# Patient Record
Sex: Female | Born: 1980 | Race: Black or African American | Hispanic: No | Marital: Single | Smoking: Former smoker
Health system: Southern US, Community
[De-identification: ages and names within clinical notes are randomized; demographics above are authoritative.]

## PROBLEM LIST (undated history)

## (undated) DIAGNOSIS — M329 Systemic lupus erythematosus, unspecified: Secondary | ICD-10-CM

## (undated) DIAGNOSIS — E119 Type 2 diabetes mellitus without complications: Secondary | ICD-10-CM

## (undated) DIAGNOSIS — F329 Major depressive disorder, single episode, unspecified: Secondary | ICD-10-CM

## (undated) DIAGNOSIS — N289 Disorder of kidney and ureter, unspecified: Secondary | ICD-10-CM

## (undated) DIAGNOSIS — E785 Hyperlipidemia, unspecified: Secondary | ICD-10-CM

## (undated) DIAGNOSIS — I10 Essential (primary) hypertension: Secondary | ICD-10-CM

## (undated) DIAGNOSIS — M1990 Unspecified osteoarthritis, unspecified site: Secondary | ICD-10-CM

## (undated) DIAGNOSIS — N83209 Unspecified ovarian cyst, unspecified side: Secondary | ICD-10-CM

## (undated) DIAGNOSIS — F32A Depression, unspecified: Secondary | ICD-10-CM

## (undated) DIAGNOSIS — T7840XA Allergy, unspecified, initial encounter: Secondary | ICD-10-CM

## (undated) DIAGNOSIS — N938 Other specified abnormal uterine and vaginal bleeding: Secondary | ICD-10-CM

## (undated) DIAGNOSIS — N186 End stage renal disease: Secondary | ICD-10-CM

## (undated) DIAGNOSIS — G4730 Sleep apnea, unspecified: Secondary | ICD-10-CM

## (undated) DIAGNOSIS — K760 Fatty (change of) liver, not elsewhere classified: Secondary | ICD-10-CM

## (undated) DIAGNOSIS — D649 Anemia, unspecified: Secondary | ICD-10-CM

## (undated) DIAGNOSIS — J189 Pneumonia, unspecified organism: Secondary | ICD-10-CM

## (undated) DIAGNOSIS — F419 Anxiety disorder, unspecified: Secondary | ICD-10-CM

## (undated) DIAGNOSIS — IMO0002 Reserved for concepts with insufficient information to code with codable children: Secondary | ICD-10-CM

## (undated) DIAGNOSIS — I739 Peripheral vascular disease, unspecified: Secondary | ICD-10-CM

## (undated) DIAGNOSIS — K219 Gastro-esophageal reflux disease without esophagitis: Secondary | ICD-10-CM

## (undated) HISTORY — PX: OTHER SURGICAL HISTORY: SHX169

## (undated) HISTORY — DX: Sleep apnea, unspecified: G47.30

## (undated) HISTORY — DX: Allergy, unspecified, initial encounter: T78.40XA

## (undated) HISTORY — DX: Other specified abnormal uterine and vaginal bleeding: N93.8

## (undated) HISTORY — DX: Unspecified ovarian cyst, unspecified side: N83.209

## (undated) HISTORY — DX: Major depressive disorder, single episode, unspecified: F32.9

## (undated) HISTORY — DX: Type 2 diabetes mellitus without complications: E11.9

## (undated) HISTORY — PX: COLONOSCOPY: SHX174

## (undated) HISTORY — DX: Gastro-esophageal reflux disease without esophagitis: K21.9

## (undated) HISTORY — DX: Anemia, unspecified: D64.9

## (undated) HISTORY — DX: Hyperlipidemia, unspecified: E78.5

## (undated) HISTORY — DX: Anxiety disorder, unspecified: F41.9

## (undated) HISTORY — PX: AV FISTULA PLACEMENT: SHX1204

## (undated) HISTORY — PX: UPPER GI ENDOSCOPY: SHX6162

## (undated) HISTORY — DX: Essential (primary) hypertension: I10

## (undated) HISTORY — DX: Depression, unspecified: F32.A

---

## 2002-04-28 ENCOUNTER — Other Ambulatory Visit: Admission: RE | Admit: 2002-04-28 | Discharge: 2002-04-28 | Payer: Self-pay | Admitting: Family Medicine

## 2005-07-27 DIAGNOSIS — I10 Essential (primary) hypertension: Secondary | ICD-10-CM

## 2005-07-27 HISTORY — DX: Essential (primary) hypertension: I10

## 2005-09-11 ENCOUNTER — Encounter: Admission: RE | Admit: 2005-09-11 | Discharge: 2005-09-11 | Payer: Self-pay | Admitting: Nephrology

## 2008-07-27 HISTORY — PX: TUBAL LIGATION: SHX77

## 2010-08-17 ENCOUNTER — Encounter: Payer: Self-pay | Admitting: Nephrology

## 2012-07-26 ENCOUNTER — Encounter (HOSPITAL_COMMUNITY): Payer: Self-pay | Admitting: *Deleted

## 2012-07-26 ENCOUNTER — Emergency Department (HOSPITAL_COMMUNITY)
Admission: EM | Admit: 2012-07-26 | Discharge: 2012-07-26 | Disposition: A | Discharge status: Home / Self Care | Payer: Medicare Other | Attending: Emergency Medicine | Admitting: Emergency Medicine

## 2012-07-26 DIAGNOSIS — Z79899 Other long term (current) drug therapy: Secondary | ICD-10-CM | POA: Insufficient documentation

## 2012-07-26 DIAGNOSIS — N186 End stage renal disease: Secondary | ICD-10-CM | POA: Insufficient documentation

## 2012-07-26 DIAGNOSIS — N259 Disorder resulting from impaired renal tubular function, unspecified: Secondary | ICD-10-CM | POA: Insufficient documentation

## 2012-07-26 DIAGNOSIS — Z8739 Personal history of other diseases of the musculoskeletal system and connective tissue: Secondary | ICD-10-CM | POA: Insufficient documentation

## 2012-07-26 HISTORY — DX: Disorder of kidney and ureter, unspecified: N28.9

## 2012-07-26 HISTORY — DX: Systemic lupus erythematosus, unspecified: M32.9

## 2012-07-26 HISTORY — DX: Reserved for concepts with insufficient information to code with codable children: IMO0002

## 2012-07-26 LAB — POCT I-STAT, CHEM 8
BUN: 95 mg/dL — ABNORMAL HIGH (ref 6–23)
Chloride: 101 mEq/L (ref 96–112)
Glucose, Bld: 86 mg/dL (ref 70–99)
Hemoglobin: 8.2 g/dL — ABNORMAL LOW (ref 12.0–15.0)
Potassium: 5 mEq/L (ref 3.5–5.1)

## 2012-07-26 NOTE — ED Notes (Signed)
Pt reports needing dialysis, last tx was Friday. Pt recently moved here and has not been set up with pcp. Denies sob. Having n/v/d this am.

## 2012-07-26 NOTE — ED Provider Notes (Signed)
History     CSN: [id number]  Arrival date & time 07/26/12  1221   First MD Initiated Contact with Patient 07/26/12 1300      No chief complaint on file.   (Consider location/radiation/quality/duration/timing/severity/associated sxs/prior treatment) HPI Comments: The patient recently moved to [place] from [place] and starting dialysis was delayed. She last dialyzed Friday, 07/22/13. She denies being short of breath, she denies chest pain, or edema. She is scheduled to started dialysis on Thursday (in 2 days).   The history is provided by the patient.    Past Medical History  Diagnosis Date  . Renal disorder   . Lupus     History reviewed. No pertinent past surgical history.  History reviewed. No pertinent family history.  History  Substance Use Topics  . Smoking status: Not on file  . Smokeless tobacco: Not on file  . Alcohol Use: No    OB History    Grav Para Term Preterm Abortions TAB SAB Ect Mult Living                  Review of Systems  Constitutional: Negative for fever.  Respiratory: Negative for shortness of breath.   Cardiovascular: Negative for chest pain and leg swelling.    Allergies  Benadryl; Vancomycin; and Adhesive  Home Medications   Current Outpatient Rx  Name  Route  Sig  Dispense  Refill  . CINACALCET HCL 60 MG PO TABS   Oral   Take 60 mg by mouth daily with supper.          Marland Kitchen SEVELAMER CARBONATE 800 MG PO TABS   Oral   Take 1,600-3,200 mg by mouth 5 (five) times daily. 2 tablets with snacks and 4 tablets with meals           BP 122/72  Pulse 76  Temp 97.6 F (36.4 C) (Oral)  Resp 14  SpO2 100%  Physical Exam  Constitutional: She is oriented to person, place, and time. She appears well-developed and well-nourished.  HENT:  Head: Normocephalic.  Neck: Normal range of motion. Neck supple.  Cardiovascular: Normal rate and regular rhythm.   Pulmonary/Chest: Effort normal and breath sounds normal.  Abdominal: Soft.  Bowel sounds are normal. There is no tenderness. There is no rebound and no guarding.  Musculoskeletal: Normal range of motion. She exhibits no edema.  Neurological: She is alert and oriented to person, place, and time.  Skin: Skin is warm and dry. No rash noted.  Psychiatric: She has a normal mood and affect.    ED Course  Procedures (including critical care time)  Labs Reviewed  POCT I-STAT, CHEM 8 - Abnormal; Notable for the following:    BUN 95 (*)     Creatinine, Ser 14.50 (*)     Calcium, Ion 1.04 (*)     Hemoglobin 8.2 (*)     HCT 24.0 (*)     All other components within normal limits   No results found.   No diagnosis found.  1. ESRD-HD  MDM  K+ is within a normal range. The patient has no symptoms indicating urgent need for dialysis. She has treatment scheduled and in encouraged to return to [facility] with any red flag symptoms.        Dewaine Oats, PA-C 07/26/12 1405

## 2012-07-26 NOTE — ED Notes (Signed)
NAD noted at time of d/c home 

## 2012-07-26 NOTE — ED Notes (Signed)
Pt is new in town and has not had dialysis since last Friday , pt has no complaints today other than one episode of n/v this morning which has resolved. Pt has no SOB and lings are clear

## 2012-07-27 ENCOUNTER — Encounter (HOSPITAL_COMMUNITY): Payer: Self-pay | Admitting: Nurse Practitioner

## 2012-07-27 ENCOUNTER — Emergency Department (HOSPITAL_COMMUNITY)
Admission: EM | Admit: 2012-07-27 | Discharge: 2012-07-27 | Disposition: A | Discharge status: Home / Self Care | Payer: Medicare Other | Attending: Emergency Medicine | Admitting: Emergency Medicine

## 2012-07-27 ENCOUNTER — Emergency Department (HOSPITAL_COMMUNITY): Payer: Medicare Other

## 2012-07-27 DIAGNOSIS — R109 Unspecified abdominal pain: Secondary | ICD-10-CM | POA: Insufficient documentation

## 2012-07-27 DIAGNOSIS — R197 Diarrhea, unspecified: Secondary | ICD-10-CM | POA: Insufficient documentation

## 2012-07-27 DIAGNOSIS — Z8739 Personal history of other diseases of the musculoskeletal system and connective tissue: Secondary | ICD-10-CM | POA: Insufficient documentation

## 2012-07-27 DIAGNOSIS — R112 Nausea with vomiting, unspecified: Secondary | ICD-10-CM | POA: Insufficient documentation

## 2012-07-27 DIAGNOSIS — N19 Unspecified kidney failure: Secondary | ICD-10-CM

## 2012-07-27 DIAGNOSIS — E119 Type 2 diabetes mellitus without complications: Secondary | ICD-10-CM | POA: Insufficient documentation

## 2012-07-27 DIAGNOSIS — Z79899 Other long term (current) drug therapy: Secondary | ICD-10-CM | POA: Insufficient documentation

## 2012-07-27 DIAGNOSIS — Z87448 Personal history of other diseases of urinary system: Secondary | ICD-10-CM | POA: Insufficient documentation

## 2012-07-27 DIAGNOSIS — E875 Hyperkalemia: Secondary | ICD-10-CM

## 2012-07-27 DIAGNOSIS — R0602 Shortness of breath: Secondary | ICD-10-CM | POA: Insufficient documentation

## 2012-07-27 DIAGNOSIS — I10 Essential (primary) hypertension: Secondary | ICD-10-CM | POA: Insufficient documentation

## 2012-07-27 LAB — BASIC METABOLIC PANEL
GFR calc Af Amer: 2 mL/min — ABNORMAL LOW (ref >90)
GFR calc non Af Amer: 2 mL/min — ABNORMAL LOW (ref >90)

## 2012-07-27 MED ORDER — SODIUM POLYSTYRENE SULFONATE 15 GM/60ML PO SUSP
60.0000 g | Freq: Once | ORAL | Status: DC
  Filled 2012-07-27: qty 240

## 2012-07-27 NOTE — ED Notes (Signed)
Explained several times to pt the importance of taking kayexalate but pt refused.  MD [place] notified.

## 2012-07-27 NOTE — ED Notes (Signed)
Pt reports feeling "fatigued and sluggish" today. States she thinks its because she has not had dialysis since Friday 27th. Reports she feels swollen and sob today also. Pt is able to speak in full unlabored sentences, A&Ox4. Reports she was here yesterday seeking dialysis and they would not complete it here , she just moved here and does not have a new dialysis center yet

## 2012-07-27 NOTE — ED Provider Notes (Signed)
Medical screening examination/treatment/procedure(s) were performed by non-physician practitioner and as supervising physician I was immediately available for consultation/collaboration.  Teressa Lower, MD 07/27/12 [phone]

## 2012-07-27 NOTE — ED Notes (Signed)
Patient transported to X-ray 

## 2012-07-27 NOTE — ED Provider Notes (Signed)
History     CSN: [id number]  Arrival date & time 07/27/12  [id number]   First MD Initiated Contact with Patient 07/27/12 2007      Chief Complaint  Patient presents with  . Fatigue    (Consider location/radiation/quality/duration/timing/severity/associated sxs/prior treatment) HPI Comments: Patient with a history of end-stage renal disease on dialysis presents with fatigue and some mild shortness of breath. She states that she recently moved to the area and has not had dialysis since December 27 which was this past Friday. She is scheduled to have dialysis tomorrow. She feels like she needs dialysis. She feels achy and sore all over she has some crampy abdominal pain. She's had some nausea and vomiting but hasn't had any vomiting since earlier today. She's had some loose stools. She denies any urinary symptoms. She does still produce urine and has been urinating. She denies he fevers or chills. She denies any chest pain other than she had some sharp fleeting episode when she went onto the cold earlier today. She was seen here last night hoping to get her dialysis but was not felt to need emergent dialysis and was discharged to followup tomorrow for dialysis.   Past Medical History  Diagnosis Date  . Renal disorder   . Lupus   . Diabetes mellitus without complication   . Hypertension     Past Surgical History  Procedure Date  . Cesarean section     History reviewed. No pertinent family history.  History  Substance Use Topics  . Smoking status: Not on file  . Smokeless tobacco: Not on file  . Alcohol Use: No    OB History    Grav Para Term Preterm Abortions TAB SAB Ect Mult Living                  Review of Systems  Constitutional: Positive for fatigue. Negative for fever, chills and diaphoresis.  HENT: Negative for congestion, rhinorrhea and sneezing.   Eyes: Negative.   Respiratory: Positive for shortness of breath. Negative for cough and chest tightness.   Cardiovascular:  Negative for chest pain and leg swelling.  Gastrointestinal: Positive for nausea, vomiting, abdominal pain and diarrhea. Negative for blood in stool.  Genitourinary: Negative for frequency, hematuria, flank pain and difficulty urinating.  Musculoskeletal: Negative for back pain and arthralgias.  Skin: Negative for rash.  Neurological: Negative for dizziness, speech difficulty, weakness, numbness and headaches.    Allergies  Benadryl; Vancomycin; and Adhesive  Home Medications   Current Outpatient Rx  Name  Route  Sig  Dispense  Refill  . CINACALCET HCL 60 MG PO TABS   Oral   Take 60 mg by mouth daily with supper.          Marland Kitchen SEVELAMER CARBONATE 800 MG PO TABS   Oral   Take 1,600-3,200 mg by mouth 5 (five) times daily. 2 tablets with snacks and 4 tablets with meals           BP 139/73  Pulse 77  Temp 98.2 F (36.8 C) (Oral)  Resp 22  SpO2 100%  Physical Exam  Constitutional: She is oriented to person, place, and time. She appears well-developed and well-nourished.       Patient is well-appearing sitting up in the bed with no acute distress  HENT:  Head: Normocephalic and atraumatic.  Eyes: Pupils are equal, round, and reactive to light.  Neck: Normal range of motion. Neck supple.  Cardiovascular: Normal rate, regular rhythm and normal heart sounds.  Pulmonary/Chest: Effort normal and breath sounds normal. No respiratory distress. She has no wheezes. She has no rales. She exhibits no tenderness.  Abdominal: Soft. Bowel sounds are normal. There is tenderness (Mild tenderness in the left midabdomen). There is no rebound and no guarding.  Musculoskeletal: Normal range of motion. She exhibits no edema.  Lymphadenopathy:    She has no cervical adenopathy.  Neurological: She is alert and oriented to person, place, and time.  Skin: Skin is warm and dry. No rash noted.  Psychiatric: She has a normal mood and affect.    ED Course  Procedures (including critical care  time)  Results for orders placed during the hospital encounter of [id number]  BASIC METABOLIC PANEL      Component Value Range   Sodium 138  135 - 145 mEq/L   Potassium 6.1 (*) 3.5 - 5.1 mEq/L   Chloride 99  96 - 112 mEq/L   CO2 24  19 - 32 mEq/L   Glucose, Bld 106 (*) 70 - 99 mg/dL   BUN 89 (*) 6 - 23 mg/dL   Creatinine, Ser 19.53 (*) 0.50 - 1.10 mg/dL   Calcium 8.9  8.4 - 10.5 mg/dL   GFR calc non Af Amer 2 (*) >90 mL/min   GFR calc Af Amer 2 (*) >90 mL/min   Dg Chest 2 View  07/27/2012  *RADIOLOGY REPORT*  Clinical Data: Short of breath, mid upper abdominal pain  CHEST - 2 VIEW  Comparison: None.  Findings: Metallic stents project over the left brachiocephalic, subclavian and axillary veins.  Mild cardiomegaly and pulmonary venous congestion.  There is central airway thickening and peribronchial cuffing without evidence of focal airspace consolidation.  No overt edema.  No pneumothorax or pleural effusion.  No acute osseous abnormality.  IMPRESSION:  1.  Mild cardiomegaly and pulmonary vascular congestion 2.  Central airway thickening and peribronchial cuffing as can be seen in both acute and chronic bronchitis as well as in inflammatory conditions such as asthma 3.  Metallic stents in the left brachiocephalic, subclavian and axillary veins   Original Report Authenticated By: Jacqulynn Cadet, M.D.      Date: 07/27/2012  Rate: 77  Rhythm: normal sinus rhythm  QRS Axis: normal  Intervals: normal  ST/T Wave abnormalities: normal  Conduction Disutrbances:none  Narrative Interpretation:   Old EKG Reviewed: none available     1. Renal failure   2. Hyperkalemia       MDM  Patient with end-stage renal disease his missed dialysis. She feels like she's feeling worse because she hasn't had dialysis. Her potassium is increased since her last visit yesterday. I discussed the findings with the nephrologist on call who feels that the patient can still go home and have dialysis tomorrow. He  is advised to give her 30 g of Kayexalate tonight and then have her take 30 g in the morning. Advised her to make sure that she keeps her appointment tomorrow and to return here if her symptoms worsen.        Malvin Johns, MD 07/27/12 2232

## 2012-10-12 ENCOUNTER — Other Ambulatory Visit (HOSPITAL_COMMUNITY): Payer: Self-pay | Admitting: Nephrology

## 2012-10-12 DIAGNOSIS — N186 End stage renal disease: Secondary | ICD-10-CM

## 2012-10-20 ENCOUNTER — Other Ambulatory Visit (HOSPITAL_COMMUNITY): Payer: Self-pay | Admitting: Nephrology

## 2012-10-20 ENCOUNTER — Ambulatory Visit (HOSPITAL_COMMUNITY)
Admission: RE | Admit: 2012-10-20 | Discharge: 2012-10-20 | Disposition: A | Discharge status: Home / Self Care | Payer: Medicare Other | Source: Ambulatory Visit | Attending: Nephrology | Admitting: Nephrology

## 2012-10-20 ENCOUNTER — Ambulatory Visit (HOSPITAL_COMMUNITY): Admission: RE | Admit: 2012-10-20 | Payer: Medicare Other | Source: Ambulatory Visit

## 2012-10-20 DIAGNOSIS — E119 Type 2 diabetes mellitus without complications: Secondary | ICD-10-CM | POA: Insufficient documentation

## 2012-10-20 DIAGNOSIS — Z881 Allergy status to other antibiotic agents status: Secondary | ICD-10-CM | POA: Insufficient documentation

## 2012-10-20 DIAGNOSIS — T82898A Other specified complication of vascular prosthetic devices, implants and grafts, initial encounter: Secondary | ICD-10-CM | POA: Insufficient documentation

## 2012-10-20 DIAGNOSIS — I120 Hypertensive chronic kidney disease with stage 5 chronic kidney disease or end stage renal disease: Secondary | ICD-10-CM | POA: Insufficient documentation

## 2012-10-20 DIAGNOSIS — Y832 Surgical operation with anastomosis, bypass or graft as the cause of abnormal reaction of the patient, or of later complication, without mention of misadventure at the time of the procedure: Secondary | ICD-10-CM | POA: Insufficient documentation

## 2012-10-20 DIAGNOSIS — N186 End stage renal disease: Secondary | ICD-10-CM

## 2012-10-20 DIAGNOSIS — M329 Systemic lupus erythematosus, unspecified: Secondary | ICD-10-CM | POA: Insufficient documentation

## 2012-10-20 DIAGNOSIS — Z9109 Other allergy status, other than to drugs and biological substances: Secondary | ICD-10-CM | POA: Insufficient documentation

## 2012-10-20 DIAGNOSIS — Z888 Allergy status to other drugs, medicaments and biological substances status: Secondary | ICD-10-CM | POA: Insufficient documentation

## 2012-10-20 MED ORDER — IOHEXOL 300 MG/ML  SOLN
100.0000 mL | Freq: Once | INTRAMUSCULAR | Status: AC | PRN
  Administered 2012-10-20: 1 mL via INTRAVENOUS

## 2012-10-20 NOTE — H&P (Signed)
Cindy Luna is an 32 y.o. female.   Chief Complaint: Left hand swelling HPI: ESRD with left upper extremity fistula for hemodialysis.  Multiple endovascular interventions in [place], [place].  Left upper extremity fistulogram demonstrated multiple stents in left central venous system.  There is at least one focal area of intra-stent stenosis.  Past Medical History  Diagnosis Date  . Renal disorder   . Lupus   . Diabetes mellitus without complication   . Hypertension     Past Surgical History  Procedure Laterality Date  . Cesarean section      No family history on file. Social History:  reports that she does not drink alcohol or use illicit drugs. Her tobacco history is not on file.  Allergies:  Allergies  Allergen Reactions  . Benadryl (Diphenhydramine Hcl) Hives  . Vancomycin Swelling  . Adhesive (Tape) Rash     Review of Systems  Constitutional: Negative.   Respiratory: Negative.   Cardiovascular: Negative.     Last menstrual period 10/20/2012. Physical Exam  Cardiovascular: Normal rate, regular rhythm and normal heart sounds.   Respiratory: Effort normal and breath sounds normal.     Assessment/Plan ESRD with left arm swelling. 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  Fistulogram demonstrates at least one area of intra-stent stenosis.  Discussed balloon angioplasty and stent placement with patient.  Patient is not a candidate for sedation because she does not have a ride home.  Informed consent obtained and plan for intervention without sedation.    Cindy Luna RYAN 10/20/2012, 10:53 AM

## 2012-10-20 NOTE — Procedures (Signed)
Fistulogram demonstrated intra-stent stenosis in the left central veins.  Balloon angioplasty was performed with 7 mm, 9 mm and 10 mm balloons.  Improved flow through stents after angioplasty.  No immediate complication.

## 2014-11-14 ENCOUNTER — Ambulatory Visit
Admit: 2014-11-14 | Disposition: A | Discharge status: Home / Self Care | Payer: Self-pay | Attending: Vascular Surgery | Admitting: Vascular Surgery

## 2014-11-19 DIAGNOSIS — M329 Systemic lupus erythematosus, unspecified: Secondary | ICD-10-CM | POA: Insufficient documentation

## 2014-11-19 DIAGNOSIS — N186 End stage renal disease: Secondary | ICD-10-CM | POA: Insufficient documentation

## 2014-11-19 DIAGNOSIS — IMO0002 Reserved for concepts with insufficient information to code with codable children: Secondary | ICD-10-CM | POA: Insufficient documentation

## 2014-11-19 DIAGNOSIS — Z992 Dependence on renal dialysis: Secondary | ICD-10-CM | POA: Insufficient documentation

## 2014-11-25 NOTE — Op Note (Signed)
PATIENT NAME:  Cindy Luna, BANDO MR#:  [id number] DATE OF BIRTH:  [birth year]  DATE OF PROCEDURE:  11/14/2014  PREOPERATIVE DIAGNOSES:  1. Complication of dialysis access with tender aneurysmal deterioration of the fistula.  2. Superior vena cava syndrome.  3. Complication of vascular device with fracture and occlusion of central venous stent.  4. End-stage renal disease.  5. Morbid obesity.   POSTOPERATIVE DIAGNOSES:   1. Complication of dialysis access with tender aneurysmal deterioration of the fistula.  2. Superior vena cava syndrome.  3. Complication of vascular device with fracture and occlusion of central venous stent.  4. End-stage renal disease.  5. Morbid obesity.   PROCEDURES PERFORMED:  1. Contrast injection of the left brachiocephalic fistula with central venography.  2. Introduction catheter into right jugular vein, first order venous catheter placement.   SURGEON:  Katha Cabal, MD.     SEDATION:  Versed plus fentanyl.   CONTRAST USED:  Isovue 25 mL.   FLUOROSCOPY TIME:  7.3 minutes.   INDICATIONS:  [name] is a 34 year old woman who presented to the office for evaluation of her fistula which has become increasingly painful.  It has substantial aneurysmal deterioration. I have recommended angiography for evaluation and possible intervention.  Risks and benefits are reviewed.  All questions answered.  The patient agrees to proceed.   DESCRIPTION OF PROCEDURE:  The patient is taken to the special procedure suite, placed in the supine position, and her left arm is extended palm upward.  The left arm is prepped and draped in sterile fashion.  Prior to the procedure, she has been given 10 mg of Versed p.o.  They were unable to obtain an IV.   Lidocaine 1% is infiltrated into the skin overlying the fistula and access to the fistula is obtained with a microneedle, microwire followed by microsheath, J-wire followed by a 6-French sheath. Subsequently 1 mg of Versed plus 50  mcg of fentanyl were administered.  This yielded wonderful sedation.   Hand injection of contrast is then used to demonstrate the fistula itself as well as central veins; however, the central venous anatomy is not well documented given the large aneurysmal areas and the dilution of the contrast.  Therefore, a Glidewire and a KMP catheter are negotiated first to the stent at the confluence of the cephalic and subclavian vein and then to the innominate stent and through the innominate stent into the SVC.  Serial imaging is performed with hand injection at all these levels.     Magnified imaging of the stents is also done.  The innominate stent is noted to be subtotally occluded but also to have a rather extensive fracture with significant deformity of the stent itself.   Having documented the left-sided central venous anatomy as well as the anatomy of the fistula,  concern regarding preserving future dialysis access is raised and therefore using the Kumpe catheter and an Advantage wire, the wire is negotiated up into the jugular to the level of the angle of the jaw, and subsequently a straight Glidecatheter is tracked over this wire to this level.  With the tip of the catheter at the level of the angle of the jaw on the right side, hand injection of contrast is used to demonstrate the jugular system as well as the right innominate system.  Once these have been imaged, the catheter is removed.  Pursestring suture is placed around the sheath. Sheath is removed.  There are no immediate complications.   INTERPRETATION:  The  fistula is noted to have profoundly large aneurysms measuring 4-6 mm in diameter; however, they are patent.  There are no strictures or stenoses noted.  There is extensive collateral formation up at the base of the neck.  There is a previously placed stent at the confluence which demonstrates a greater than 90% narrowing.  A short segment more proximally of the subclavian is noted to be patent.   Then there are 2 stents which extend into the proximal subclavian and the innominate.  The stent protrudes into the superior vena cava slightly as well. On closer examination, there is a subtotal occlusion of the stent and there is fracture of the stent with gross deformity.   The jugular vein on the right side is imaged beginning at the level of the angle of the jaw and is noted to be approximately 8-10 mm in diameter and widely patent, filling a patent right innominate.  There is ample room within the superior vena cava to negotiate around the protruding left innominate stent.  Superior vena cava is otherwise widely patent, filling the atrium in the usual fashion.   SUMMARY:   1. Profound central venous stenosis with fracture and gross deformity of the existing stents.  2. Patency of the right jugular and innominate vein.  3. Patency of the left arm brachiocephalic fistula with very large aneurysmal deterioration.  4. There are multiple possibilities for reconstruction and/or dialysis access.  She certainly is able to have a HeRO graft on the right via the jugular approach.  The right subclavian appeared to be occluded.  Alternatively, she could have a HeRO graft through the existing stents married to her fistula in a hybrid-type situation.  This would provide excellent flow and decompress her arm from the venous hypertension.  The areas of concern could be re-stented but given the fracture and deformity, I do not think this is a viable long-term solution.  She will follow up with me in the office.    ____________________________ Katha Cabal, MD ggs:kc D: 11/14/2014 14:15:16 ET T: 11/14/2014 17:20:52 ET JOB#: [id number]  cc: Katha Cabal, MD, <Dictator> Katha Cabal MD ELECTRONICALLY SIGNED 11/20/2014 12:48

## 2014-12-10 DIAGNOSIS — G4733 Obstructive sleep apnea (adult) (pediatric): Secondary | ICD-10-CM | POA: Insufficient documentation

## 2015-02-01 ENCOUNTER — Encounter (HOSPITAL_COMMUNITY): Payer: Self-pay

## 2015-02-01 ENCOUNTER — Other Ambulatory Visit (HOSPITAL_COMMUNITY)
Admission: RE | Admit: 2015-02-01 | Discharge: 2015-02-01 | Disposition: A | Discharge status: Home / Self Care | Payer: Medicare Other | Source: Ambulatory Visit | Attending: Family Medicine | Admitting: Family Medicine

## 2015-02-01 ENCOUNTER — Emergency Department (INDEPENDENT_AMBULATORY_CARE_PROVIDER_SITE_OTHER)
Admission: EM | Admit: 2015-02-01 | Discharge: 2015-02-01 | Disposition: A | Discharge status: Home / Self Care | Payer: Medicare Other | Source: Home / Self Care

## 2015-02-01 DIAGNOSIS — N760 Acute vaginitis: Secondary | ICD-10-CM | POA: Insufficient documentation

## 2015-02-01 DIAGNOSIS — Z113 Encounter for screening for infections with a predominantly sexual mode of transmission: Secondary | ICD-10-CM | POA: Diagnosis present

## 2015-02-01 DIAGNOSIS — B9689 Other specified bacterial agents as the cause of diseases classified elsewhere: Secondary | ICD-10-CM

## 2015-02-01 DIAGNOSIS — A499 Bacterial infection, unspecified: Secondary | ICD-10-CM

## 2015-02-01 HISTORY — DX: End stage renal disease: N18.6

## 2015-02-01 LAB — POCT URINALYSIS DIP (DEVICE)
Bilirubin Urine: NEGATIVE
Glucose, UA: NEGATIVE mg/dL
Ketones, ur: NEGATIVE mg/dL
Nitrite: NEGATIVE
Protein, ur: >=300 mg/dL — AB
Specific Gravity, Urine: 1.02 (ref 1.005–1.030)
Urobilinogen, UA: 0.2 mg/dL (ref 0.0–1.0)
pH: 8.5 — ABNORMAL HIGH (ref 5.0–8.0)

## 2015-02-01 LAB — URINALYSIS, ROUTINE W REFLEX MICROSCOPIC
Bilirubin Urine: NEGATIVE
Glucose, UA: NEGATIVE mg/dL
Hgb urine dipstick: NEGATIVE
Ketones, ur: NEGATIVE mg/dL
Nitrite: NEGATIVE
Protein, ur: 100 mg/dL — AB
Specific Gravity, Urine: 1.01 (ref 1.005–1.030)
Urobilinogen, UA: 0.2 mg/dL (ref 0.0–1.0)
pH: 8.5 — ABNORMAL HIGH (ref 5.0–8.0)

## 2015-02-01 LAB — POCT PREGNANCY, URINE: Preg Test, Ur: NEGATIVE

## 2015-02-01 LAB — URINE MICROSCOPIC-ADD ON

## 2015-02-01 MED ORDER — METRONIDAZOLE 500 MG PO TABS: 500.0000 mg | ORAL_TABLET | Freq: Two times a day (BID) | ORAL | Status: DC

## 2015-02-01 NOTE — ED Provider Notes (Addendum)
CSN: [id number]     Arrival date & time 02/01/15  1500 History   First MD Initiated Contact with Patient 02/01/15 1536     Chief Complaint  Patient presents with  . SEXUALLY TRANSMITTED DISEASE   (Consider location/radiation/quality/duration/timing/severity/associated sxs/prior Treatment) Patient is a 34 y.o. female presenting with vaginal discharge. The history is provided by the patient. No language interpreter was used.  Vaginal Discharge Quality:  Malodorous Onset quality:  Gradual Duration:  3 days Timing:  Constant Progression:  Worsening Chronicity:  New Context: after intercourse   Relieved by:  Nothing Worsened by:  Nothing tried Ineffective treatments:  None tried Associated symptoms: no abdominal pain, no dyspareunia, no dysuria, no fever, no genital lesions, no nausea, no rash, no urinary frequency, no urinary hesitancy, no urinary incontinence and no vaginal itching   Risk factors: new sexual partner, STI exposure and unprotected sex   Risk factors: no PID    Patient has h/o lupus Past Medical History  Diagnosis Date  . End stage renal disease   . Lupus    History reviewed. No pertinent past surgical history. History reviewed. No pertinent family history. History  Substance Use Topics  . Smoking status: Never Smoker   . Smokeless tobacco: Not on file  . Alcohol Use: Not on file   OB History    No data available     Review of Systems  Constitutional: Negative.  Negative for fever.  HENT: Negative.   Eyes: Negative.   Respiratory: Negative.   Cardiovascular: Negative.   Gastrointestinal: Negative.  Negative for nausea and abdominal pain.  Endocrine: Negative.   Genitourinary: Positive for vaginal discharge. Negative for bladder incontinence, dysuria, hesitancy and dyspareunia.  Musculoskeletal: Positive for myalgias and arthralgias.  Skin: Negative for color change.  Neurological: Negative.   Hematological: Negative.     Allergies  Review of  patient's allergies indicates not on file.  Home Medications   Prior to Admission medications   Medication Sig Start Date End Date Taking? Authorizing Provider  antiseptic oral rinse (BIOTENE) LIQD 15 mLs by Mouth Rinse route as needed for dry mouth.   Yes Historical Provider, MD  cetirizine (ZYRTEC) 10 MG tablet Take 10 mg by mouth daily.   Yes Historical Provider, MD  cinacalcet (SENSIPAR) 60 MG tablet Take 60 mg by mouth daily.   Yes Historical Provider, MD  sevelamer carbonate (RENVELA) 800 MG tablet Take 800 mg by mouth 3 (three) times daily with meals.   Yes Historical Provider, MD  metroNIDAZOLE (FLAGYL) 500 MG tablet Take 1 tablet (500 mg total) by mouth 2 (two) times daily with a meal. DO NOT CONSUME ALCOHOL WHILE TAKING THIS MEDICATION. 02/01/15   Robyn Haber, MD   BP 162/94 mmHg  Pulse 108  Temp(Src) 98.8 F (37.1 C) (Oral)  Resp 18  SpO2 95%  LMP 12/31/2014 (Exact Date) Physical Exam  Constitutional: She appears well-developed and well-nourished.  HENT:  Head: Normocephalic and atraumatic.  Right Ear: External ear normal.  Left Ear: External ear normal.  Mouth/Throat: Oropharynx is clear and moist.  Eyes: Conjunctivae and EOM are normal. Pupils are equal, round, and reactive to light.  Neck: Normal range of motion. Neck supple.  Cardiovascular: Normal rate.   Pulmonary/Chest: Effort normal.  Abdominal: Soft.  Genitourinary: Vagina normal and uterus normal. No vaginal discharge found.  Morbidly obese woman, pelvic done with difficulty.  No cervical motion tenderness, no pelvic tenderness or masses  Nursing note and vitals reviewed.   ED Course  Procedures (including critical care time) Labs Review Labs Reviewed  POCT URINALYSIS DIP (DEVICE) - Abnormal; Notable for the following:    Hgb urine dipstick TRACE (*)    pH 8.5 (*)    Protein, ur >=300 (*)    Leukocytes, UA SMALL (*)    All other components within normal limits  WET PREP, GENITAL  URINE CULTURE   URINALYSIS, ROUTINE W REFLEX MICROSCOPIC (NOT AT [facility])  HIV ANTIBODY (ROUTINE TESTING)  RPR  POCT PREGNANCY, URINE  GC/CHLAMYDIA PROBE AMP ([place]) NOT AT [facility]   Neg pregnancy test Imaging Review No results found.   MDM  Patient's symptoms and findings are most consistent with BV.  We'll run a urine culture and do other STD testing, but for now, Flagyl is only treatment.  Robyn Haber, MD    Robyn Haber, MD 02/01/15 [id number]  Robyn Haber, MD 02/02/15 1800

## 2015-02-01 NOTE — Discharge Instructions (Signed)
Your exam is consistent with bacterial vaginitis, a common mild infection.  No sign of STD.  We will notify you if any STD tests are positive in the next few days Bacterial Vaginosis Bacterial vaginosis is a vaginal infection that occurs when the normal balance of bacteria in the vagina is disrupted. It results from an overgrowth of certain bacteria. This is the most common vaginal infection in women of childbearing age. Treatment is important to prevent complications, especially in pregnant women, as it can cause a premature delivery. CAUSES  Bacterial vaginosis is caused by an increase in harmful bacteria that are normally present in smaller amounts in the vagina. Several different kinds of bacteria can cause bacterial vaginosis. However, the reason that the condition develops is not fully understood. RISK FACTORS Certain activities or behaviors can put you at an increased risk of developing bacterial vaginosis, including:  Having a new sex partner or multiple sex partners.  Douching.  Using an intrauterine device (IUD) for contraception. Women do not get bacterial vaginosis from toilet seats, bedding, swimming pools, or contact with objects around them. SIGNS AND SYMPTOMS  Some women with bacterial vaginosis have no signs or symptoms. Common symptoms include:  Grey vaginal discharge.  A fishlike odor with discharge, especially after sexual intercourse.  Itching or burning of the vagina and vulva.  Burning or pain with urination. DIAGNOSIS  Your health care provider will take a medical history and examine the vagina for signs of bacterial vaginosis. A sample of vaginal fluid may be taken. Your health care provider will look at this sample under a microscope to check for bacteria and abnormal cells. A vaginal pH test may also be done.  TREATMENT  Bacterial vaginosis may be treated with antibiotic medicines. These may be given in the form of a pill or a vaginal cream. A second round of  antibiotics may be prescribed if the condition comes back after treatment.  HOME CARE INSTRUCTIONS   Only take over-the-counter or prescription medicines as directed by your health care provider.  If antibiotic medicine was prescribed, take it as directed. Make sure you finish it even if you start to feel better.  Do not have sex until treatment is completed.  Tell all sexual partners that you have a vaginal infection. They should see their health care provider and be treated if they have problems, such as a mild rash or itching.  Practice safe sex by using condoms and only having one sex partner. SEEK MEDICAL CARE IF:   Your symptoms are not improving after 3 days of treatment.  You have increased discharge or pain.  You have a fever. MAKE SURE YOU:   Understand these instructions.  Will watch your condition.  Will get help right away if you are not doing well or get worse. FOR MORE INFORMATION  Centers for Disease Control and Prevention, Division of STD Prevention: Appraiser[employer]raud[employer] American Sexual Health Association (ASHA): www.ashastd.org  Document Released: 07/13/2005 Document Revised: 05/03/2013 Document Reviewed: 02/22/2013 [facility] Patient Information 2015 [place], [place]. This information is not intended to replace advice given to you by your health care provider. Make sure you discuss any questions you have with your health care provider.

## 2015-02-01 NOTE — ED Notes (Signed)
Concern for STD. C/o odor. Menses late

## 2015-02-02 LAB — HIV ANTIBODY (ROUTINE TESTING W REFLEX): HIV Screen 4th Generation wRfx: NONREACTIVE

## 2015-02-02 LAB — RPR: RPR Ser Ql: NONREACTIVE

## 2015-02-03 LAB — URINE CULTURE: Culture: >=100000

## 2015-02-04 LAB — CERVICOVAGINAL ANCILLARY ONLY
Chlamydia: NEGATIVE
Neisseria Gonorrhea: NEGATIVE

## 2015-02-04 NOTE — ED Notes (Signed)
Final report of tests are negative. Called and left message for patinet

## 2015-02-05 LAB — CERVICOVAGINAL ANCILLARY ONLY: Wet Prep (BD Affirm): POSITIVE — AB

## 2015-03-29 ENCOUNTER — Encounter (HOSPITAL_COMMUNITY): Payer: Self-pay | Admitting: Nurse Practitioner

## 2015-04-30 ENCOUNTER — Encounter: Payer: Self-pay | Admitting: Family Medicine

## 2015-04-30 ENCOUNTER — Ambulatory Visit (INDEPENDENT_AMBULATORY_CARE_PROVIDER_SITE_OTHER): Payer: Medicare Other | Admitting: Family Medicine

## 2015-04-30 VITALS — BP 160/91 | HR 91 | Temp 98.1°F | Ht 69.5 in | Wt 323.2 lb

## 2015-04-30 DIAGNOSIS — M329 Systemic lupus erythematosus, unspecified: Secondary | ICD-10-CM | POA: Diagnosis not present

## 2015-04-30 DIAGNOSIS — IMO0002 Reserved for concepts with insufficient information to code with codable children: Secondary | ICD-10-CM

## 2015-04-30 DIAGNOSIS — N186 End stage renal disease: Secondary | ICD-10-CM | POA: Diagnosis present

## 2015-04-30 DIAGNOSIS — Z992 Dependence on renal dialysis: Secondary | ICD-10-CM

## 2015-04-30 NOTE — Assessment & Plan Note (Signed)
Is followed with Dr. 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

## 2015-04-30 NOTE — Progress Notes (Signed)
   Subjective:    Cindy Luna - 34 y.o. female MRN [id number]  Date of birth: Apr 17, 1981  HPI  Cindy Luna is here for est care.  Hx with lupus 2007. Bx of kidney in 2007.  ESRD one dialysis [facility].  Last pap was 2012  No hx of abnormal pap.  Reports getting the Tdap within the last 10 years  Denies flu vaccine today.  Prior pcp in [place] Maintenance:  Health Maintenance Due  Topic Date Due  . Samul Dada  06/29/2001  . PAP SMEAR  06/30/2003  . INFLUENZA VACCINE  02/25/2015    -  reports that she has never smoked. She does not have any smokeless tobacco history on file. - Review of Systems: Per HPI. - Past Medical History: Patient Active Problem List   Diagnosis Date Noted  . ESRD on dialysis ([place]) 04/30/2015  . Lupus ([place]) 04/30/2015   - Medications: reviewed and updated Current Outpatient Prescriptions  Medication Sig Dispense Refill  . antiseptic oral rinse (BIOTENE) LIQD 15 mLs by Mouth Rinse route as needed for dry mouth.    . cetirizine (ZYRTEC) 10 MG tablet Take 10 mg by mouth daily.    . cinacalcet (SENSIPAR) 60 MG tablet Take 60 mg by mouth daily with supper.     . sevelamer (RENVELA) 800 MG tablet Take 1,600-3,200 mg by mouth 5 (five) times daily. 2 tablets with snacks and 4 tablets with meals     No current facility-administered medications for this visit.     Review of Systems See HPI     Objective:   Physical Exam BP 160/91 mmHg  Pulse 91  Temp(Src) 98.1 F (36.7 C) (Oral)  Ht 5' 9.5" (1.765 m)  Wt 323 lb 3.2 oz (146.603 kg)  BMI 47.06 kg/m2  LMP 03/25/2015 Gen: NAD, alert, cooperative with exam, obese HEENT: NCAT,  clear conjunctiva, large neck  CV: tachycardic, regular rhythm, good S1/S2, no murmur,  Resp: CTABL, no wheezes, non-labored Abd: SNTND, BS present, no guarding or organomegaly Skin: grafts on left and right arm, thrill palpated in both,  Neuro: no gross deficits.  Psych: alert and  oriented     Assessment & Plan:   ESRD on dialysis [facility]) Is followed with Dr. 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  - refer to rheumatology

## 2015-04-30 NOTE — Patient Instructions (Signed)
Thank you for coming in,   Please schedule a pap smear in the future.  Please let us know if you decide to get the flu vaccine.   Please bring all of your medications with you to each visit.   Sign up for My Chart to have easy access to your labs results, and communication with your Primary care physician   Please feel free to call with any questions or concerns at any time, at [phone]. --Dr. Raeford Razor

## 2015-04-30 NOTE — Assessment & Plan Note (Signed)
Diagnosed in 2007  Reason for her ESRD with bx  Previously followed by rheumatology but not for some time.  - refer to rheumatology

## 2015-05-09 ENCOUNTER — Encounter: Payer: Self-pay | Admitting: Family Medicine

## 2015-05-09 ENCOUNTER — Other Ambulatory Visit (HOSPITAL_COMMUNITY)
Admission: RE | Admit: 2015-05-09 | Discharge: 2015-05-09 | Disposition: A | Discharge status: Home / Self Care | Payer: Medicare Other | Source: Ambulatory Visit | Attending: Family Medicine | Admitting: Family Medicine

## 2015-05-09 ENCOUNTER — Ambulatory Visit (INDEPENDENT_AMBULATORY_CARE_PROVIDER_SITE_OTHER): Payer: Medicare Other | Admitting: Family Medicine

## 2015-05-09 VITALS — BP 191/80 | HR 85 | Temp 97.9°F | Ht 69.5 in | Wt 316.0 lb

## 2015-05-09 DIAGNOSIS — Z992 Dependence on renal dialysis: Secondary | ICD-10-CM

## 2015-05-09 DIAGNOSIS — M329 Systemic lupus erythematosus, unspecified: Secondary | ICD-10-CM

## 2015-05-09 DIAGNOSIS — N186 End stage renal disease: Secondary | ICD-10-CM

## 2015-05-09 DIAGNOSIS — R011 Cardiac murmur, unspecified: Secondary | ICD-10-CM | POA: Diagnosis not present

## 2015-05-09 DIAGNOSIS — Z1151 Encounter for screening for human papillomavirus (HPV): Secondary | ICD-10-CM | POA: Insufficient documentation

## 2015-05-09 DIAGNOSIS — R21 Rash and other nonspecific skin eruption: Secondary | ICD-10-CM | POA: Diagnosis present

## 2015-05-09 DIAGNOSIS — Z01411 Encounter for gynecological examination (general) (routine) with abnormal findings: Secondary | ICD-10-CM | POA: Insufficient documentation

## 2015-05-09 DIAGNOSIS — N898 Other specified noninflammatory disorders of vagina: Secondary | ICD-10-CM | POA: Diagnosis not present

## 2015-05-09 DIAGNOSIS — A5901 Trichomonal vulvovaginitis: Secondary | ICD-10-CM | POA: Diagnosis not present

## 2015-05-09 DIAGNOSIS — Z124 Encounter for screening for malignant neoplasm of cervix: Secondary | ICD-10-CM

## 2015-05-09 DIAGNOSIS — Z113 Encounter for screening for infections with a predominantly sexual mode of transmission: Secondary | ICD-10-CM | POA: Diagnosis present

## 2015-05-09 LAB — POCT WET PREP (WET MOUNT): CLUE CELLS WET PREP WHIFF POC: POSITIVE

## 2015-05-09 MED ORDER — TRIAMCINOLONE ACETONIDE 0.1 % EX CREA: 1.0000 "application " | TOPICAL_CREAM | Freq: Two times a day (BID) | CUTANEOUS | Status: DC

## 2015-05-09 MED ORDER — METRONIDAZOLE 250 MG PO TABS: 250.0000 mg | ORAL_TABLET | Freq: Three times a day (TID) | ORAL | Status: DC

## 2015-05-09 NOTE — Assessment & Plan Note (Signed)
GC and chlamydia taken.  Recent neg RPR and HIV  Flagyl.  Partners need treatment.

## 2015-05-09 NOTE — Progress Notes (Signed)
Subjective:     Patient ID: Cindy Luna, female   DOB: [birth year], 34 y.o.   MRN: [id number]  HPI PMHx significant for Lupus and ESRD  NECK RASH: Patient states she has experienced a two week history of intermittently itchy rash on her neck. It became raised about a week ago. She denies rash anywhere else on her body. She denies fevers, chills, night sweats, N/V/C/D, black tarry stools, headaches, visual disturbance, or abdominal pain. She has had some chronic joint pain which is stable. 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 She is going to see a rheumatologist for her lupus next week.   VAGINAL DISCHARGE: Patient has been experiencing vaginal discharge for about a week. Her periods have been regular, she denies irritation, burning with urination, or any other vaginal symptoms. She has two sexual partners and does not use condoms.   Review of Systems  Pertinent ROS as per HPI     Objective:   Physical Exam  GEN: NAD, Well appearing HEENT: Sclerae white, PERRL, oral exam normal, no thyromegaly or lymphadenopathy palpated. Significant chronic swelling of the neck with stretch marks. CV: RRR, grade 3/4 cres/decres systolic murmur with minor diastolic component best heard at the RUSB. No rubs or gallops. Fistula looks goo with good flow. No cyanosis, clubbing or edema.  PULM: CTAB, NWOB, no w/c/r GI: s/nt/nd GU: No external lesions noted on vulva, white discharge seen at vaginal entrance. Cervix slightly erythematous with whitish - greenish discharge noted in the vaginal vault. Ovaries difficult to palpate due to body habitus, uterus normal, cervix non-tender to palpation. Pap smear performed with good visualization of the cervix.  NEURO: AOx3, good sensation, ROM, and strength in her extremities SKIN: Neck: raised, dark red, maculopapular rash with slight scale  located circumferentially around patient's neck. Significant stretch marks noted on extremities and neck.   Wet prep: Pos whiff test and clue cells, positive trichomonas  Fungal scraping of rash: Negative     Assessment and Plan:    ECZEMATOUS RASH: Rash is difficult to classify, but given negative KOH fungal scraping it is unlikely to be fungal.  - Start trial of triamcinolone - Return in 2 weeks to check for improvement   BACTERIAL VAGINOSIS AND TRICHOMONAS: Positive wet prep fro bv and trich.  - Flagyl for patient - advised patient to get partners treated and to use condoms  - G/C chlamydia sent  HEALTH MAINTENANCE:  -BP high in office but is being managed by PCP.  -Patient denies Tdap and flu shot -Pap smear performed.

## 2015-05-09 NOTE — Assessment & Plan Note (Signed)
No arthralgias or other evidence for flair.  Rash does not look like discoid lupus.

## 2015-05-09 NOTE — Assessment & Plan Note (Signed)
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

## 2015-05-09 NOTE — Assessment & Plan Note (Signed)
KOH neg.  Will treat as bad eczema.  May have photosensitivity componant. Rx with topical steroids.  If still present in 2 weeks, I would biopsy.

## 2015-05-09 NOTE — Progress Notes (Signed)
   Subjective:    Patient ID: Cindy Luna, female    DOB: 09-22-1980, 34 y.o.   MRN: [id number]  HPI I was personally present and/or repeated all elements of the H&PE as performed by [facility].   Oh my, this is a complicated work in.  Because I had time and the problems were pressing, we addressed them. Issues: 1. Rash for several weeks, worsening.  Pruritic.  Localized around neck.  Hx of lupus 2. Vag discharge.  States that she is at risk for STD.  Hx of bacterial vaginosis.  No abd pain or urinary symptoms. 3. ESRD on dialysis.  Pseudohypertension. Managed by renal.  They do no think that ankle BP readings are correct. 4. Recent stent where? Left subclavion from previous catheter fistula?  I am a little unclear.    Review of Systems     Objective:   Physical ExamNeck: dry scale rash with raised areas and some hypopigmentation. Lungs clear Cardiac - both systolic and diastolic murmur Abd benign Pelvic vag discharge.  Pap taken.  Cervix visually normal No bimanual tenderness        Assessment & Plan:

## 2015-05-09 NOTE — Assessment & Plan Note (Signed)
I am concerned about diastolic murmur.  Will get echo.  May be subclavian stenosis

## 2015-05-09 NOTE — Patient Instructions (Addendum)
Thank you for coming in today!  You have bacterial vaginosis and trichomoniasis. We are treating you with flagyl.  GET YOUR PARTNERS TREATED FOR TRICHOMONIASIS. THEY WILL NEED TO SEE DOCTORS ON THEIR OWN TO GET FLAGYL.  For your rash, we are treating you with a steroid cream. Come back in 2 weeks so we can check for improvement or to see if we need to do any other studies. Think about getting your flu shot and Tetanus shot at your next visit.  They will call you to set up the echocardiogram of your heart because of the murmur we heard.   Thank you again.   Trichomoniasis Trichomoniasis is an infection caused by an organism called Trichomonas. The infection can affect both women and men. In women, the outer female genitalia and the vagina are affected. In men, the penis is mainly affected, but the prostate and other reproductive organs can also be involved. Trichomoniasis is a sexually transmitted infection (STI) and is most often passed to another person through sexual contact.  RISK FACTORS  Having unprotected sexual intercourse.  Having sexual intercourse with an infected partner. SIGNS AND SYMPTOMS  Symptoms of trichomoniasis in women include:  Abnormal gray-green frothy vaginal discharge.  Itching and irritation of the vagina.  Itching and irritation of the area outside the vagina. Symptoms of trichomoniasis in men include:   Penile discharge with or without pain.  Pain during urination. This results from inflammation of the urethra. DIAGNOSIS  Trichomoniasis may be found during a Pap test or physical exam. Your health care provider may use one of the following methods to help diagnose this infection:  Testing the pH of the vagina with a test tape.  Using a vaginal swab test that checks for the Trichomonas organism. A test is available that provides results within a few minutes.  Examining a urine sample.  Testing vaginal secretions. Your health care provider may test you  for other STIs, including HIV. TREATMENT   You may be given medicine to fight the infection. Women should inform their health care provider if they could be or are pregnant. Some medicines used to treat the infection should not be taken during pregnancy.  Your health care provider may recommend over-the-counter medicines or creams to decrease itching or irritation.  Your sexual partner will need to be treated if infected.  Your health care provider may test you for infection again 3 months after treatment. HOME CARE INSTRUCTIONS   Take medicines only as directed by your health care provider.  Take over-the-counter medicine for itching or irritation as directed by your health care provider.  Do not have sexual intercourse while you have the infection.  Women should not douche or wear tampons while they have the infection.  Discuss your infection with your partner. Your partner may have gotten the infection from you, or you may have gotten it from your partner.  Have your sex partner get examined and treated if necessary.  Practice safe, informed, and protected sex.  See your health care provider for other STI testing. SEEK MEDICAL CARE IF:   You still have symptoms after you finish your medicine.  You develop abdominal pain.  You have pain when you urinate.  You have bleeding after sexual intercourse.  You develop a rash.  Your medicine makes you sick or makes you throw up (vomit). MAKE SURE YOU:  Understand these instructions.  Will watch your condition.  Will get help right away if you are not doing well or  get worse.   This information is not intended to replace advice given to you by your health care provider. Make sure you discuss any questions you have with your health care provider.   Document Released: 01/06/2001 Document Revised: 08/03/2014 Document Reviewed: 04/24/2013 Elsevier Interactive Patient Education [employer].

## 2015-05-10 ENCOUNTER — Telehealth: Payer: Self-pay

## 2015-05-10 LAB — CERVICOVAGINAL ANCILLARY ONLY
Chlamydia: NEGATIVE
NEISSERIA GONORRHEA: NEGATIVE

## 2015-05-10 NOTE — Telephone Encounter (Signed)
Called and spoke to pt. Gave her the neg result of the GC/Chlamydia test. Ottis Stain, CMA

## 2015-05-10 NOTE — Telephone Encounter (Signed)
-----   Message from Kinnie Feil, MD sent at 05/10/2015  4:07 PM EDT ----- Please inform patient her GC/Chlamydia test done by Dr > Hensel was negative.

## 2015-05-13 LAB — CYTOLOGY - PAP

## 2015-05-21 ENCOUNTER — Ambulatory Visit (HOSPITAL_COMMUNITY): Payer: Medicare Other | Attending: Family Medicine

## 2015-05-21 ENCOUNTER — Other Ambulatory Visit: Payer: Self-pay

## 2015-05-21 DIAGNOSIS — E669 Obesity, unspecified: Secondary | ICD-10-CM | POA: Diagnosis not present

## 2015-05-21 DIAGNOSIS — Z6841 Body Mass Index (BMI) 40.0 and over, adult: Secondary | ICD-10-CM | POA: Insufficient documentation

## 2015-05-21 DIAGNOSIS — R011 Cardiac murmur, unspecified: Secondary | ICD-10-CM | POA: Diagnosis present

## 2015-05-21 DIAGNOSIS — I340 Nonrheumatic mitral (valve) insufficiency: Secondary | ICD-10-CM | POA: Diagnosis not present

## 2015-05-24 ENCOUNTER — Ambulatory Visit (INDEPENDENT_AMBULATORY_CARE_PROVIDER_SITE_OTHER): Payer: Medicare Other | Admitting: Family Medicine

## 2015-05-24 VITALS — BP 138/86 | HR 105 | Temp 98.2°F | Ht 70.0 in | Wt 316.3 lb

## 2015-05-24 DIAGNOSIS — R21 Rash and other nonspecific skin eruption: Secondary | ICD-10-CM | POA: Diagnosis not present

## 2015-05-24 DIAGNOSIS — N898 Other specified noninflammatory disorders of vagina: Secondary | ICD-10-CM | POA: Diagnosis present

## 2015-05-24 LAB — POCT WET PREP (WET MOUNT): CLUE CELLS WET PREP WHIFF POC: NEGATIVE

## 2015-05-24 MED ORDER — FLUCONAZOLE 150 MG PO TABS: 150.0000 mg | ORAL_TABLET | Freq: Once | ORAL | Status: DC

## 2015-05-24 NOTE — Patient Instructions (Signed)
Thank you for coming to see me today. It was a pleasure. Today we talked about:   Discharge: I will treat you for a presumed yeast infection. Please take Fluconazole 150mg  once  Rash: I will refer you to the dermatologist per the rheumatologist request.  If you have any questions or concerns, please do not hesitate to call the office at [phone].  Sincerely,  Cordelia Poche, MD

## 2015-05-24 NOTE — Progress Notes (Addendum)
    Subjective   Cindy Luna is a 34 y.o. female that presents for a same day visit  1. Rash on neck: This was present at a recent visit. Patient states the Triamcinolone initially improved the rash but then the rash became "raw." She describes the rash as burning especially when garments touch her neck. No new detergents. She has been moisturizing with Eucerin, Shea butter and Vaseline. Patient would like referral to dermatology.  2. Vaginal discharge: patient was recently treated for BV and trichomonas. She states she has finished the antibiotic. She has not had sexual intercourse since last visit. No irritation.  ROS Per HPI  Social History  Substance Use Topics  . Smoking status: Never Smoker   . Smokeless tobacco: Not on file  . Alcohol Use: No    Allergies  Allergen Reactions  . Benadryl [Diphenhydramine Hcl] Hives  . Doxycycline Hyclate   . Vancomycin Swelling  . Vancomycin   . Adhesive [Tape] Rash    Objective   BP 138/86 mmHg  Pulse 105  Temp(Src) 98.2 F (36.8 C) (Oral)  Ht 5\' 10"  (1.778 m)  Wt 316 lb 4.8 oz (143.473 kg)  BMI 45.38 kg/m2  SpO2 97%  LMP 05/01/2015  General: Well appearing, no distress Genitourinary: Labia appear normal. Vagina with thick milky discharge. Cervix appears normal. Could not assess odor. Wet prep obtained. Skin: Macular rash along neck in a semicircular fashion around neck. Rash is non-tender and non-erythematous.  Assessment and Plan   Meds ordered this encounter  Medications  . fluconazole (DIFLUCAN) 150 MG tablet    Sig: Take 1 tablet (150 mg total) by mouth once.    Dispense:  1 tablet    Refill:  0   Rash and nonspecific skin eruption Rash persistent with treatment. Will refer to dermatology for biopsy.    Candida vaginalis: wet prep negative but patient's symptoms consistent, especially with recent antibiotic use. Symptoms are bothersome enough for patient that she would like to be treated  Fluconazole 150mg   x1

## 2015-05-26 NOTE — Assessment & Plan Note (Signed)
Rash persistent with treatment. Will refer to dermatology for biopsy.

## 2015-06-24 ENCOUNTER — Telehealth: Payer: Self-pay | Admitting: Family Medicine

## 2015-06-24 DIAGNOSIS — R21 Rash and other nonspecific skin eruption: Secondary | ICD-10-CM

## 2015-06-24 NOTE — Telephone Encounter (Signed)
Pt called because she needs a referral to [place]. She said that she called them and they will take her insurance but the do want a referral from [place]. JW

## 2015-11-12 ENCOUNTER — Ambulatory Visit (INDEPENDENT_AMBULATORY_CARE_PROVIDER_SITE_OTHER): Payer: Medicare Other | Admitting: Family Medicine

## 2015-11-12 ENCOUNTER — Encounter: Payer: Self-pay | Admitting: Family Medicine

## 2015-11-12 VITALS — BP 130/68 | HR 97 | Temp 98.4°F

## 2015-11-12 DIAGNOSIS — L03115 Cellulitis of right lower limb: Secondary | ICD-10-CM | POA: Diagnosis present

## 2015-11-12 MED ORDER — CLINDAMYCIN HCL 300 MG PO CAPS: 300.0000 mg | ORAL_CAPSULE | Freq: Three times a day (TID) | ORAL | Status: DC

## 2015-11-12 NOTE — Patient Instructions (Signed)
Thank you for coming in,   Please stop taking the Keflex.  I am starting you on clindamycin   Please have a doctor look at it tomorrow at the dialysis unit.   Please go to the hospital if there is worsening of the redness.   Please bring all of your medications with you to each visit.   Sign up for My Chart to have easy access to your labs results, and communication with your Primary care physician   Please feel free to call with any questions or concerns at any time, at [phone]. --Dr. Raeford Razor

## 2015-11-12 NOTE — Progress Notes (Signed)
   Subjective:    Cindy Luna - 35 y.o. female MRN [id number]  Date of birth: 1980/10/02  CC cellulitis   HPI  Cindy Luna is here for Cellulitis.  Symptoms started on Sunday  She was at a family event and went to a hospital in [place].  She was diagnosed with cellulitis and started on keflex.  It is staying the same since being on the antibiotic  Was evaluated with venous duplex and negative for clot  Denies any fevers  Pain is worse with standing on her right foot and having to walk with crutches.  Pain is sheering in nature   She goes for HD on M,W, and Friday  There was no doctor at the session yesterday.   PMH: ESRD on HD, Lupus    Health Maintenance:  Health Maintenance Due  Topic Date Due  . TETANUS/TDAP  06/29/2001    Review of Systems See HPI     Objective:   Physical Exam BP 130/68 mmHg  Pulse 97  Temp(Src) 98.4 F (36.9 C) (Oral)  Wt   SpO2 98% Gen: NAD, alert, cooperative with exam,  Skin: warmth, erythema and swelling of her right foot. Significant tenderness to palpation starts in her right foot and extends proximally to her mid tibia.   Neuro: no gross deficits.     Assessment & Plan:   Cellulitis Her right foot is swollen so want to keep in mind abscess formation  - change keflex to clindamycin since she has an allergy to doxcycline  - advised she have a doctor look at it on Wednesday during her dialysis session  - if there is no improvement then advised to follow up on Friday. May need imaging to rule out deeper infection or admission for IV antibiotics.  - discussed with Dr. Andria Frames.

## 2015-11-13 DIAGNOSIS — L0390 Cellulitis, unspecified: Secondary | ICD-10-CM | POA: Insufficient documentation

## 2015-11-13 NOTE — Assessment & Plan Note (Signed)
Her right foot is swollen so want to keep in mind abscess formation  - change keflex to clindamycin since she has an allergy to doxcycline  - advised she have a doctor look at it on Wednesday during her dialysis session  - if there is no improvement then advised to follow up on Friday. May need imaging to rule out deeper infection or admission for IV antibiotics.  - discussed with Dr. Andria Frames.

## 2015-11-15 ENCOUNTER — Ambulatory Visit (INDEPENDENT_AMBULATORY_CARE_PROVIDER_SITE_OTHER): Payer: Medicare Other | Admitting: Family Medicine

## 2015-11-15 ENCOUNTER — Telehealth: Payer: Self-pay

## 2015-11-15 VITALS — BP 206/106 | HR 95 | Temp 98.5°F

## 2015-11-15 DIAGNOSIS — L03115 Cellulitis of right lower limb: Secondary | ICD-10-CM

## 2015-11-15 MED ORDER — CLINDAMYCIN HCL 300 MG PO CAPS: 300.0000 mg | ORAL_CAPSULE | Freq: Three times a day (TID) | ORAL | Status: DC

## 2015-11-15 MED ORDER — HYDROCODONE-ACETAMINOPHEN 5-325 MG PO TABS: 1.0000 | ORAL_TABLET | Freq: Four times a day (QID) | ORAL | Status: DC | PRN

## 2015-11-15 NOTE — Telephone Encounter (Signed)
Pt came and picked up the Rx and AVS. Ottis Stain, [place]

## 2015-11-15 NOTE — Progress Notes (Signed)
Subjective: Cindy Luna is a 35 y.o. 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   This was originally diagnosed in a [place] hospital on 4/16 where venous duplex ruled out DVT. No wound or bite has been noted as cause. She was seen by PCP on 4/18 where insufficient improvement in symptoms on keflex necessitated switch to clindamycin. She has been taking this without N/V/D and receiving ancef (starting 4/19) and fortaz (starting today/4/21) at HD. The redness has improved proximally but not resolved, still involving the right foot and ankle with swelling and severe constant pain. Pain is worse with walking, which is better with crutches. Nothing taken for the pain. Further denies fevers, chills, feeling ill, inability to move ankle or toes.   - ROS: As above - Non-smoker - Allergic to doxycycline, azithromycin, and red man syndrome with vancomycin. No analgesic allergies.   Objective: BP 206/106 mmHg  Pulse 95  Temp(Src) 98.5 F (36.9 C) (Oral)  Wt   LMP 10/25/2015 Gen: Well-appearing obese, pleasant 35 y.o. female in no distress CV: Regular rate, ?venous hum poorly localized, but no murmur; radial, DP and PT pulses 2+ bilaterally; no JVD, cap refill < 2 sec. RLE: Tender edema and swelling involving the dorsum of right foot extending over ankle circumferentially and approximately 10cm proximally with indistinct margins. No palpable fluctuance. DP and PT pulses palpable. cap refill < 2 sec. Able to flex/extend ankle and all toes against resistance.   Assessment/Plan: [name] is a 35 y.o. female here for cellulitis.   Cellulitis No systemic S/Sxs of illness strongly suggestive against osteo or septic ankle. No signs of abscess.  - Continue clindamycin and IV abx (ancef and fortaz) with HD for at least 7 more days. - Follow up within 1 week. - Elevate extremity  - Will provide hydrocodone short course for pain.  -  Urged to present to ED for imaging and blood work if condition worsens.

## 2015-11-15 NOTE — Telephone Encounter (Signed)
LVM for pt to call the office. She left her Rx and AVS. Please inform pt they will be up front for pick up. Ottis Stain, CMA

## 2015-11-15 NOTE — Assessment & Plan Note (Signed)
No systemic S/Sxs of illness strongly suggestive against osteo or septic ankle. No signs of abscess.  - Continue clindamycin and IV abx (ancef and fortaz) with HD for at least 7 more days. - Follow up within 1 week. - Elevate extremity  - Will provide hydrocodone short course for pain.  - Urged to present to ED for imaging and blood work if condition worsens.

## 2015-11-15 NOTE — Patient Instructions (Addendum)
Continue clindamycin for at least 7 more days, or until you follow up next week.  Take hydrocodone only as needed for pain Keep the leg elevated If the redness gets worse, or you develop fever, chills, or feel ill please present to the ED for evluation as this may indicate an infection of the bone or joint.

## 2015-11-27 ENCOUNTER — Ambulatory Visit (INDEPENDENT_AMBULATORY_CARE_PROVIDER_SITE_OTHER): Payer: Medicare Other | Admitting: Family Medicine

## 2015-11-27 ENCOUNTER — Encounter: Payer: Self-pay | Admitting: Family Medicine

## 2015-11-27 VITALS — BP 163/103 | HR 110 | Temp 98.7°F | Ht 70.0 in | Wt 313.2 lb

## 2015-11-27 DIAGNOSIS — N644 Mastodynia: Secondary | ICD-10-CM | POA: Diagnosis present

## 2015-11-27 NOTE — Patient Instructions (Signed)
Ordered an ultrasound to further evaluate your breast pain.  Please let me know if you have not had this scheduled in the next week.  - You can take Tylenol 1000 mg every 8 hours as needed for pain - For pain above and beyond that you can take your Norco one pill every 6 hours as needed  - Call or return to clinic sooner if you develop any fevers, redness or warmth of the breast, or new or concerning symptoms

## 2015-11-27 NOTE — Assessment & Plan Note (Signed)
Pain in right lower breast 1 day without palpable mass.  Obesity and pendulous breasts.  - Ordered ultrasound of right breast to assess for cystic lesions or masses - Recommended Tylenol 1000 mg 3 times a day when necessary - Check TSH, CBC - PCP in one week if pain unresolved

## 2015-11-27 NOTE — Progress Notes (Signed)
  Patient name: Cindy Luna MRN [id number]  Date of birth: Feb 09, 1981  CC & HPI:  Cindy Luna is a 35 y.o. female presenting today for breast pain.  She reports onset of pain in her right breast this morning.  She denies any masses, nipple discharge, fever, unexpected weight loss.  She is concerned because she just completed a course of clindamycin for cellulitis in her leg.  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  Reports family history of breast cancer in maternal aunt, unsure about or menopausal status.  Reports started her period 3 days ago, and current since having menses.   Smoking History Noted  Objective Findings:  Vitals: BP 163/103 mmHg  Pulse 110  Temp(Src) 98.7 F (37.1 C) (Oral)  Ht 5\' 10"  (1.778 m)  Wt 313 lb 3.2 oz (142.067 kg)  BMI 44.94 kg/m2  SpO2 98%  LMP 11/24/2015  Gen: NAD: morbidly obese HEENT: Thyroid nonpalpable Right Breast: No erythema or warmth; diffuse tenderness and lower outer and inner quadrant with generalized swelling and right outer quadrant without palpable nodule or fluctuant mass; no nipple discharge; no axillary or cervical adenopathy CV: RRR w/o m/r/g, pulses +2 b/l Resp: CTAB w/ normal respiratory effort  Assessment & Plan:   Pain of right breast Pain in right lower breast 1 day without palpable mass.  Obesity and pendulous breasts.  - Ordered ultrasound of right breast to assess for cystic lesions or masses - Recommended Tylenol 1000 mg 3 times a day when necessary - Check TSH, CBC - PCP in one week if pain unresolved

## 2015-12-02 ENCOUNTER — Telehealth: Payer: Self-pay | Admitting: *Deleted

## 2015-12-02 ENCOUNTER — Other Ambulatory Visit: Payer: Self-pay

## 2015-12-02 NOTE — Telephone Encounter (Signed)
Pt was seen in the office a couple weeks ago for cellulitis and the spot now has a circle of dead skin. Wants to know what she should do. Please advise. Deseree Kennon Holter, CMA

## 2015-12-04 ENCOUNTER — Telehealth: Payer: Self-pay | Admitting: Family Medicine

## 2015-12-04 NOTE — Telephone Encounter (Signed)
Need order faxed to [facility] on [facility] @ [phone] regarding patient getting lab for A!C at the center.

## 2015-12-05 NOTE — Telephone Encounter (Signed)
Covering inbox for Dr Raeford Razor.  Last seen by PCP Dr Raeford Razor 11/12/15 for Right lower extremity cellulitis, and then seen in follow-up by Dr Bonner Puna on 11/15/15 for same problem. She was advised to follow-up within 1 week, but did not. She has since been seen in early 11/2015 for different problem.  I am not sure how to advise this patient regarding her complaint of dead skin.  Could you please call patient back to clarify that she does not have any persistent redness, swelling, pain, or drainage at site of cellulitis? If she does, she should follow-up for re-evaluation as advised. Otherwise if just area of dead skin that is dried and flaking off, this can be normal healing process if she had swelling before in that area.  If other questions regarding cellulitis, could you please route message to Dr Bonner Puna, last provider to see patient?  Nobie Putnam, [place], PGY-3

## 2015-12-06 LAB — BASIC METABOLIC PANEL
BUN: 13 mg/dL (ref 4–21)
Creatinine: 11 mg/dL — AB (ref 0.5–1.1)
Glucose: 97 mg/dL
POTASSIUM: 4.2 mmol/L (ref 3.4–5.3)
Sodium: 141 mmol/L (ref 137–147)

## 2015-12-06 LAB — CBC AND DIFFERENTIAL
HCT: 31 % — AB (ref 36–46)
HEMOGLOBIN: 9.8 g/dL — AB (ref 12.0–16.0)
PLATELETS: 203 10*3/uL (ref 150–399)
WBC: 5.7 10*3/mL

## 2015-12-06 LAB — HEMOGLOBIN A1C: Hemoglobin A1C: 5.1

## 2015-12-06 LAB — TSH: TSH: 2.57 u[IU]/mL (ref 0.41–5.90)

## 2015-12-09 ENCOUNTER — Ambulatory Visit
Admission: RE | Admit: 2015-12-09 | Discharge: 2015-12-09 | Disposition: A | Discharge status: Home / Self Care | Payer: Medicare Other | Source: Ambulatory Visit | Attending: Family Medicine | Admitting: Family Medicine

## 2015-12-09 DIAGNOSIS — N644 Mastodynia: Secondary | ICD-10-CM

## 2015-12-09 NOTE — Telephone Encounter (Signed)
Contacted pt and she stated that she had let her nephrologist look at the spot and no longer needed the below information. Katharina Caper, Ednah Hammock D, [place]

## 2015-12-11 ENCOUNTER — Encounter: Payer: Self-pay | Admitting: Family Medicine

## 2016-05-08 ENCOUNTER — Encounter: Payer: Self-pay | Admitting: Gastroenterology

## 2016-05-08 ENCOUNTER — Ambulatory Visit (INDEPENDENT_AMBULATORY_CARE_PROVIDER_SITE_OTHER): Payer: Medicare Other | Admitting: Gastroenterology

## 2016-05-08 VITALS — HR 96 | Ht 68.0 in | Wt 313.1 lb

## 2016-05-08 DIAGNOSIS — Z992 Dependence on renal dialysis: Secondary | ICD-10-CM

## 2016-05-08 DIAGNOSIS — K649 Unspecified hemorrhoids: Secondary | ICD-10-CM

## 2016-05-08 DIAGNOSIS — K625 Hemorrhage of anus and rectum: Secondary | ICD-10-CM

## 2016-05-08 DIAGNOSIS — N186 End stage renal disease: Secondary | ICD-10-CM

## 2016-05-08 NOTE — Patient Instructions (Signed)
Hemorrhoids Hemorrhoids are swollen veins around the rectum or anus. There are two types of hemorrhoids:   Internal hemorrhoids. These occur in the veins just inside the rectum. They may poke through to the outside and become irritated and painful.  External hemorrhoids. These occur in the veins outside the anus and can be felt as a painful swelling or hard lump near the anus. CAUSES  Pregnancy.   Obesity.   Constipation or diarrhea.   Straining to have a bowel movement.   Sitting for long periods on the toilet.  Heavy lifting or other activity that caused you to strain.  Anal intercourse. SYMPTOMS   Pain.   Anal itching or irritation.   Rectal bleeding.   Fecal leakage.   Anal swelling.   One or more lumps around the anus.  DIAGNOSIS  Your caregiver may be able to diagnose hemorrhoids by visual examination. Other examinations or tests that may be performed include:   Examination of the rectal area with a gloved hand (digital rectal exam).   Examination of anal canal using a small tube (scope).   A blood test if you have lost a significant amount of blood.  A test to look inside the colon (sigmoidoscopy or colonoscopy). TREATMENT Most hemorrhoids can be treated at home. However, if symptoms do not seem to be getting better or if you have a lot of rectal bleeding, your caregiver may perform a procedure to help make the hemorrhoids get smaller or remove them completely. Possible treatments include:   Placing a rubber band at the base of the hemorrhoid to cut off the circulation (rubber band ligation).   Injecting a chemical to shrink the hemorrhoid (sclerotherapy).   Using a tool to burn the hemorrhoid (infrared light therapy).   Surgically removing the hemorrhoid (hemorrhoidectomy).   Stapling the hemorrhoid to block blood flow to the tissue (hemorrhoid stapling).  HOME CARE INSTRUCTIONS   Eat foods with fiber, such as whole grains, beans,  nuts, fruits, and vegetables. Ask your doctor about taking products with added fiber in them (fibersupplements).  Increase fluid intake. Drink enough water and fluids to keep your urine clear or pale yellow.   Exercise regularly.   Go to the bathroom when you have the urge to have a bowel movement. Do not wait.   Avoid straining to have bowel movements.   Keep the anal area dry and clean. Use wet toilet paper or moist towelettes after a bowel movement.   Medicated creams and suppositories may be used or applied as directed.   Only take over-the-counter or prescription medicines as directed by your caregiver.   Take warm sitz baths for 15-20 minutes, 3-4 times a day to ease pain and discomfort.   Place ice packs on the hemorrhoids if they are tender and swollen. Using ice packs between sitz baths may be helpful.   Put ice in a plastic bag.   Place a towel between your skin and the bag.   Leave the ice on for 15-20 minutes, 3-4 times a day.   Do not use a donut-shaped pillow or sit on the toilet for long periods. This increases blood pooling and pain.  SEEK MEDICAL CARE IF:  You have increasing pain and swelling that is not controlled by treatment or medicine.  You have uncontrolled bleeding.  You have difficulty or you are unable to have a bowel movement.  You have pain or inflammation outside the area of the hemorrhoids. MAKE SURE YOU:  Understand these instructions.    Will watch your condition.  Will get help right away if you are not doing well or get worse.   This information is not intended to replace advice given to you by your health care provider. Make sure you discuss any questions you have with your health care provider.   Document Released: 07/10/2000 Document Revised: 06/29/2012 Document Reviewed: 05/17/2012 Elsevier Interactive Patient Education [employer].  If you are age 17 or older, your body mass index should be between 23-30. Your  Body mass index is 47.61 kg/m. If this is out of the aforementioned range listed, please consider follow up with your Primary Care Provider.  If you are age 56 or younger, your body mass index should be between 19-25. Your Body mass index is 47.61 kg/m. If this is out of the aformentioned range listed, please consider follow up with your Primary Care Provider.   Thank you for choosing [facility]  Dr Wilfrid Lund III

## 2016-05-08 NOTE — Progress Notes (Signed)
[facility] Consult Note:  History: Cindy Luna 05/08/2016  Referring physician: Carlyle Dolly, MD  Reason for consult/chief complaint: Rectal Bleeding (BRB on the toilet paper x 2-3 days) and Diarrhea (intermittent)   Subjective  HPI:  This is a 35 year old woman referred by nephrology for recent painless anal/rectal bleeding. She really describes blood just on the paper but not frank blood in the toilet bowl for about 2 or 3 days sometime last week. Her stools were a little loose at that time as well but have now normalized. She denies abdominal pain, nausea, vomiting, early satiety, dysphagia or weight loss. She believes she had an upper endoscopy and colonoscopy in [place], perhaps in 2009, but cannot recall why it was done.  ROS:  Review of Systems  Constitutional: Negative for appetite change and unexpected weight change.  HENT: Negative for mouth sores and voice change.   Eyes: Negative for pain and redness.  Respiratory: Negative for cough and shortness of breath.   Cardiovascular: Negative for chest pain and palpitations.  Genitourinary: Negative for dysuria and hematuria.  Musculoskeletal: Negative for arthralgias and myalgias.  Skin: Negative for pallor and rash.  Neurological: Negative for weakness and headaches.  Hematological: Negative for adenopathy.     Past Medical History: Past Medical History:  Diagnosis Date  . Anemia   . Depression   . DM (diabetes mellitus) ([place])   . End stage renal disease ([place])   . HLD (hyperlipidemia)   . HTN (hypertension)   . Lupus   . Renal disorder   . Sleep apnea      Past Surgical History: Past Surgical History:  Procedure Laterality Date  . AV FISTULA PLACEMENT  2016 right, 2009 left   left and right are both working   . CESAREAN SECTION    . OTHER SURGICAL HISTORY Left    AV fistula stents, arm  . TUBAL LIGATION  2010     Family History: Family History  Problem Relation Age of Onset   . Hypertension Mother   . Diabetes Mother   . Thyroid disease Sister   . Colon cancer Maternal Grandmother   . Prostate cancer Maternal Grandfather   . Breast cancer Maternal Aunt   . Diabetes Other     both sides of the fam  . Hypertension Other     both sides of the fam    Social History: Social History   Social History  . Marital status: Single    Spouse name: N/A  . Number of children: 1  . Years of education: N/A   Social History Main Topics  . Smoking status: Never Smoker  . Smokeless tobacco: Never Used  . Alcohol use No  . Drug use: No  . Sexual activity: Yes    Birth control/ protection: None   Other Topics Concern  . None   Social History Narrative   ** Merged History Encounter **       Level of education: college    Employment: unemployed    Transportation: car    Exercise: no   Housing situation: apt   Relationships (safe): yes   Contact for message (voicemail): [phone]       Allergies: Allergies  Allergen Reactions  . Azithromycin Hives  . Benadryl [Diphenhydramine Hcl] Hives  . Doxycycline Hyclate   . Vancomycin Swelling  . Adhesive [Tape] Rash    Outpatient Meds: Current Outpatient Prescriptions  Medication Sig Dispense Refill  . cetirizine (ZYRTEC) 10 MG tablet Take 10  mg by mouth daily.    . cetirizine (ZYRTEC) 10 MG tablet Take 10 mg by mouth daily.    . cinacalcet (SENSIPAR) 60 MG tablet Take 60 mg by mouth daily with supper.     . pantoprazole (PROTONIX) 40 MG tablet Take 40 mg by mouth daily.  6  . sertraline (ZOLOFT) 50 MG tablet Take 50 mg by mouth at bedtime.  10  . sevelamer (RENVELA) 800 MG tablet Take 800 mg by mouth as directed. 3 tablets with snacks and 5 tablets with meals     No current facility-administered medications for this visit.       ___________________________________________________________________ Objective   Exam:  Pulse 96   Ht 5\' 8"  (1.727 m) Comment: height measured without shoes  Wt (!)  313 lb 2 oz (142 kg)   LMP 02/06/2016   BMI 47.61 kg/m  Exam chaperoned by our MA Toni  General: this is a(n) Morbidly obese young woman   Eyes: sclera anicteric, no redness  ENT: oral mucosa moist without lesions, no cervical or supraclavicular lymphadenopathy, good dentition  CV: RRR without murmur, S1/S2, no JVD, no peripheral edema  Resp: clear to auscultation bilaterally, normal RR and effort noted  GI: soft, no tenderness, with active bowel sounds. No guarding or palpable organomegaly noted.  Skin; warm and dry, no rash or jaundice noted  Neuro: awake, alert and oriented x 3. Normal gross motor function and fluent speech Rectal: Normal sphincter tone, no fissure, nontender, no palpable internal lesions. Anoscopy reveals 3 columns of internal hemorrhoids.  Assessment: Encounter Diagnoses  Name Primary?  . Rectal bleed Yes  . Bleeding hemorrhoids   . ESRD on dialysis [facility])     Minor hemorrhoidal bleeding, now resolved.  Plan:  As needed use of Preparation H suppository Follow-up as needed. If bleeding should become more frequent, colonoscopy would be indicated to rule out other sources before considering hemorrhoidal banding.  Thank you for the courtesy of this consult.  Please call me with any questions or concerns.  Nelida Meuse III  CC: Carlyle Dolly, MD

## 2016-06-24 ENCOUNTER — Encounter: Payer: Self-pay | Admitting: Obstetrics and Gynecology

## 2016-06-24 ENCOUNTER — Ambulatory Visit (INDEPENDENT_AMBULATORY_CARE_PROVIDER_SITE_OTHER): Payer: Medicare Other | Admitting: Obstetrics and Gynecology

## 2016-06-24 DIAGNOSIS — N938 Other specified abnormal uterine and vaginal bleeding: Secondary | ICD-10-CM | POA: Diagnosis not present

## 2016-06-24 HISTORY — DX: Other specified abnormal uterine and vaginal bleeding: N93.8

## 2016-06-24 NOTE — Progress Notes (Addendum)
Patient is in the office for irregular menstrual cycles. Pt states that some months it barely comes and now it is more frequent and heavy.   History: Ms Voight is a 35 yo G1P0101 who presents for eval of heavy irregular cycles. Pt reports having a BTL and Novasure back in 2010. She had not cycles until December 2013. Cycles returned then and were for the most part monthly and scant until the last few months. Over the last few months her cycles have become more freq, (2-3 times a month)  Heavier (chnages pads 3-4 times a day) and more crampy.  She has mutiple chronic medical problems related to her Lupus. These include Dm, HTN and ESRD (diaylsis M/W/F), morbid obesity and OSA.   Last pap was 1 yr ago and was normal  OB C section at 32 weeks  PE AF VSS  Obese female in NAD Lungs clear Heart RRR Abd soft + BS obese GU nl EGBUS uterus small no adnexal masses limited by pt habitus  A/P DUB  Reviewed with pt. Treatment options are limited due to chronic medical problems and s/p Novasure. Will check GYN U/S. F/U after U/S to discuss results and treatment

## 2016-07-07 ENCOUNTER — Other Ambulatory Visit: Payer: Self-pay

## 2016-07-28 ENCOUNTER — Ambulatory Visit: Payer: Self-pay | Admitting: Obstetrics and Gynecology

## 2016-08-11 ENCOUNTER — Other Ambulatory Visit: Payer: Self-pay

## 2016-08-18 ENCOUNTER — Ambulatory Visit: Payer: Self-pay | Admitting: Obstetrics and Gynecology

## 2016-08-25 ENCOUNTER — Ambulatory Visit (HOSPITAL_COMMUNITY): Payer: Medicare Other

## 2016-08-25 ENCOUNTER — Other Ambulatory Visit: Payer: Self-pay

## 2016-08-27 ENCOUNTER — Ambulatory Visit (HOSPITAL_COMMUNITY)
Admission: RE | Admit: 2016-08-27 | Discharge: 2016-08-27 | Disposition: A | Discharge status: Home / Self Care | Payer: Medicare Other | Source: Ambulatory Visit | Attending: Obstetrics and Gynecology | Admitting: Obstetrics and Gynecology

## 2016-08-27 DIAGNOSIS — N926 Irregular menstruation, unspecified: Secondary | ICD-10-CM | POA: Diagnosis present

## 2016-08-27 DIAGNOSIS — N938 Other specified abnormal uterine and vaginal bleeding: Secondary | ICD-10-CM

## 2016-08-28 ENCOUNTER — Other Ambulatory Visit: Payer: Self-pay

## 2016-08-28 DIAGNOSIS — N926 Irregular menstruation, unspecified: Secondary | ICD-10-CM | POA: Insufficient documentation

## 2016-09-01 ENCOUNTER — Ambulatory Visit (INDEPENDENT_AMBULATORY_CARE_PROVIDER_SITE_OTHER): Payer: Medicare Other | Admitting: Obstetrics and Gynecology

## 2016-09-01 ENCOUNTER — Encounter: Payer: Self-pay | Admitting: Obstetrics and Gynecology

## 2016-09-01 VITALS — BP 168/91 | HR 84 | Wt 316.3 lb

## 2016-09-01 DIAGNOSIS — N938 Other specified abnormal uterine and vaginal bleeding: Secondary | ICD-10-CM

## 2016-09-01 DIAGNOSIS — N83209 Unspecified ovarian cyst, unspecified side: Secondary | ICD-10-CM | POA: Insufficient documentation

## 2016-09-01 DIAGNOSIS — N83202 Unspecified ovarian cyst, left side: Secondary | ICD-10-CM | POA: Diagnosis not present

## 2016-09-01 HISTORY — DX: Unspecified ovarian cyst, unspecified side: N83.209

## 2016-09-01 NOTE — Patient Instructions (Signed)
Ovarian Cyst  An ovarian cyst is a fluid-filled sac that forms on an ovary. The ovaries are small organs that produce eggs in women. Various types of cysts can form on the ovaries. Some may cause symptoms and require treatment. Most ovarian cysts go away on their own, are not cancerous (are benign), and do not cause problems. Common types of ovarian cysts include:  Functional (follicle) cysts.  Occur during the menstrual cycle, and usually go away with the next menstrual cycle if you do not get pregnant.  Usually cause no symptoms.  Endometriomas.  Are cysts that form from the tissue that lines the uterus (endometrium).  Are sometimes called "chocolate cysts" because they become filled with blood that turns brown.  Can cause pain in the lower abdomen during intercourse and during your period.  Cystadenoma cysts.  Develop from cells on the outside surface of the ovary.  Can get very large and cause lower abdomen pain and pain with intercourse.  Can cause severe pain if they twist or break open (rupture).  Dermoid cysts.  Are sometimes found in both ovaries.  May contain different kinds of body tissue, such as skin, teeth, hair, or cartilage.  Usually do not cause symptoms unless they get very big.  Theca lutein cysts.  Occur when too much of a certain hormone (human chorionic gonadotropin) is produced and overstimulates the ovaries to produce an egg.  Are most common after having procedures used to assist with the conception of a baby (in vitro fertilization). What are the causes? Ovarian cysts may be caused by:  Ovarian hyperstimulation syndrome. This is a condition that can develop from taking fertility medicines. It causes multiple large ovarian cysts to form.  Polycystic ovarian syndrome (PCOS). This is a common hormonal disorder that can cause ovarian cysts, as well as problems with your period or fertility. What increases the risk? The following factors may make you  more likely to develop ovarian cysts:  Being overweight or obese.  Taking fertility medicines.  Taking certain forms of hormonal birth control.  Smoking. What are the signs or symptoms? Many ovarian cysts do not cause symptoms. If symptoms are present, they may include:  Pelvic pain or pressure.  Pain in the lower abdomen.  Pain during sex.  Abdominal swelling.  Abnormal menstrual periods.  Increasing pain with menstrual periods. How is this diagnosed? These cysts are commonly found during a routine pelvic exam. You may have tests to find out more about the cyst, such as:  Ultrasound.  X-ray of the pelvis.  CT scan.  MRI.  Blood tests. How is this treated? Many ovarian cysts go away on their own without treatment. Your health care provider may want to check your cyst regularly for 2-3 months to see if it changes. If you are in menopause, it is especially important to have your cyst monitored closely because menopausal women have a higher rate of ovarian cancer. When treatment is needed, it may include:  Medicines to help relieve pain.  A procedure to drain the cyst (aspiration).  Surgery to remove the whole cyst.  Hormone treatment or birth control pills. These methods are sometimes used to help dissolve a cyst. Follow these instructions at home:  Take over-the-counter and prescription medicines only as told by your health care provider.  Do not drive or use heavy machinery while taking prescription pain medicine.  Get regular pelvic exams and Pap tests as often as told by your health care provider.  Return to your   normal activities as told by your health care provider. Ask your health care provider what activities are safe for you.  Do not use any products that contain nicotine or tobacco, such as cigarettes and e-cigarettes. If you need help quitting, ask your health care provider.  Keep all follow-up visits as told by your health care provider. This is  important. Contact a health care provider if:  Your periods are late, irregular, or painful, or they stop.  You have pelvic pain that does not go away.  You have pressure on your bladder or trouble emptying your bladder completely.  You have pain during sex.  You have any of the following in your abdomen:  A feeling of fullness.  Pressure.  Discomfort.  Pain that does not go away.  Swelling.  You feel generally ill.  You become constipated.  You lose your appetite.  You develop severe acne.  You start to have more body hair and facial hair.  You are gaining weight or losing weight without changing your exercise and eating habits.  You think you may be pregnant. Get help right away if:  You have abdominal pain that is severe or gets worse.  You cannot eat or drink without vomiting.  You suddenly develop a fever.  Your menstrual period is much heavier than usual. This information is not intended to replace advice given to you by your health care provider. Make sure you discuss any questions you have with your health care provider. Document Released: 07/13/2005 Document Revised: 01/31/2016 Document Reviewed: 12/15/2015 Elsevier Interactive Patient Education  2017 Elsevier Inc.  

## 2016-09-01 NOTE — Progress Notes (Signed)
Pt here for follow of GYN U/S.  U/S obtained for DUB U/S revealed no uterine fibroids, fluid in endometrial cavity, left ovary cyst Findings discussed with pt. This months cycle not as painful.  A/P  DUB              Left ovarian cyst             ESRD  Tx options reviewed with pt. Pt desires to follow Sx for now. Will repeat U/S to follow up ovarian cyst.

## 2016-09-01 NOTE — Progress Notes (Signed)
Patient is in the office for u/s follow up.

## 2016-09-01 NOTE — Addendum Note (Signed)
Addended by: Tristan Schroeder D on: 09/01/2016 11:21 AM   Modules accepted: Orders

## 2016-10-13 ENCOUNTER — Ambulatory Visit (HOSPITAL_COMMUNITY)
Admission: RE | Admit: 2016-10-13 | Discharge: 2016-10-13 | Disposition: A | Discharge status: Home / Self Care | Payer: Medicare Other | Source: Ambulatory Visit | Attending: Obstetrics and Gynecology | Admitting: Obstetrics and Gynecology

## 2016-10-13 DIAGNOSIS — N83202 Unspecified ovarian cyst, left side: Secondary | ICD-10-CM | POA: Diagnosis present

## 2017-08-09 ENCOUNTER — Ambulatory Visit (INDEPENDENT_AMBULATORY_CARE_PROVIDER_SITE_OTHER): Payer: Medicare Other | Admitting: Student

## 2017-08-09 ENCOUNTER — Encounter: Payer: Self-pay | Admitting: Student

## 2017-08-09 VITALS — BP 157/60 | HR 96 | Temp 98.3°F | Wt 321.0 lb

## 2017-08-09 DIAGNOSIS — R030 Elevated blood-pressure reading, without diagnosis of hypertension: Secondary | ICD-10-CM | POA: Diagnosis not present

## 2017-08-09 DIAGNOSIS — H6592 Unspecified nonsuppurative otitis media, left ear: Secondary | ICD-10-CM | POA: Diagnosis not present

## 2017-08-09 DIAGNOSIS — J029 Acute pharyngitis, unspecified: Secondary | ICD-10-CM

## 2017-08-09 MED ORDER — AMOXICILLIN-POT CLAVULANATE 875-125 MG PO TABS: ORAL_TABLET | ORAL | 0 refills | Status: DC

## 2017-08-09 NOTE — Progress Notes (Signed)
Subjective:    Cindy Luna is a 37 y.o. old female here SDA for sore throat and ear pain  HPI Sore throat: this has been going for two days. Denies runny nose, congestion or fever. She reports cough with yellowish green phlegm. No hemoptysis. Denies shortness of breath or chest pain. Denies n/v/d.   Left ear pain: for two days. Pain is constant. Pain her throat and ear with swallowing. Denies drainage or hearing issue. Uses q-tips and ear phones/plugs.   Two little cousins sick. One with emesis and the other with ear infection.  Had similar symptoms about three weeks ago. She was treated with antibiotics for a week. Symptoms resolved with that.   PMH/Problem List: has ESRD on dialysis [facility]); Lupus; Rash and nonspecific skin eruption; Murmur, heart; Pain of right breast; DUB (dysfunctional uterine bleeding); Irregular menses; and Ovarian cyst on their problem list.   has a past medical history of Anemia, Depression, DM (diabetes mellitus) ([place]), End stage renal disease ([place]), HLD (hyperlipidemia), HTN (hypertension), Lupus, Renal disorder, and Sleep apnea.  FH:  Family History  Problem Relation Age of Onset  . Hypertension Mother   . Diabetes Mother   . Thyroid disease Sister   . Colon cancer Maternal Grandmother   . Prostate cancer Maternal Grandfather   . Breast cancer Maternal Aunt   . Diabetes Other        both sides of the fam  . Hypertension Other        both sides of the fam    SH Social History   Tobacco Use  . Smoking status: Never Smoker  . Smokeless tobacco: Never Used  Substance Use Topics  . Alcohol use: No  . Drug use: No    Review of Systems Review of systems negative except for pertinent positives and negatives in history of present illness above.     Objective:     Vitals:   08/09/17 1422  BP: (!) 157/60  Pulse: 96  Temp: 98.3 F (36.8 C)  SpO2: 99%  Weight: (!) 321 lb (145.6 kg)   Body mass index is 48.81 kg/m.  Physical Exam  GEN: appears  well, no apparent distress. Head: normocephalic and atraumatic  Eyes: conjunctiva without injection, sclera anicteric Ears: No preauricular skin lesion or swelling, no discharge, no tenderness with pressure on tragus or gentle tugging on pinna, ear canal with normal landmarks, some erythema of left TM compared to right, no notable fluid effusion, hears finger rubs bilaterally.  Oropharynx: mmm without tonsillar erythema or exudation, uvula midline.  Mild trismus.  Full range of motion in her neck.  HEM: negative for cervical or periauricular lymphadenopathies but some tenderness and swelling of left neck.  CVS: RRR, nl s1 & s2, no murmurs, no edema RESP: no IWOB, good air movement bilaterally, CTAB GI: BS present & normal, soft, NTND SKIN: no apparent skin lesion NEURO: alert and oiented appropriately, no gross deficits  PSYCH: euthymic mood with congruent affect    Assessment and Plan:  1. Sore throat: Not consistent with strep pharyngitis.  One (left neck tenderness) out of 5 on [employer]'s Criteria. However, patient with left neck tenderness and erythema of left TM.  She also have mild trismus but full range of motion in her neck and midline uvula which makes peritonsillar abscess or retropharyngeal abscess unlikely.  However, this doesn't rule our early infectious process/cellulitis.  So we will treat her with Augmentin daily for 10 days.  She is ESRD patient on hemodialysis.  2. Left non-suppurative otitis media: Mild erythema of left TM on exam.  Augmentin as below.  Recommended against Q-tip use and ear buds.  - amoxicillin-clavulanate (AUGMENTIN) 875-125 MG tablet; Take 1 tablet every 24 hours.  May take additional 1 tablet after each dialysis.  Dispense: 15 tablet; Refill: 0  3.  Elevated blood pressure: She is ESRD patient.  Followed by nephrology. Return if symptoms worsen or fail to improve.  Mercy Riding, MD 08/09/17 Pager: [phone]

## 2017-08-09 NOTE — Patient Instructions (Addendum)
It was great seeing you today! We have addressed the following issues today  Sore throat/ear pain: I am concerned that you could have some bacterial infection.  I sent a prescription for Augmentin to your pharmacy.  Please go to emergency department or seek immediate care if you have worsening pain or other symptoms concerning to you.  I strongly recommend against using Q-tips or ear buds.  If we did any lab work today, and the results require attention, either me or my nurse will get in touch with you. If everything is normal, you will get a letter in mail and a message via . If you don't hear from [place] in two weeks, please give [place] a call. Otherwise, we look forward to seeing you again at your next visit. If you have any questions or concerns before then, please call the clinic at [phone].  Please bring all your medications to every doctors visit  Sign up for My Chart to have easy access to your labs results, and communication with your Primary care physician.    Please check-out at the front desk before leaving the clinic.    Take Care,   Dr. Cyndia Skeeters

## 2017-09-29 ENCOUNTER — Ambulatory Visit (INDEPENDENT_AMBULATORY_CARE_PROVIDER_SITE_OTHER): Payer: Medicare Other | Admitting: Family Medicine

## 2017-09-29 ENCOUNTER — Encounter: Payer: Self-pay | Admitting: Family Medicine

## 2017-09-29 ENCOUNTER — Other Ambulatory Visit: Payer: Self-pay

## 2017-09-29 VITALS — BP 142/80 | HR 97 | Temp 98.6°F | Ht 68.0 in | Wt 317.6 lb

## 2017-09-29 DIAGNOSIS — R591 Generalized enlarged lymph nodes: Secondary | ICD-10-CM | POA: Diagnosis present

## 2017-09-29 NOTE — Progress Notes (Signed)
Subjective:    Patient ID: Cindy Luna , female   DOB: 1980-08-04 , 37 y.o..   MRN: [id number]  HPI  VANICE RAPPA is a 37 year old female with past medical history of ESRD on dialysis and lupus here for  Chief Complaint  Patient presents with  . Check Lymph Nodes    1.  Enlarged lymph nodes: started feeling bumps in her neck in December when she was sick, the bumps subsided but then returned in January when she had an ear infection, then subsided. Then the bumps returned in February and have not gone away. The bumps on her neck are not painful. She feels like they are around the same size since they started. Nothing like this has happened before. Her nephrologist felt like she eneded some imaging of her lymph nodes. Night sweats over the last month.  Denies any thyroid abnormalities.  Notes that her neck is usually large.  Review of Systems: Per HPI.   Past Medical History: Patient Active Problem List   Diagnosis Date Noted  . Lymphadenopathy of head and neck 09/29/2017  . Ovarian cyst 09/01/2016  . Irregular menses 08/28/2016  . DUB (dysfunctional uterine bleeding) 06/24/2016  . Pain of right breast 11/27/2015  . Rash and nonspecific skin eruption 05/09/2015  . Murmur, heart 05/09/2015  . ESRD on dialysis ([place]) 04/30/2015  . Lupus 04/30/2015    Medications: reviewed and updated Current Outpatient Medications  Medication Sig Dispense Refill  . cetirizine (ZYRTEC) 10 MG tablet Take 10 mg by mouth daily.    . cinacalcet (SENSIPAR) 60 MG tablet Take 60 mg by mouth daily with supper.     . pantoprazole (PROTONIX) 40 MG tablet Take 40 mg by mouth daily.  6  . sertraline (ZOLOFT) 50 MG tablet Take 25 mg by mouth at bedtime.   10  . sevelamer (RENVELA) 800 MG tablet Take 800 mg by mouth as directed. 3 tablets with snacks and 5 tablets with meals     No current facility-administered medications for this visit.     Social Hx:  reports that  has never smoked. she has never  used smokeless tobacco.   Objective:   BP (!) 142/80   Pulse 97   Temp 98.6 F (37 C) (Oral)   Ht 5\' 8"  (1.727 m)   Wt (!) 317 lb 9.6 oz (144.1 kg)   SpO2 94%   BMI 48.29 kg/m  Physical Exam  Gen: NAD, alert, cooperative with exam, well-appearing HEENT: NCAT, PERRL, clear conjunctiva, oropharynx clear, large neck girth, no lymphadenopathy appreciated Cardiac: Regular rate and rhythm palpable thrill fistula, in left arm Respiratory: Clear to auscultation bilaterally, no wheezes, non-labored breathing Psych: good insight, normal mood and affect  Assessment & Plan:  Lymphadenopathy of head and neck Patient endorsing lymphadenopathy was also found by her nephrologist.  Patient's neck girth is very large and it is very difficult to tell if she has any lymphadenopathy but none was felt on exam today.  Dr. Erin Hearing assess patient's neck as well and he cannot appreciate any lymphadenopathy either.  Additionally difficult to appreciate any thyromegaly.  Is afebrile and overall well-appearing.  Does endorse night sweats over the last months. -Obtain rapid neck and thyroid ultrasound to assess for lymphadenopathy and thyromegaly -Chest x-ray to assess for mediastinal lymphadenopathy -Patient is having CBC drawn next week at her nephrologist, discussed to please share these results with [place] when she obtains these -We will follow-up after imaging  Orders Placed This  Encounter  Procedures  . US Soft Tissue Head/Neck    Standing Status:   Future    Standing Expiration Date:   11/30/2018    Order Specific Question:   Reason for Exam (SYMPTOM  OR DIAGNOSIS REQUIRED)    Answer:   rule out lymphadenopathy and thyromegaly    Order Specific Question:   Preferred imaging location?    Answer:   [facility] [address] [address]  . DG Chest 2 View    Standing Status:   Future    Standing Expiration Date:   11/30/2018    Order Specific Question:   Reason for Exam (SYMPTOM  OR DIAGNOSIS REQUIRED)    Answer:   rule out  mediastinal lymphadenopathy    Order Specific Question:   Is patient pregnant?    Answer:   No    Order Specific Question:   Preferred imaging location?    Answer:   [facility]    Order Specific Question:   Radiology Contrast Protocol - do NOT remove file path    Answer:   [email or web address]    Smitty Cords, MD [place], PGY-3

## 2017-09-29 NOTE — Patient Instructions (Addendum)
Thank you for coming in today, it was so nice to see you! Today we talked about:    Enlarged lymph nodes: we have ordered a thyroid and neck ultrasound.   We have also ordered a chest XRAY   It is ok to wait until you get your labs with your nephrologist. If you can, please ask them to fax these to [place]  If we ordered any tests today, you will be notified via telephone of any abnormalities. If everything is normal you will get a letter in the mail.   If you have any questions or concerns, please do not hesitate to call the office at [phone]. You can also message me directly via MyChart.   Sincerely,  Smitty Cords, MD [place]

## 2017-09-29 NOTE — Assessment & Plan Note (Addendum)
Patient endorsing lymphadenopathy was also found by her nephrologist.  Patient's neck girth is very large and it is very difficult to tell if she has any lymphadenopathy but none was felt on exam today.  Dr. Erin Hearing assess patient's neck as well and he cannot appreciate any lymphadenopathy either.  Additionally difficult to appreciate any thyromegaly.  Is afebrile and overall well-appearing.  Does endorse night sweats over the last months. -Obtain rapid neck and thyroid ultrasound to assess for lymphadenopathy and thyromegaly -Chest x-ray to assess for mediastinal lymphadenopathy -Patient is having CBC drawn next week at her nephrologist, discussed to please share these results with [place] when she obtains these -We will follow-up after imaging

## 2017-10-01 ENCOUNTER — Ambulatory Visit (HOSPITAL_COMMUNITY)
Admission: RE | Admit: 2017-10-01 | Discharge: 2017-10-01 | Disposition: A | Discharge status: Home / Self Care | Payer: Medicare Other | Source: Ambulatory Visit | Attending: Family Medicine | Admitting: Family Medicine

## 2017-10-01 DIAGNOSIS — R591 Generalized enlarged lymph nodes: Secondary | ICD-10-CM | POA: Diagnosis present

## 2017-10-04 ENCOUNTER — Telehealth: Payer: Self-pay | Admitting: Family Medicine

## 2017-10-04 NOTE — Telephone Encounter (Signed)
Called patient to discuss normal CXR and thyroid.neck U/S. She was appreciative of the call.   Smitty Cords, MD [place], PGY-3

## 2017-11-05 ENCOUNTER — Other Ambulatory Visit: Payer: Self-pay | Admitting: Nephrology

## 2017-11-05 DIAGNOSIS — R220 Localized swelling, mass and lump, head: Secondary | ICD-10-CM

## 2017-11-18 ENCOUNTER — Ambulatory Visit
Admission: RE | Admit: 2017-11-18 | Discharge: 2017-11-18 | Disposition: A | Discharge status: Home / Self Care | Payer: Medicare Other | Source: Ambulatory Visit | Attending: Nephrology | Admitting: Nephrology

## 2017-11-18 DIAGNOSIS — R220 Localized swelling, mass and lump, head: Secondary | ICD-10-CM

## 2017-11-18 MED ORDER — IOPAMIDOL (ISOVUE-300) INJECTION 61%
75.0000 mL | Freq: Once | INTRAVENOUS | Status: AC | PRN
  Administered 2017-11-18: 75 mL via INTRAVENOUS

## 2018-04-18 ENCOUNTER — Other Ambulatory Visit: Payer: Self-pay

## 2018-04-18 ENCOUNTER — Other Ambulatory Visit (HOSPITAL_COMMUNITY)
Admission: RE | Admit: 2018-04-18 | Discharge: 2018-04-18 | Disposition: A | Discharge status: Home / Self Care | Payer: Medicare Other | Source: Ambulatory Visit | Attending: Family Medicine | Admitting: Family Medicine

## 2018-04-18 ENCOUNTER — Encounter: Payer: Self-pay | Admitting: Family Medicine

## 2018-04-18 ENCOUNTER — Ambulatory Visit (INDEPENDENT_AMBULATORY_CARE_PROVIDER_SITE_OTHER): Payer: Medicare Other | Admitting: Family Medicine

## 2018-04-18 VITALS — HR 102 | Temp 98.5°F | Ht 68.0 in | Wt 318.0 lb

## 2018-04-18 DIAGNOSIS — Z0000 Encounter for general adult medical examination without abnormal findings: Secondary | ICD-10-CM | POA: Diagnosis not present

## 2018-04-18 DIAGNOSIS — R739 Hyperglycemia, unspecified: Secondary | ICD-10-CM | POA: Diagnosis not present

## 2018-04-18 DIAGNOSIS — Z202 Contact with and (suspected) exposure to infections with a predominantly sexual mode of transmission: Secondary | ICD-10-CM | POA: Diagnosis not present

## 2018-04-18 DIAGNOSIS — Z124 Encounter for screening for malignant neoplasm of cervix: Secondary | ICD-10-CM | POA: Diagnosis not present

## 2018-04-18 DIAGNOSIS — Z6841 Body Mass Index (BMI) 40.0 and over, adult: Secondary | ICD-10-CM

## 2018-04-18 DIAGNOSIS — F17200 Nicotine dependence, unspecified, uncomplicated: Secondary | ICD-10-CM

## 2018-04-18 DIAGNOSIS — Z114 Encounter for screening for human immunodeficiency virus [HIV]: Secondary | ICD-10-CM

## 2018-04-18 LAB — POCT GLYCOSYLATED HEMOGLOBIN (HGB A1C): Hemoglobin A1C: 5.3 % (ref 4.0–5.6)

## 2018-04-18 NOTE — Patient Instructions (Signed)
It was good to see you today!    try and quit smoking!   We are checking some labs today. If results require attention, either myself or my nurse will get in touch with you. If everything is normal, you will get a letter in the mail or a message in My Chart. Please give [place] a call if you do not hear from [place] after 2 weeks.   Please bring all of your medications with you to each visit.   Sign up for My Chart to have easy access to your labs results, and communication with your primary care physician.  Feel free to call with any questions or concerns at any time, at [phone].   Take care,  Dr. Bufford Lope, [place]

## 2018-04-18 NOTE — Progress Notes (Signed)
    Subjective:  Cindy Luna is a 37 y.o. female who presents to the [facility] today for STD testing and lab work  HPI:   She states that although she gets regular blood work at her dialysis center that she has not had her lipid panel checked in some time.  She would also like STD screening along with her blood work today.  She is sexually active without using any birth control.  She does not have any STD symptoms including vaginal discharge, dysuria, rashes.  Tobacco use She started smoking Black and mild cigars 1 daily over the last 1 year.  She uses this to cope with her stress.  She is not currently interested in quitting or exploring other methods of stress coping at this time.  She says that her biggest source of stress is her financial situation.    ROS: Per HPI, otherwise all systems reviewed and negative   Social Hx: She reports that she has been smoking cigars. She started smoking about 20 months ago. She has never used smokeless tobacco. She reports that she does not drink alcohol or use drugs.   Objective:  Physical Exam: Pulse (!) 102   Temp 98.5 F (36.9 C) (Oral)   Ht 5\' 8"  (1.727 m)   Wt (!) 318 lb (144.2 kg)   LMP 03/27/2018   SpO2 97%   BMI 48.35 kg/m   Gen: NAD, resting comfortably CV: RRR with no murmurs appreciated Pulm: NWOB, CTAB with no crackles, wheezes, or rhonchi GI: Normal bowel sounds present. Soft, Nontender, Nondistended. Pelvic exam: normal external genitalia, vulva, vagina, cervix Skin: warm, dry Neuro: grossly normal, moves all extremities Psych: Normal affect and thought content   Assessment/Plan:  Class 3 severe obesity with serious comorbidity and body mass index (BMI) of 45.0 to 49.9 in adult [facility]) Patient counseled on healthy lifestyle today.  Check lipid panel and A1c.  Tobacco use disorder Counseled on smoking cessation.  Patient plans to cut down but is not yet ready to quit.  She does not believe that exploring other methods of  dealing with stress would be helpful for her at this time.  Potential exposure to STD Patient is asymptomatic but requested STD testing today.  HIV and RPR drawn today.  Will obtain GC, chlamydia, check off of Pap smear  Healthcare maintenance Pap smear collected today   Bufford Lope, DO PGY-3, [facility] 04/18/2018 4:03 PM

## 2018-04-18 NOTE — Assessment & Plan Note (Signed)
Counseled on smoking cessation.  Patient plans to cut down but is not yet ready to quit.  She does not believe that exploring other methods of dealing with stress would be helpful for her at this time.

## 2018-04-18 NOTE — Assessment & Plan Note (Signed)
Pap smear collected today 

## 2018-04-18 NOTE — Assessment & Plan Note (Signed)
Patient is asymptomatic but requested STD testing today.  HIV and RPR drawn today.  Will obtain GC, chlamydia, check off of Pap smear

## 2018-04-18 NOTE — Assessment & Plan Note (Signed)
Patient counseled on healthy lifestyle today.  Check lipid panel and A1c.

## 2018-04-19 LAB — LIPID PANEL
CHOL/HDL RATIO: 4.4 ratio (ref 0.0–4.4)
Cholesterol, Total: 166 mg/dL (ref 100–199)
HDL: 38 mg/dL — ABNORMAL LOW (ref >39)
LDL CALC: 61 mg/dL (ref 0–99)
Triglycerides: 334 mg/dL — ABNORMAL HIGH (ref 0–149)
VLDL CHOLESTEROL CAL: 67 mg/dL — AB (ref 5–40)

## 2018-04-19 LAB — HIV ANTIBODY (ROUTINE TESTING W REFLEX): HIV SCREEN 4TH GENERATION: NONREACTIVE

## 2018-04-19 LAB — RPR: RPR: NONREACTIVE

## 2018-04-20 LAB — CYTOLOGY - PAP
ADEQUACY: ABSENT
CHLAMYDIA, DNA PROBE: NEGATIVE
Diagnosis: NEGATIVE
HPV: NOT DETECTED
NEISSERIA GONORRHEA: NEGATIVE
TRICH (WINDOWPATH): NEGATIVE

## 2018-04-21 ENCOUNTER — Encounter: Payer: Self-pay | Admitting: Family Medicine

## 2018-04-22 ENCOUNTER — Emergency Department (HOSPITAL_COMMUNITY)
Admission: EM | Admit: 2018-04-22 | Discharge: 2018-04-23 | Disposition: A | Discharge status: Home / Self Care | Payer: Medicare Other | Attending: Emergency Medicine | Admitting: Emergency Medicine

## 2018-04-22 ENCOUNTER — Other Ambulatory Visit: Payer: Self-pay

## 2018-04-22 ENCOUNTER — Encounter (HOSPITAL_COMMUNITY): Payer: Self-pay | Admitting: *Deleted

## 2018-04-22 DIAGNOSIS — R1030 Lower abdominal pain, unspecified: Secondary | ICD-10-CM | POA: Insufficient documentation

## 2018-04-22 DIAGNOSIS — Z79899 Other long term (current) drug therapy: Secondary | ICD-10-CM | POA: Insufficient documentation

## 2018-04-22 DIAGNOSIS — Z992 Dependence on renal dialysis: Secondary | ICD-10-CM | POA: Insufficient documentation

## 2018-04-22 DIAGNOSIS — E119 Type 2 diabetes mellitus without complications: Secondary | ICD-10-CM | POA: Insufficient documentation

## 2018-04-22 DIAGNOSIS — N186 End stage renal disease: Secondary | ICD-10-CM | POA: Insufficient documentation

## 2018-04-22 DIAGNOSIS — I120 Hypertensive chronic kidney disease with stage 5 chronic kidney disease or end stage renal disease: Secondary | ICD-10-CM | POA: Diagnosis not present

## 2018-04-22 DIAGNOSIS — E785 Hyperlipidemia, unspecified: Secondary | ICD-10-CM | POA: Insufficient documentation

## 2018-04-22 DIAGNOSIS — R109 Unspecified abdominal pain: Secondary | ICD-10-CM

## 2018-04-22 DIAGNOSIS — F17290 Nicotine dependence, other tobacco product, uncomplicated: Secondary | ICD-10-CM | POA: Diagnosis not present

## 2018-04-22 LAB — I-STAT BETA HCG BLOOD, ED (MC, WL, AP ONLY)

## 2018-04-22 LAB — CBC
HCT: 36.4 % (ref 36.0–46.0)
Hemoglobin: 11.8 g/dL — ABNORMAL LOW (ref 12.0–15.0)
MCH: 30.9 pg (ref 26.0–34.0)
MCHC: 32.4 g/dL (ref 30.0–36.0)
MCV: 95.3 fL (ref 78.0–100.0)
Platelets: 263 10*3/uL (ref 150–400)
RBC: 3.82 MIL/uL — ABNORMAL LOW (ref 3.87–5.11)
RDW: 14 % (ref 11.5–15.5)
WBC: 8.9 10*3/uL (ref 4.0–10.5)

## 2018-04-22 NOTE — ED Triage Notes (Signed)
The pt is c/o lower abd pain for 2 days with n and v  lmp now  The pt is a dialysis pt dialyzed today  Rt arm fistula

## 2018-04-23 DIAGNOSIS — R1030 Lower abdominal pain, unspecified: Secondary | ICD-10-CM | POA: Diagnosis not present

## 2018-04-23 LAB — COMPREHENSIVE METABOLIC PANEL
ALK PHOS: 70 U/L (ref 38–126)
ALT: 9 U/L (ref 0–44)
AST: 8 U/L — AB (ref 15–41)
Albumin: 3.6 g/dL (ref 3.5–5.0)
Anion gap: 14 (ref 5–15)
BUN: 16 mg/dL (ref 6–20)
CALCIUM: 9.6 mg/dL (ref 8.9–10.3)
CHLORIDE: 92 mmol/L — AB (ref 98–111)
CO2: 29 mmol/L (ref 22–32)
CREATININE: 6.89 mg/dL — AB (ref 0.44–1.00)
GFR calc Af Amer: 8 mL/min — ABNORMAL LOW (ref >60)
GFR, EST NON AFRICAN AMERICAN: 7 mL/min — AB (ref >60)
Glucose, Bld: 103 mg/dL — ABNORMAL HIGH (ref 70–99)
Potassium: 3.5 mmol/L (ref 3.5–5.1)
SODIUM: 135 mmol/L (ref 135–145)
Total Bilirubin: 0.5 mg/dL (ref 0.3–1.2)
Total Protein: 7.8 g/dL (ref 6.5–8.1)

## 2018-04-23 LAB — WET PREP, GENITAL
Sperm: NONE SEEN
TRICH WET PREP: NONE SEEN
Yeast Wet Prep HPF POC: NONE SEEN

## 2018-04-23 LAB — LIPASE, BLOOD: Lipase: 43 U/L (ref 11–51)

## 2018-04-23 MED ORDER — MORPHINE SULFATE (PF) 4 MG/ML IV SOLN
4.0000 mg | Freq: Once | INTRAVENOUS | Status: AC
  Administered 2018-04-23: 4 mg via INTRAMUSCULAR
  Filled 2018-04-23: qty 1

## 2018-04-23 MED ORDER — ONDANSETRON 4 MG PO TBDP
4.0000 mg | ORAL_TABLET | Freq: Once | ORAL | Status: AC
  Administered 2018-04-23: 4 mg via ORAL
  Filled 2018-04-23: qty 1

## 2018-04-23 NOTE — ED Notes (Signed)
Pt c/o nausea, dry heaving noted  

## 2018-04-23 NOTE — ED Provider Notes (Addendum)
[facility] EMERGENCY DEPARTMENT Provider Note   CSN: [id number] Arrival date & time: 04/22/18  2131     History   Chief Complaint Chief Complaint  Patient presents with  . Abdominal Pain    HPI Cindy Luna is a 37 y.o. female with past medical history of ESRD on dialysis Monday Wednesday Friday, lupus, diabetes, presenting to the ED with complaint of gradually worsening bilateral lower abdominal pain that began yesterday.  Pain is worse on the right side and feels like a bad menstrual cramp.  Pain is worse when laying on her back and radiates to her low back.  She has been treating symptoms with Midol and Tylenol without relief.  Reports associated nausea, vomiting, and diarrhea.  She states her vagina is throbbing.  No fevers, constipation, vaginal discharge, fever, or other complaints.  She went to dialysis today and completed treatment.  Reports history of ruptured ovarian cyst and states this feels very similar. Reports she just had STD testing done on Monday which was negative.  The history is provided by the patient.    Past Medical History:  Diagnosis Date  . Anemia   . Depression   . DM (diabetes mellitus) ([place])   . DUB (dysfunctional uterine bleeding) 06/24/2016  . End stage renal disease ([place])   . HLD (hyperlipidemia)   . HTN (hypertension)   . Lupus ([place])   . Ovarian cyst 09/01/2016  . Renal disorder   . Sleep apnea     Patient Active Problem List   Diagnosis Date Noted  . Class 3 severe obesity with serious comorbidity and body mass index (BMI) of 45.0 to 49.9 in adult ([place]) 04/18/2018  . Potential exposure to STD 04/18/2018  . Healthcare maintenance 04/18/2018  . Tobacco use disorder 04/18/2018  . Lymphadenopathy of head and neck 09/29/2017  . Irregular menses 08/28/2016  . OSA (obstructive sleep apnea) 12/10/2014  . ESRD (end stage renal disease) ([place]) 11/19/2014  . Lupus ([place]) 11/19/2014    Past Surgical History:  Procedure  Laterality Date  . AV FISTULA PLACEMENT  2016 right, 2009 left   left and right are both working   . CESAREAN SECTION    . OTHER SURGICAL HISTORY Left    AV fistula stents, arm  . TUBAL LIGATION  2010     OB History    Gravida  0   Para  0   Term  0   Preterm  0   AB  0   Living        SAB  0   TAB  0   Ectopic  0   Multiple      Live Births               Home Medications    Prior to Admission medications   Medication Sig Start Date End Date Taking? Authorizing Provider  cetirizine (ZYRTEC) 10 MG tablet Take 10 mg by mouth daily.    [provider]  cinacalcet (SENSIPAR) 60 MG tablet Take 60 mg by mouth daily with supper.     [provider]  multivitamin (RENA-VIT) TABS tablet Take 1 tablet by mouth daily. 02/21/18   [provider]  pantoprazole (PROTONIX) 40 MG tablet Take 40 mg by mouth daily. 03/22/16   [provider]  sevelamer (RENVELA) 800 MG tablet Take 800 mg by mouth as directed. 3 tablets with snacks and 5 tablets with meals    [provider]  Family History Family History  Problem Relation Age of Onset  . Hypertension Mother   . Diabetes Mother   . Autism Daughter   . Thyroid disease Sister   . Colon cancer Maternal Grandmother   . Prostate cancer Maternal Grandfather   . Breast cancer Maternal Aunt   . Diabetes Other        both sides of the fam  . Hypertension Other        both sides of the fam    Social History Social History   Tobacco Use  . Smoking status: Current Every Day Smoker    Types: Cigars    Start date: 2018  . Smokeless tobacco: Never Used  . Tobacco comment: 1 black and mild cigar every day   Substance Use Topics  . Alcohol use: No  . Drug use: No     Allergies   Azithromycin; Benadryl [diphenhydramine hcl]; Doxycycline hyclate; Vancomycin; and Adhesive [tape]   Review of Systems Review of Systems  Constitutional: Negative for fever.  Gastrointestinal:  Positive for abdominal pain, diarrhea, nausea and vomiting. Negative for constipation.  Genitourinary: Positive for vaginal pain. Negative for dysuria, frequency and vaginal discharge.  All other systems reviewed and are negative.    Physical Exam Updated Vital Signs BP (!) 155/89   Pulse 76   Temp 97.9 F (36.6 C) (Oral)   Resp 20   Ht 5\' 8"  (1.727 m)   Wt (!) 144.2 kg   LMP 03/27/2018   SpO2 98%   BMI 48.35 kg/m   Physical Exam  Constitutional: She appears well-developed and well-nourished. She does not appear ill. No distress.  Morbidly obese.  Not distressed.  HENT:  Head: Normocephalic and atraumatic.  Eyes: Conjunctivae are normal.  Cardiovascular: Normal rate, regular rhythm and intact distal pulses.  Pulmonary/Chest: Effort normal and breath sounds normal.  Abdominal: Soft. Bowel sounds are normal. She exhibits no distension and no mass. There is tenderness in the right lower quadrant, epigastric area and left upper quadrant. There is no rebound and no guarding.  Genitourinary: Vagina normal. Cervix exhibits no motion tenderness. Right adnexum displays no tenderness. Left adnexum displays no tenderness.  Genitourinary Comments: Exam performed with female nurse tech chaperone present.  Scant white discharge  Neurological: She is alert.  Skin: Skin is warm.  Psychiatric: She has a normal mood and affect. Her behavior is normal.  Nursing note and vitals reviewed.    ED Treatments / Results  Labs (all labs ordered are listed, but only abnormal results are displayed) Labs Reviewed  WET PREP, GENITAL - Abnormal; Notable for the following components:      Result Value   Clue Cells Wet Prep HPF POC PRESENT (*)    WBC, Wet Prep HPF POC FEW (*)    All other components within normal limits  COMPREHENSIVE METABOLIC PANEL - Abnormal; Notable for the following components:   Chloride 92 (*)    Glucose, Bld 103 (*)    Creatinine, Ser 6.89 (*)    AST 8 (*)    GFR calc non  Af Amer 7 (*)    GFR calc Af Amer 8 (*)    All other components within normal limits  CBC - Abnormal; Notable for the following components:   RBC 3.82 (*)    Hemoglobin 11.8 (*)    All other components within normal limits  LIPASE, BLOOD  URINALYSIS, ROUTINE W REFLEX MICROSCOPIC  I-STAT BETA HCG BLOOD, ED ([facility], [facility], [facility] ONLY)  EKG None  Radiology No results found.  Procedures Procedures (including critical care time)  Medications Ordered in ED Medications  ondansetron (ZOFRAN-ODT) disintegrating tablet 4 mg (4 mg Oral Given 04/23/18 0352)  morphine 4 MG/ML injection 4 mg (4 mg Intramuscular Given 04/23/18 0353)     Initial Impression / Assessment and Plan / ED Course  I have reviewed the triage vital signs and the nursing notes.  Pertinent labs & imaging results that were available during my care of the patient were reviewed by me and considered in my medical decision making (see chart for details).    Pt presenting with gradually worsening b/l lower abd pain since yesterday. Assoc n/v. States pain feels similar to hx ruptured ovarian cysts, though worse. On exam, abd is TTP RLQ and LUQ, pelvic exam without CMT or adnexal tenderness.  No leukocytosis.  Pain and nausea  treated with improvement though abdomen still tender. CT abd pending when pt eloped. I was unable to discuss risks of leaving with patient prior to her leaving this ED. Differential dx includes acute appendicitis, ruptured ovarian cyst, SBO, colitis.    Final Clinical Impressions(s) / ED Diagnoses   Final diagnoses:  None    ED Discharge Orders    None       Robinson, [place] N, PA-C 04/23/18 0628    Robinson, [place] N, PA-C 04/23/18 5053    Randal Buba, [place], MD 04/23/18 [phone]

## 2018-04-23 NOTE — ED Notes (Addendum)
Pt came to tech first and asked to let someone know that she was leaving. Pt walked out the door.

## 2018-04-23 NOTE — ED Notes (Signed)
  Patient came out of her room and said she was going home.  Patient started walking to the exit and could not be convinced to stay.  MD notified.

## 2018-04-23 NOTE — ED Notes (Signed)
Pt and family seen walking down hall towards the waiting room, steady gait, NAD. Primary RN and MD notified

## 2018-04-29 ENCOUNTER — Ambulatory Visit (INDEPENDENT_AMBULATORY_CARE_PROVIDER_SITE_OTHER)
Admission: EM | Admit: 2018-04-29 | Discharge: 2018-04-29 | Disposition: A | Discharge status: Home / Self Care | Payer: Medicare Other | Source: Home / Self Care | Attending: Family Medicine | Admitting: Family Medicine

## 2018-04-29 ENCOUNTER — Encounter (HOSPITAL_COMMUNITY): Payer: Self-pay | Admitting: Emergency Medicine

## 2018-04-29 ENCOUNTER — Emergency Department (HOSPITAL_COMMUNITY)
Admission: EM | Admit: 2018-04-29 | Discharge: 2018-04-29 | Disposition: A | Discharge status: Home / Self Care | Payer: Medicare Other | Attending: Emergency Medicine | Admitting: Emergency Medicine

## 2018-04-29 ENCOUNTER — Emergency Department (HOSPITAL_COMMUNITY): Payer: Medicare Other

## 2018-04-29 ENCOUNTER — Other Ambulatory Visit: Payer: Self-pay

## 2018-04-29 DIAGNOSIS — M3210 Systemic lupus erythematosus, organ or system involvement unspecified: Secondary | ICD-10-CM | POA: Insufficient documentation

## 2018-04-29 DIAGNOSIS — E1122 Type 2 diabetes mellitus with diabetic chronic kidney disease: Secondary | ICD-10-CM | POA: Insufficient documentation

## 2018-04-29 DIAGNOSIS — K5900 Constipation, unspecified: Secondary | ICD-10-CM | POA: Diagnosis not present

## 2018-04-29 DIAGNOSIS — R1030 Lower abdominal pain, unspecified: Secondary | ICD-10-CM | POA: Diagnosis not present

## 2018-04-29 DIAGNOSIS — I120 Hypertensive chronic kidney disease with stage 5 chronic kidney disease or end stage renal disease: Secondary | ICD-10-CM | POA: Insufficient documentation

## 2018-04-29 DIAGNOSIS — F17290 Nicotine dependence, other tobacco product, uncomplicated: Secondary | ICD-10-CM | POA: Insufficient documentation

## 2018-04-29 DIAGNOSIS — N186 End stage renal disease: Secondary | ICD-10-CM | POA: Diagnosis not present

## 2018-04-29 DIAGNOSIS — R1031 Right lower quadrant pain: Secondary | ICD-10-CM | POA: Diagnosis not present

## 2018-04-29 LAB — COMPREHENSIVE METABOLIC PANEL
ALK PHOS: 65 U/L (ref 38–126)
ALT: 9 U/L (ref 0–44)
AST: 10 U/L — ABNORMAL LOW (ref 15–41)
Albumin: 4 g/dL (ref 3.5–5.0)
Anion gap: 15 (ref 5–15)
BUN: 14 mg/dL (ref 6–20)
CALCIUM: 9.3 mg/dL (ref 8.9–10.3)
CO2: 23 mmol/L (ref 22–32)
Chloride: 98 mmol/L (ref 98–111)
Creatinine, Ser: 6.18 mg/dL — ABNORMAL HIGH (ref 0.44–1.00)
GFR, EST AFRICAN AMERICAN: 9 mL/min — AB (ref >60)
GFR, EST NON AFRICAN AMERICAN: 8 mL/min — AB (ref >60)
Glucose, Bld: 110 mg/dL — ABNORMAL HIGH (ref 70–99)
Potassium: 3.4 mmol/L — ABNORMAL LOW (ref 3.5–5.1)
SODIUM: 136 mmol/L (ref 135–145)
Total Bilirubin: 0.5 mg/dL (ref 0.3–1.2)
Total Protein: 9.1 g/dL — ABNORMAL HIGH (ref 6.5–8.1)

## 2018-04-29 LAB — CBC
HCT: 36.4 % (ref 36.0–46.0)
Hemoglobin: 12.3 g/dL (ref 12.0–15.0)
MCH: 31.3 pg (ref 26.0–34.0)
MCHC: 33.8 g/dL (ref 30.0–36.0)
MCV: 92.6 fL (ref 78.0–100.0)
PLATELETS: 273 10*3/uL (ref 150–400)
RBC: 3.93 MIL/uL (ref 3.87–5.11)
RDW: 14.4 % (ref 11.5–15.5)
WBC: 10.4 10*3/uL (ref 4.0–10.5)

## 2018-04-29 LAB — LIPASE, BLOOD: Lipase: 48 U/L (ref 11–51)

## 2018-04-29 LAB — I-STAT BETA HCG BLOOD, ED (MC, WL, AP ONLY)

## 2018-04-29 MED ORDER — POLYETHYLENE GLYCOL 3350 17 G PO PACK: 17.0000 g | PACK | Freq: Every day | ORAL | 0 refills | Status: DC

## 2018-04-29 MED ORDER — OXYCODONE HCL 5 MG PO TABS
10.0000 mg | ORAL_TABLET | Freq: Once | ORAL | Status: AC
  Administered 2018-04-29: 10 mg via ORAL
  Filled 2018-04-29: qty 2

## 2018-04-29 MED ORDER — DICYCLOMINE HCL 20 MG PO TABS: 20.0000 mg | ORAL_TABLET | Freq: Two times a day (BID) | ORAL | 0 refills | Status: DC

## 2018-04-29 NOTE — ED Provider Notes (Signed)
[facility]    CSN: [id number] Arrival date & time: 04/29/18  1508     History   Chief Complaint Chief Complaint  Patient presents with  . Groin Pain    right  . Back Pain    right    HPI Cindy Luna is a 37 y.o. female.   HPI Patient's been having abdominal pain right lower quadrant for the last week.  It was severe on 04/22/2018.  States it radiates to her right low back.  She went to the emergency department.  She states she was there for 9 hours.  The lab work did not show anything alarming.  She was told that she needed a CAT scan.  She states she is afraid to have a CAT scan because of her end-stage renal disease.  In any event her ride was leaving so she "eloped" prior to having a CAT scan done.  She went home on her usual medications.  She is still having right lower quadrant pain.  No fever or chills.  No appetite.  No bowel movements.  Nausea but no vomiting.  She states the pain comes in waves.  At times it severe.  It is present all of the time.  No vaginal bleeding.  No urinary symptoms.  Because of her dialysis she urinates very little.  Urinalysis was not done while in the emergency room, patient was unable to provide specimen. Past Medical History:  Diagnosis Date  . Anemia   . Depression   . DM (diabetes mellitus) ([place])   . DUB (dysfunctional uterine bleeding) 06/24/2016  . End stage renal disease ([place])   . HLD (hyperlipidemia)   . HTN (hypertension)   . Lupus ([place])   . Ovarian cyst 09/01/2016  . Renal disorder   . Sleep apnea     Patient Active Problem List   Diagnosis Date Noted  . Class 3 severe obesity with serious comorbidity and body mass index (BMI) of 45.0 to 49.9 in adult ([place]) 04/18/2018  . Potential exposure to STD 04/18/2018  . Healthcare maintenance 04/18/2018  . Tobacco use disorder 04/18/2018  . Lymphadenopathy of head and neck 09/29/2017  . Irregular menses 08/28/2016  . OSA (obstructive sleep apnea) 12/10/2014  . ESRD (end  stage renal disease) ([place]) 11/19/2014  . Lupus ([place]) 11/19/2014    Past Surgical History:  Procedure Laterality Date  . AV FISTULA PLACEMENT  2016 right, 2009 left   left and right are both working   . CESAREAN SECTION    . OTHER SURGICAL HISTORY Left    AV fistula stents, arm  . TUBAL LIGATION  2010    OB History    Gravida  0   Para  0   Term  0   Preterm  0   AB  0   Living        SAB  0   TAB  0   Ectopic  0   Multiple      Live Births               Home Medications    Prior to Admission medications   Medication Sig Start Date End Date Taking? Authorizing Provider  cetirizine (ZYRTEC) 10 MG tablet Take 10 mg by mouth daily.   Yes [provider]  cinacalcet (SENSIPAR) 60 MG tablet Take 60 mg by mouth daily with supper.    Yes [provider]  multivitamin (RENA-VIT) TABS tablet Take 1 tablet by mouth  daily. 02/21/18  Yes [provider]  pantoprazole (PROTONIX) 40 MG tablet Take 40 mg by mouth daily. 03/22/16  Yes [provider]  sevelamer (RENVELA) 800 MG tablet Take 800 mg by mouth as directed. 3 tablets with snacks and 5 tablets with meals   Yes [provider]    Family History Family History  Problem Relation Age of Onset  . Hypertension Mother   . Diabetes Mother   . Autism Daughter   . Thyroid disease Sister   . Colon cancer Maternal Grandmother   . Prostate cancer Maternal Grandfather   . Breast cancer Maternal Aunt   . Diabetes Other        both sides of the fam  . Hypertension Other        both sides of the fam    Social History Social History   Tobacco Use  . Smoking status: Current Every Day Smoker    Types: Cigars    Start date: 2018  . Smokeless tobacco: Never Used  . Tobacco comment: 1 black and mild cigar every day   Substance Use Topics  . Alcohol use: No  . Drug use: No     Allergies   Azithromycin; Benadryl [diphenhydramine hcl]; Doxycycline hyclate; Vancomycin;  and Adhesive [tape]   Review of Systems Review of Systems  Constitutional: Positive for fatigue. Negative for chills and fever.  HENT: Negative for congestion, ear pain and sore throat.   Eyes: Negative for pain and visual disturbance.  Respiratory: Negative for cough and shortness of breath.   Cardiovascular: Negative for chest pain and palpitations.  Gastrointestinal: Positive for abdominal pain and nausea. Negative for vomiting.  Genitourinary: Negative for dysuria, flank pain, hematuria, vaginal bleeding and vaginal discharge.  Musculoskeletal: Positive for back pain. Negative for arthralgias.  Skin: Negative for color change and rash.  Neurological: Negative for dizziness, seizures and syncope.  Psychiatric/Behavioral: Positive for sleep disturbance.  All other systems reviewed and are negative.    Physical Exam Triage Vital Signs ED Triage Vitals  Enc Vitals Group     BP 04/29/18 1547 (!) 154/72     Pulse Rate 04/29/18 1547 91     Resp --      Temp 04/29/18 1547 98 F (36.7 C)     Temp Source 04/29/18 1547 Oral     SpO2 04/29/18 1547 100 %     Weight --      Height --      Head Circumference --      Peak Flow --      Pain Score 04/29/18 1548 10     Pain Loc --      Pain Edu? --      Excl. in [place]? --    No data found.  Updated Vital Signs BP (!) 154/72 (BP Location: Left Leg)   Pulse 91   Temp 98 F (36.7 C) (Oral)   SpO2 100%      Physical Exam  Constitutional: She appears well-developed and well-nourished. She appears distressed.  Obviously discomfort.  Holds abdomen with both arms.  Leans towards right side.  HENT:  Head: Normocephalic and atraumatic.  Mouth/Throat: Oropharynx is clear and moist.  Eyes: Pupils are equal, round, and reactive to light. Conjunctivae are normal.  Neck: Normal range of motion.  Cardiovascular: Normal rate, regular rhythm and normal heart sounds.  Pulmonary/Chest: Effort normal and breath sounds normal. No respiratory  distress.  Abdominal: Soft. She exhibits no distension. There is tenderness.  Acutely tender right lower quadrant and groin region.  No palpable mass.  Patient unable to get on exam room table for complete evaluation.  Musculoskeletal: Normal range of motion. She exhibits no edema.  Neurological: She is alert.  Skin: Skin is warm and dry.  Vitals reviewed.    UC Treatments / Results  Labs (all labs ordered are listed, but only abnormal results are displayed) Labs Reviewed - No data to display  EKG None  Radiology No results found.  Procedures Procedures (including critical care time)  Medications Ordered in UC Medications - No data to display  Initial Impression / Assessment and Plan / UC Course  I have reviewed the triage vital signs and the nursing notes.  Pertinent labs & imaging results that were available during my care of the patient were reviewed by me and considered in my medical decision making (see chart for details).     Explained to the patient that she needed imaging to determine what was going well with her low abdominal pain.  I have recommended she go to the emergency room. Final Clinical Impressions(s) / UC Diagnoses   Final diagnoses:  Acute abdominal pain in right lower quadrant     Discharge Instructions     You need to be seen in the ER I will call triage for you   ED Prescriptions    None     Controlled Substance Prescriptions [place] Controlled Substance Registry consulted? Not Applicable   Raylene Everts, MD 04/29/18 [facility] [place]age[place]

## 2018-04-29 NOTE — Discharge Instructions (Signed)
You need to be seen in the ER I will call triage for you

## 2018-04-29 NOTE — ED Provider Notes (Signed)
[facility] Emergency Department Provider Note MRN:  [id number]  Arrival date & time: 04/29/18     Chief Complaint   Abdominal Pain; Back Pain; and Groin Pain   History of Present Illness   Cindy Luna is a 37 y.o. year-old female with a history of diabetes, lupus, ESRD presenting to the ED with chief complaint of abdominal pain.  Pain is located in the lower abdomen, right greater than left.  The pain has been present for 8 days.  Gradual onset, progressively worsening.  Patient was in the emergency department a few days ago but had to leave before her CT scan was done.  Patient denies recent fevers, no headache or vision change, no chest pain or shortness of breath, no upper abdominal pain, no vaginal bleeding or discharge.  No exacerbating or relieving factors.  Pain is constant, 8 out of 10 in severity.  Review of Systems  A complete 10 system review of systems was obtained and all systems are negative except as noted in the HPI and PMH.   Patient's Health History    Past Medical History:  Diagnosis Date  . Anemia   . Depression   . DM (diabetes mellitus) ([place])   . DUB (dysfunctional uterine bleeding) 06/24/2016  . End stage renal disease ([place])   . HLD (hyperlipidemia)   . HTN (hypertension)   . Lupus ([place])   . Ovarian cyst 09/01/2016  . Renal disorder   . Sleep apnea     Past Surgical History:  Procedure Laterality Date  . AV FISTULA PLACEMENT  2016 right, 2009 left   left and right are both working   . CESAREAN SECTION    . OTHER SURGICAL HISTORY Left    AV fistula stents, arm  . TUBAL LIGATION  2010    Family History  Problem Relation Age of Onset  . Hypertension Mother   . Diabetes Mother   . Autism Daughter   . Thyroid disease Sister   . Colon cancer Maternal Grandmother   . Prostate cancer Maternal Grandfather   . Breast cancer Maternal Aunt   . Diabetes Other        both sides of the fam  . Hypertension Other        both sides of  the fam    Social History   Socioeconomic History  . Marital status: Single    Spouse name: Not on file  . Number of children: 1  . Years of education: Not on file  . Highest education level: Not on file  Occupational History  . Not on file  Social Needs  . Financial resource strain: Not on file  . Food insecurity:    Worry: Not on file    Inability: Not on file  . Transportation needs:    Medical: Not on file    Non-medical: Not on file  Tobacco Use  . Smoking status: Current Every Day Smoker    Types: Cigars    Start date: 2018  . Smokeless tobacco: Never Used  . Tobacco comment: 1 black and mild cigar every day   Substance and Sexual Activity  . Alcohol use: No  . Drug use: No  . Sexual activity: Yes    Partners: Male    Birth control/protection: None  Lifestyle  . Physical activity:    Days per week: Not on file    Minutes per session: Not on file  . Stress: Not on file  Relationships  .  Social connections:    Talks on phone: Not on file    Gets together: Not on file    Attends religious service: Not on file    Active member of club or organization: Not on file    Attends meetings of clubs or organizations: Not on file    Relationship status: Not on file  . Intimate partner violence:    Fear of current or ex partner: Not on file    Emotionally abused: Not on file    Physically abused: Not on file    Forced sexual activity: Not on file  Other Topics Concern  . Not on file  Social History Narrative   ** Merged History Encounter **       Level of education: college    Employment: unemployed    Transportation: car    Exercise: no   Housing situation: apt   Relationships (safe): yes   Contact for message (voicemail): [phone]     Physical Exam  Vital Signs and Nursing Notes reviewed Vitals:   04/29/18 1710 04/29/18 2202  BP: (!) 192/120 (!) 156/72  Pulse: 87 86  Resp: 18 18  Temp: 98.3 F (36.8 C)   SpO2: 96% 100%    CONSTITUTIONAL:  Well-appearing, obese, NAD NEURO:  Alert and oriented x 3, no focal deficits EYES:  eyes equal and reactive ENT/NECK:  no LAD, no JVD CARDIO: Regular rate, well-perfused, normal S1 and S2 PULM:  CTAB no wheezing or rhonchi GI/GU:  normal bowel sounds, non-distended, moderate lower abdominal tenderness, right greater than left MSK/SPINE:  No gross deformities, no edema SKIN:  no rash, atraumatic PSYCH:  Appropriate speech and behavior  Diagnostic and Interventional Summary    EKG Interpretation  Date/Time:    Ventricular Rate:    PR Interval:    QRS Duration:   QT Interval:    QTC Calculation:   R Axis:     Text Interpretation:        Labs Reviewed  COMPREHENSIVE METABOLIC PANEL - Abnormal; Notable for the following components:      Result Value   Potassium 3.4 (*)    Glucose, Bld 110 (*)    Creatinine, Ser 6.18 (*)    Total Protein 9.1 (*)    AST 10 (*)    GFR calc non Af Amer 8 (*)    GFR calc Af Amer 9 (*)    All other components within normal limits  LIPASE, BLOOD  CBC  URINALYSIS, ROUTINE W REFLEX MICROSCOPIC  I-STAT BETA HCG BLOOD, ED ([facility], [facility], [facility] ONLY)    CT ABDOMEN PELVIS WO CONTRAST  Final Result      Medications  oxyCODONE (Oxy IR/ROXICODONE) immediate release tablet 10 mg (10 mg Oral Given 04/29/18 2100)     Procedures Critical Care  ED Course and Medical Decision Making  I have reviewed the triage vital signs and the nursing notes.  Pertinent labs & imaging results that were available during my care of the patient were reviewed by me and considered in my medical decision making (see below for details).  Considering appendicitis versus diverticulitis versus ovarian cyst rupture, noncontrast CT to follow given her renal function.  CT abdomen with no acute process.  Irregularity of the right femoral head is a known condition per patient.  Patient is endorsing constipation for the past 3 to 4 days.  Labs are reassuring.  Prescription for MiraLAX,  Bentyl, patient has close follow-up with her GYN as well as PCP.  After  the discussed management above, the patient was determined to be safe for discharge.  The patient was in agreement with this plan and all questions regarding their care were answered.  ED return precautions were discussed and the patient will return to the ED with any significant worsening of condition.  Barth Kirks. Sedonia Small, MD [place] [email or web address]  Final Clinical Impressions(s) / ED Diagnoses     ICD-10-CM   1. Lower abdominal pain R10.30   2. Constipation, unspecified constipation type K59.00     ED Discharge Orders         Ordered    polyethylene glycol (MIRALAX) packet  Daily     04/29/18 2307    dicyclomine (BENTYL) 20 MG tablet  2 times daily     04/29/18 2307             Maudie Flakes, MD 04/29/18 2316

## 2018-04-29 NOTE — ED Triage Notes (Signed)
Pt reports right groin pain and right lower back pain since last Thursday.  She was seen in the ED on 04/23/18 for her symptoms and had just seen her PCP a few days before that.  Pt reports she has an Ob/Gyn appt next Tuesday but the pain is too much to wait that long.

## 2018-04-29 NOTE — Discharge Instructions (Signed)
You were evaluated in the Emergency Department and after careful evaluation, we did not find any emergent condition requiring admission or further testing in the hospital. ° °Please return to the Emergency Department if you experience any worsening of your condition.  We encourage you to follow up with a primary care provider.  Thank you for allowing us to be a part of your care. °

## 2018-04-29 NOTE — ED Notes (Signed)
3 gold top tubes in main lab

## 2018-04-29 NOTE — ED Triage Notes (Signed)
Patient seen at urgent care with complaints of right groin pain and right lower back pain since last Thursday.  She was seen here for same on 9/28 and PCP with no relief. OB appt next Tuesday, but states that she cannot wait.

## 2018-05-03 ENCOUNTER — Encounter: Payer: Self-pay | Admitting: Obstetrics and Gynecology

## 2018-05-03 ENCOUNTER — Ambulatory Visit (INDEPENDENT_AMBULATORY_CARE_PROVIDER_SITE_OTHER): Payer: Medicare Other | Admitting: Obstetrics and Gynecology

## 2018-05-03 DIAGNOSIS — N946 Dysmenorrhea, unspecified: Secondary | ICD-10-CM | POA: Insufficient documentation

## 2018-05-03 NOTE — Progress Notes (Signed)
Patient ID: Cindy Luna, female   DOB: 08/14/1980, 37 y.o.   MRN: [id number] [name] presents fro ER following abd/pelvic pain. Sx started 2 weeks ago. Thought to be constipation related. CT scan no definite pathology except for some ovarian follicles. Sx resolved with onset of cycle. This months cycle was heavier then last months cycles. Pt has been having more regular monthly bleeding for the last yr. However usually just dark spotting. Pt has similar Sx with over 6 yrs ago. Pt with H/O Novasure and BTL in 2010.  PMH significant for HTN, Lupus, ESRD, OSA and obesity.  PE AF VSS Lungs clear Heart RRR Abd soft + BS  A/P Dysmenorrhea  Tx options reviewed with pt. Medication OCP's, IUD, repeat ablation and hyst. Pt desires to follow Sx for now. Will F/U PRN

## 2018-12-01 ENCOUNTER — Other Ambulatory Visit: Payer: Self-pay

## 2018-12-01 ENCOUNTER — Encounter: Payer: Self-pay | Admitting: Family Medicine

## 2018-12-01 ENCOUNTER — Ambulatory Visit (INDEPENDENT_AMBULATORY_CARE_PROVIDER_SITE_OTHER): Payer: Medicare Other | Admitting: Family Medicine

## 2018-12-01 VITALS — BP 130/80 | HR 94

## 2018-12-01 DIAGNOSIS — N764 Abscess of vulva: Secondary | ICD-10-CM

## 2018-12-01 NOTE — Progress Notes (Signed)
    Subjective:  Cindy Luna is a 38 y.o. female who presents to the [facility] today with a chief complaint of boil in vaginal area.   HPI:  Noticed a bump in her private area several days ago. Has been doing warm compresses and sitz baths. Last night it self drained a lot of pus. Has n3ever had anything like this before. No vaginal discharge or itching. No urinary symptoms. No fever.  ROS: Per HPI   Objective:  Physical Exam: BP 130/80   Pulse 94   LMP 10/27/2018   SpO2 96%   Gen: NAD, resting comfortably Pulm: NWOB Skin: R labia with 2-3 cm area of induration with central opening draining slight serosanguinous fluid, mildly TTP. No fluctuance. Mild erythema.   Neuro: grossly normal, moves all extremities Psych: Normal affect and thought content   Assessment/Plan:  1. Labial abscess Resolving labial abscess. Patient body habitus likely contributed. Does not appears to have residual area of fluctuance and no systemic systemic so will treat conservatively with good wound care. Discussed possible need for antibiotics if worsens. Patient voiced good understanding.   Bufford Lope, DO PGY-3, [facility] 12/01/2018 3:36 PM

## 2018-12-01 NOTE — Patient Instructions (Signed)
Keep clean with warm water and soap.  It will continue to drain.   Let the area get some air.    Skin Abscess  A skin abscess is an infected area of your skin that contains pus and other material. An abscess can happen in any part of your body. Some abscesses break open (rupture) on their own. Most continue to get worse unless they are treated. The infection can spread deeper into the body and into your blood, which can make you feel sick. A skin abscess is caused by germs that enter the skin through a cut or scrape. It can also be caused by blocked oil and sweat glands or infected hair follicles. This condition is usually treated by:  Draining the pus.  Taking antibiotic medicines.  Placing a warm, wet washcloth over the abscess. Follow these instructions at home: Medicines   Take over-the-counter and prescription medicines only as told by your doctor.  If you were prescribed an antibiotic medicine, take it as told by your doctor. Do not stop taking the antibiotic even if you start to feel better. Abscess care   If you have an abscess that has not drained, place a warm, clean, wet washcloth over the abscess several times a day. Do this as told by your doctor.  Follow instructions from your doctor about how to take care of your abscess. Make sure you: ? Cover the abscess with a bandage (dressing). ? Change your bandage or gauze as told by your doctor. ? Wash your hands with soap and water before you change the bandage or gauze. If you cannot use soap and water, use hand sanitizer.  Check your abscess every day for signs that the infection is getting worse. Check for: ? More redness, swelling, or pain. ? More fluid or blood. ? Warmth. ? More pus or a bad smell. General instructions  To avoid spreading the infection: ? Do not share personal care items, towels, or hot tubs with others. ? Avoid making skin-to-skin contact with other people.  Keep all follow-up visits as told  by your doctor. This is important. Contact a doctor if:  You have more redness, swelling, or pain around your abscess.  You have more fluid or blood coming from your abscess.  Your abscess feels warm when you touch it.  You have more pus or a bad smell coming from your abscess.  You have a fever.  Your muscles ache.  You have chills.  You feel sick. Get help right away if:  You have very bad (severe) pain.  You see red streaks on your skin spreading away from the abscess. Summary  A skin abscess is an infected area of your skin that contains pus and other material.  The abscess is caused by germs that enter the skin through a cut or scrape. It can also be caused by blocked oil and sweat glands or infected hair follicles.  Follow your doctor's instructions on caring for your abscess, taking medicines, preventing infections, and keeping follow-up visits. This information is not intended to replace advice given to you by your health care provider. Make sure you discuss any questions you have with your health care provider. Document Released: 12/30/2007 Document Revised: 08/26/2017 Document Reviewed: 08/26/2017 Elsevier Interactive Patient Education  2019 [employer].

## 2019-01-04 ENCOUNTER — Other Ambulatory Visit: Payer: Self-pay

## 2019-01-04 ENCOUNTER — Telehealth (INDEPENDENT_AMBULATORY_CARE_PROVIDER_SITE_OTHER): Payer: Medicare Other | Admitting: Family Medicine

## 2019-01-04 DIAGNOSIS — R0789 Other chest pain: Secondary | ICD-10-CM | POA: Insufficient documentation

## 2019-01-04 NOTE — Assessment & Plan Note (Signed)
Patient currently in dialysis and has been examined by nephrologist.  Heart score of 1 without troponin.  Patient low risk for cardiac event however given history of lupus and ESRD with wheezing on exam and no known lung disease- patient should be evaluated in office.  Her pain did improve with Tylenol which is also less concerning for cardiac etiology. -Scheduled for ATC on 6/11 in the morning.   -Given strict return precautions and when patient should go to the ED.

## 2019-01-04 NOTE — Progress Notes (Signed)
[place] Telemedicine Visit  Patient consented to have virtual visit. Method of visit: Video  Encounter participants: Patient: Cindy Luna - located at home Provider: [place] Prarthana Parlin - located at [facility] Others (if applicable): n/a  Chief Complaint: chest tightness  HPI:  Having tightness in her chest that started last week on Wednesday- felt like a cramp on the right. She said it now moving to the middle of her chest.  She is currently at dialysis and has a nephrologist listen to her lungs who told her that he heard some wheezing. No hx asthma, COPD- does not have any inhalers at home.  She denies any shortness of breath.  Given oxygen at dialysis and that helped. States that she had this discussion with her nephrologist before who told her to use Aleve last week.  However patient used Tylenol which helped with pain, but now she is having a different sensation that she describes as a tightness since Monday.  Patient with history of OSA but is currently doing a sleep study in order to obtain a CPAP.  She denies any exertional worsening of her chest pain or exertional dyspnea.  ROS: per HPI  Pertinent PMHx: ESRD, OSA, lupus  Exam:  General: Patient well-appearing over video Respiratory: Able to talk in complete sentences  Assessment/Plan:  Chest tightness Patient currently in dialysis and has been examined by nephrologist.  Heart score of 1 without troponin.  Patient low risk for cardiac event however given history of lupus and ESRD with wheezing on exam and no known lung disease- patient should be evaluated in office.  Her pain did improve with Tylenol which is also less concerning for cardiac etiology. -Scheduled for ATC on 6/11 in the morning.   -Given strict return precautions and when patient should go to the ED.     Time spent during visit with patient: 9 minutes

## 2019-01-05 ENCOUNTER — Other Ambulatory Visit: Payer: Self-pay

## 2019-01-05 ENCOUNTER — Ambulatory Visit (INDEPENDENT_AMBULATORY_CARE_PROVIDER_SITE_OTHER): Payer: Medicare Other | Admitting: Family Medicine

## 2019-01-05 ENCOUNTER — Ambulatory Visit (HOSPITAL_COMMUNITY)
Admission: RE | Admit: 2019-01-05 | Discharge: 2019-01-05 | Disposition: A | Discharge status: Home / Self Care | Payer: Medicare Other | Source: Ambulatory Visit | Attending: Family Medicine | Admitting: Family Medicine

## 2019-01-05 VITALS — BP 132/82 | HR 99 | Ht 68.0 in | Wt 314.0 lb

## 2019-01-05 DIAGNOSIS — R0789 Other chest pain: Secondary | ICD-10-CM

## 2019-01-05 NOTE — Progress Notes (Signed)
Subjective:    Patient ID: Cindy Luna, female    DOB: [birth year], 38 y.o.   MRN: [id number]   CC: Chest tightness   HPI: Patient is a 38 yo female with a past medical history significant for ESRD on HD, type 2 diabetes, hypertension who present today complaining of chest tightness. Patient reports chest tightness started 3 days ago.  Patient states she is cramp-like pain in her right breast which subsequently developed into mild chest pressure in the middle of her chest.  Pressure does not worsen with exertion or activity.  She denies any jaw pain, diaphoresis, numbness or tingling down her arm, dizziness.  She endorses one episode of mild shortness of breath but this seems to be a chronic issue. Patient reports that while on dialysis yesterday nephrologist visited her and heard some wheezing.  Patient has allergies for which she takes Zyrtec.  No history of asthma and is not currently using any inhaler.  This morning, chest tightness has resolved.  Patient was told by nephrologist to see PCP for further evaluation and possible need for inhaler.  Patient does have OSA is currently undergoing a sleep study to qualify for CPAP. Patient denies any similar symptoms in the past.  Patient denies any abdominal pain, nausea, fever, chills, headaches.   Smoking status reviewed   ROS: all other systems were reviewed and are negative other than in the HPI   Past Medical History:  Diagnosis Date  . Anemia   . Depression   . DM (diabetes mellitus) ([place])   . DUB (dysfunctional uterine bleeding) 06/24/2016  . End stage renal disease ([place])   . HLD (hyperlipidemia)   . HTN (hypertension)   . Lupus ([place])   . Ovarian cyst 09/01/2016  . Renal disorder   . Sleep apnea     Past Surgical History:  Procedure Laterality Date  . AV FISTULA PLACEMENT  2016 right, 2009 left   left and right are both working   . CESAREAN SECTION    . OTHER SURGICAL HISTORY Left    AV fistula stents, arm  . TUBAL LIGATION   2010    Past medical history, surgical, family, and social history reviewed and updated in the EMR as appropriate.  Objective:  BP 132/82   Pulse 99   Ht 5\' 8"  (1.727 m)   Wt (!) 314 lb (142.4 kg)   LMP 01/05/2019   SpO2 99%   BMI 47.74 kg/m   Vitals and nursing note reviewed  General: NAD, pleasant, able to participate in exam Cardiac: RRR, normal heart sounds, systolic murmur. 2+ radial and PT pulses bilaterally Respiratory: CTAB, normal effort, No wheezes, rales or rhonchi Abdomen: soft, nontender, nondistended, no hepatic or splenomegaly, +BS Extremities: no edema or cyanosis. WWP. Skin: warm and dry, no rashes noted Neuro: alert and oriented x4, no focal deficits Psych: Normal affect and mood   Assessment & Plan:   Chest tightness Patient presents today with chest tightness for the past few days.  Symptoms are atypical.  EKG with normal sinus rhythm.  Heart exam without systolic murmur likely secondary to AV fistula.  No tenderness to palpation making musculoskeletal etiology for chest tightness less likely.  Pulmonary exam without any wheezing or crackles less concerning for pulmonary process.  No lower extremity swelling making possible DVT/PE less likely.  Patient is well-appearing and symptoms have significantly improved since Monday.  No concerning findings today.  Patient could have CAD given she has ESRD and has been  on dialysis for the past 10 years.  Patient also being evaluated for CPAP as she has OSA.  Shortness of breath could be secondary to OSA though last echo in 2016 did not show any elevation in PA pressure and had mild diastolic dysfunction.  Patient is also obese and chest tightness could be secondary to obesity hypoventilation.  Recommend patient keep monitoring her symptoms and return to clinic if chest tightness worsen with exertion or is experiencing worsening shortness of breath.   Marjie Skiff, MD [place] PGY-3

## 2019-01-05 NOTE — Assessment & Plan Note (Addendum)
Patient presents today with chest tightness for the past few days.  Symptoms are atypical.  EKG with normal sinus rhythm.  Heart exam without systolic murmur likely secondary to AV fistula.  No tenderness to palpation making musculoskeletal etiology for chest tightness less likely.  Pulmonary exam without any wheezing or crackles less concerning for pulmonary process.  No lower extremity swelling making possible DVT/PE less likely.  Patient is well-appearing and symptoms have significantly improved since Monday.  No concerning findings today.  Patient could have CAD given she has ESRD and has been on dialysis for the past 10 years.  Patient also being evaluated for OS and completed her sleep study last night and waiting for results. Shortness of breath could be secondary to OSA though last echo in 2016 did not show any elevation in PA pressure and had mild diastolic dysfunction.  Patient is also obese and chest tightness could be secondary to obesity hypoventilation.Patient has GERD and is currently on PPI, no symptoms of GERD.  Recommend patient keep monitoring her symptoms and return to clinic if chest tightness worsen with exertion or is experiencing worsening shortness of breath. She might need repeat ECHO.

## 2019-05-21 IMAGING — CT CT ABD-PELV W/O CM
2 of 4 series · 17 of 46 positions shown, 19 images · non-contrast
Comparison: None.

CLINICAL DATA: Right groin pain and lower back pain for several
days. History of end-stage renal disease.

EXAM:
CT ABDOMEN AND PELVIS WITHOUT CONTRAST
TECHNIQUE: Multidetector CT imaging of the abdomen and pelvis was performed
following the standard protocol without IV contrast.

[Series 2: axial st · axial · 0.92mm/px · z∈[-610,-205]mm · 14 of 91 slices shown, 16 images]
[im 5/91  soft-tissue]
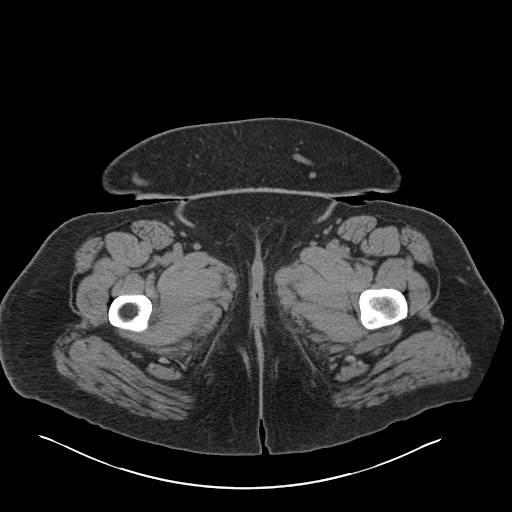
[im 5/91  bone]
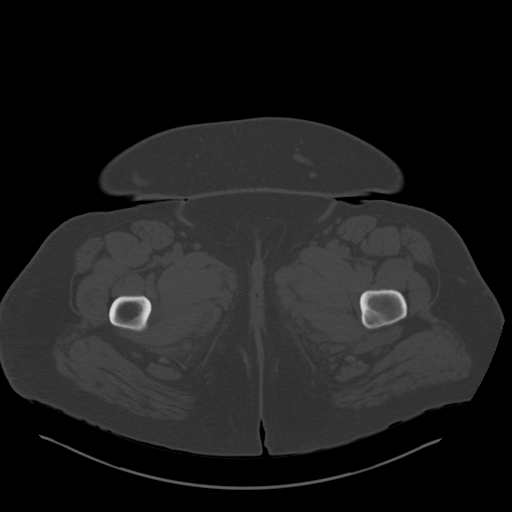
[im 10/91  soft-tissue]
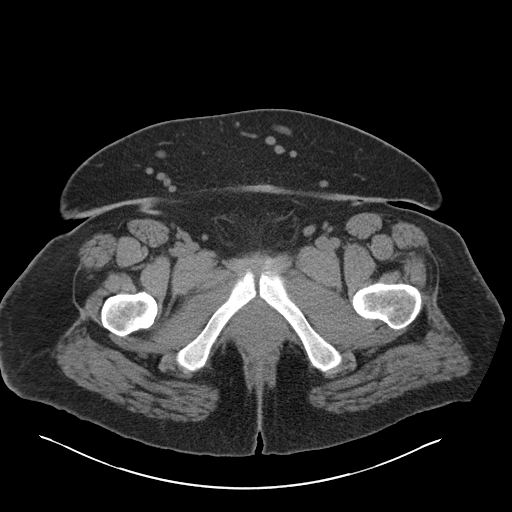
[im 19/91  soft-tissue]
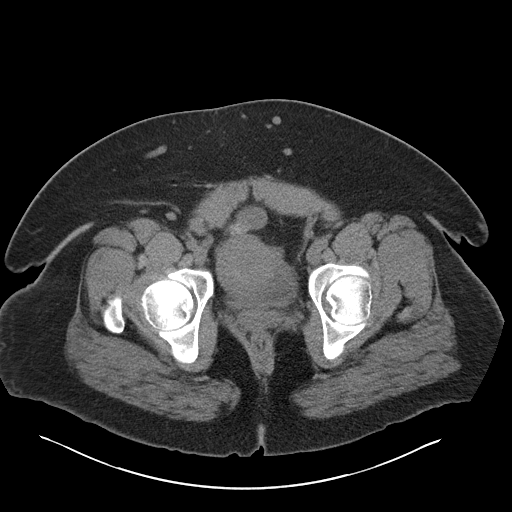
[im 24/91  soft-tissue]
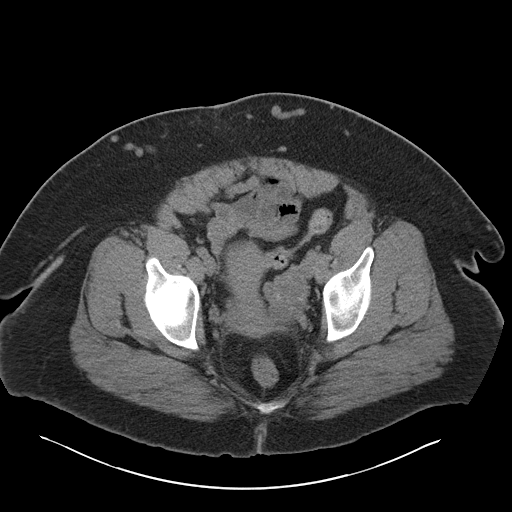
[im 29/91  soft-tissue]
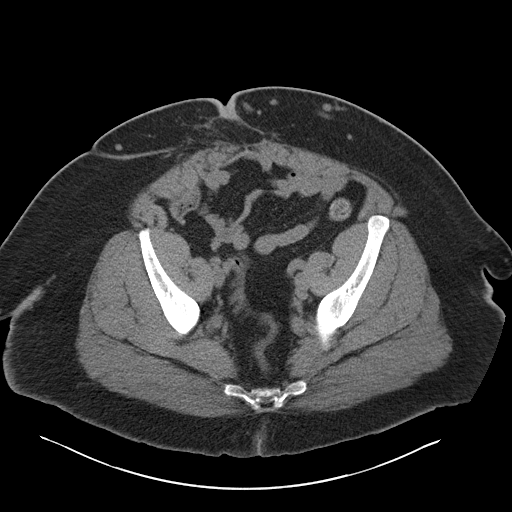
[im 38/91  soft-tissue]
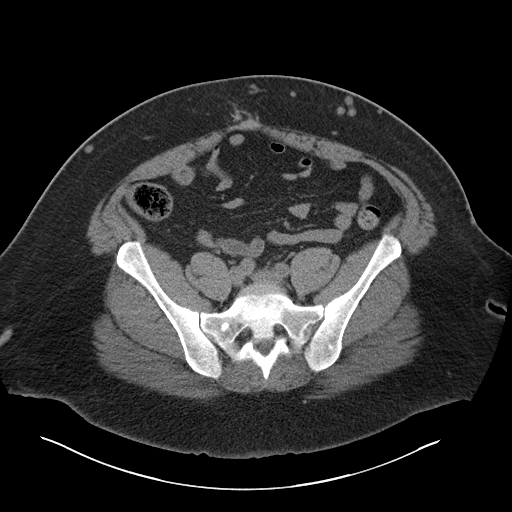
[im 43/91  soft-tissue]
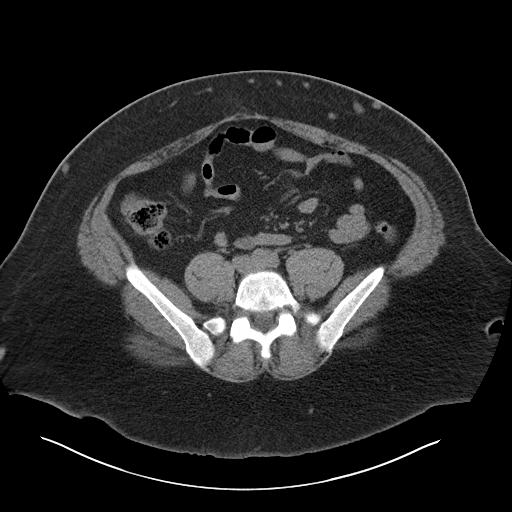
[im 48/91  soft-tissue]
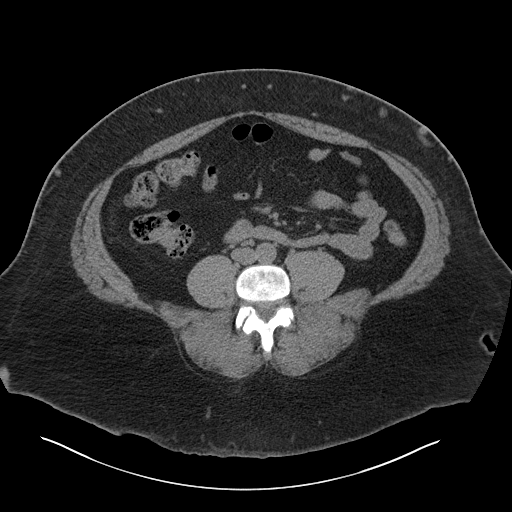
[im 53/91  soft-tissue]
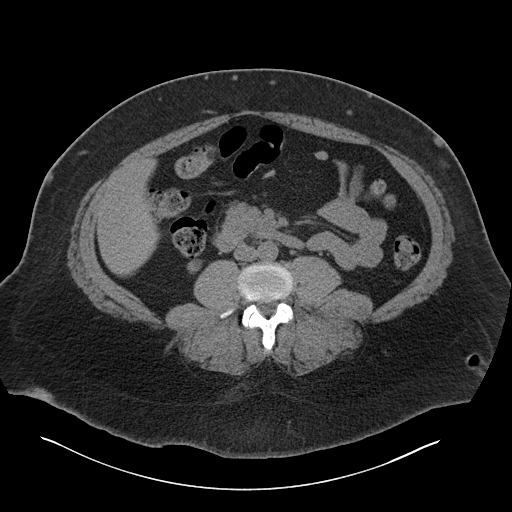
[im 53/91  bone]
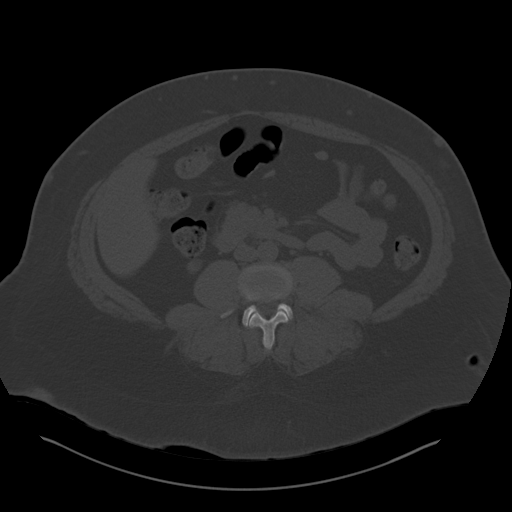
[im 62/91  soft-tissue]
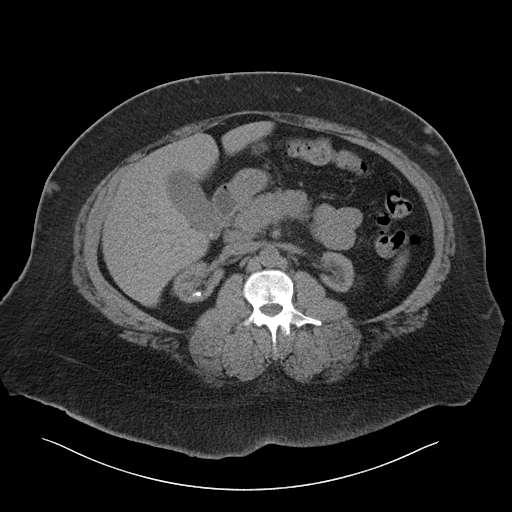
[im 67/91  soft-tissue]
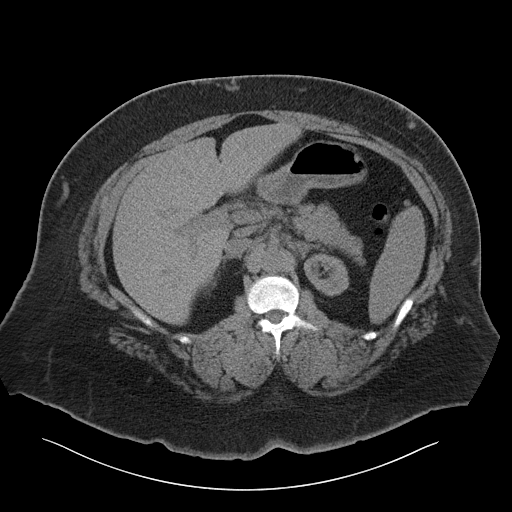
[im 72/91  soft-tissue]
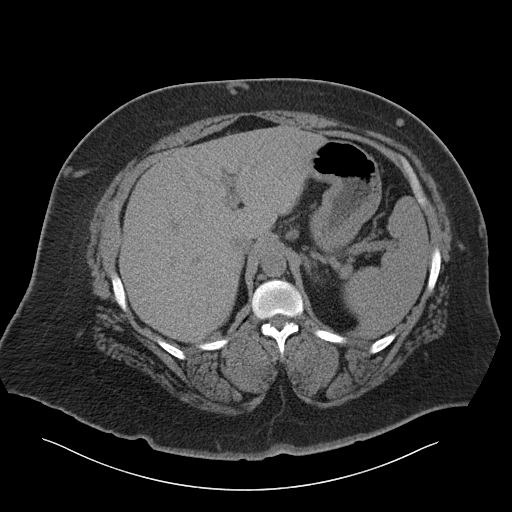
[im 81/91  soft-tissue]
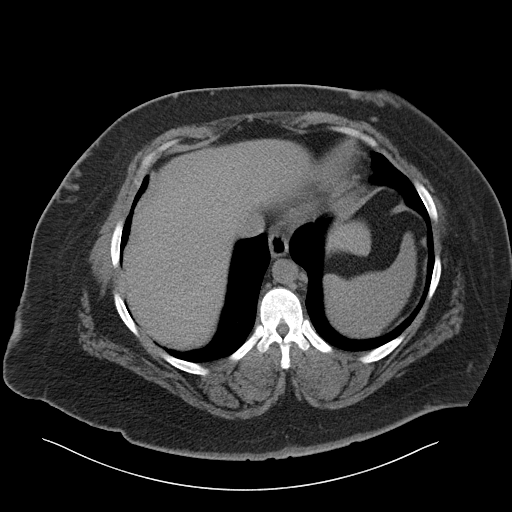
[im 86/91  soft-tissue]
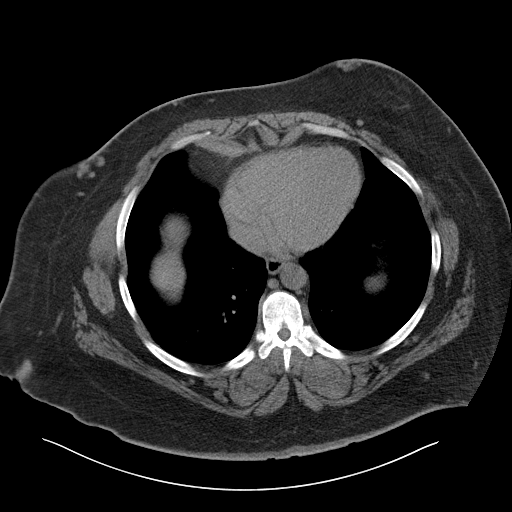

[Series 5: coronal st · coronal · 0.83mm/px · 3 of 122 slices shown]
[im 41/122  soft-tissue]
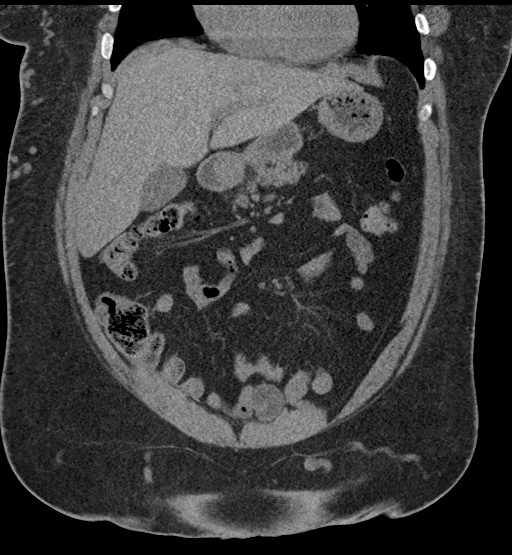
[im 54/122  soft-tissue]
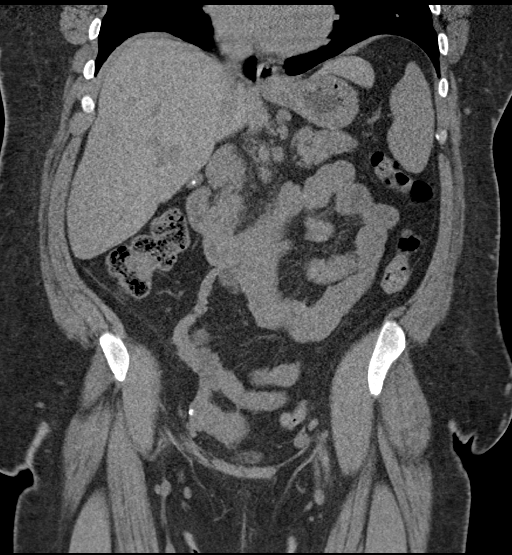
[im 68/122  soft-tissue]
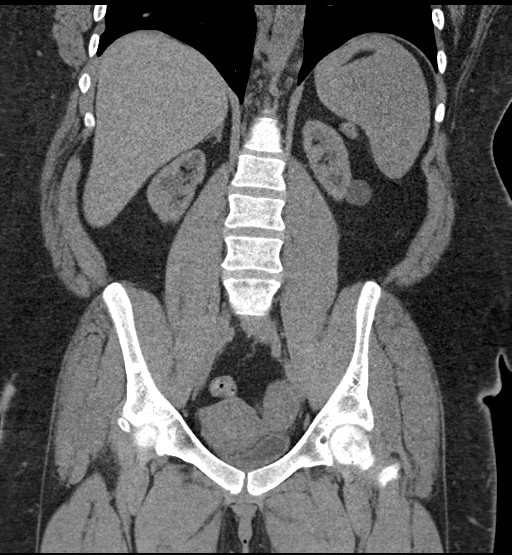

[17 of 46 positions shown; findings below may reference images not displayed]

FINDINGS: Lower chest: No acute abnormality.

Hepatobiliary: Mild cholelithiasis. No gallbladder wall thickening
or adjacent fat stranding noted. The liver is normal in appearance.

Pancreas: Unremarkable. No pancreatic ductal dilatation or
surrounding inflammatory changes.

Spleen: Normal in size without focal abnormality.

Adrenals/Urinary Tract: The adrenal glands are normal. Atrophic
kidneys consistent was renal disease. Bilateral renal cysts noted.
Nonobstructive stones in the kidneys. No hydronephrosis or
perinephric stranding. The ureters and bladder are unremarkable.

Stomach/Bowel: The stomach and small bowel are normal. The colon and
appendix are unremarkable.

Vascular/Lymphatic: The abdominal aorta is normal in appearance. No
adenopathy. Prominent vessels are seen in the abdominal wall of no
acute significance.

Reproductive: Uterus is normal in appearance. There is a dominant
follicle in the right ovary measuring 3 cm. Multiple small follicles
in the left ovary.

Other: No free air or free fluid.

Musculoskeletal: AVN in the right femoral head.
IMPRESSION: 1. Mild cholelithiasis.
2. Atrophic kidneys consistent with renal disease. Renal cysts.
Nonobstructive stones in the kidneys.
3. Dominant 3 cm follicle in the right ovary. Multiple small
follicles in the left ovary.
4. AVN in the right femoral head.

## 2019-07-13 ENCOUNTER — Other Ambulatory Visit: Payer: Self-pay

## 2019-07-13 ENCOUNTER — Encounter: Payer: Self-pay | Admitting: Family Medicine

## 2019-07-13 ENCOUNTER — Ambulatory Visit (INDEPENDENT_AMBULATORY_CARE_PROVIDER_SITE_OTHER): Payer: Medicare Other | Admitting: Family Medicine

## 2019-07-13 VITALS — BP 140/82 | HR 94 | Temp 97.8°F | Ht 68.0 in | Wt 297.0 lb

## 2019-07-13 DIAGNOSIS — Z789 Other specified health status: Secondary | ICD-10-CM

## 2019-07-13 DIAGNOSIS — N186 End stage renal disease: Secondary | ICD-10-CM

## 2019-07-13 DIAGNOSIS — Z862 Personal history of diseases of the blood and blood-forming organs and certain disorders involving the immune mechanism: Secondary | ICD-10-CM

## 2019-07-13 DIAGNOSIS — Z7689 Persons encountering health services in other specified circumstances: Secondary | ICD-10-CM

## 2019-07-13 DIAGNOSIS — IMO0001 Reserved for inherently not codable concepts without codable children: Secondary | ICD-10-CM | POA: Insufficient documentation

## 2019-07-13 DIAGNOSIS — M329 Systemic lupus erythematosus, unspecified: Secondary | ICD-10-CM

## 2019-07-13 DIAGNOSIS — Z8659 Personal history of other mental and behavioral disorders: Secondary | ICD-10-CM

## 2019-07-13 DIAGNOSIS — Z6841 Body Mass Index (BMI) 40.0 and over, adult: Secondary | ICD-10-CM

## 2019-07-13 DIAGNOSIS — J3089 Other allergic rhinitis: Secondary | ICD-10-CM

## 2019-07-13 NOTE — Progress Notes (Addendum)
Patient presents to clinic today to f/u on chronic conditions and establish care.  Pt is a Jehovah's witness and does not want any blood products.  SUBJECTIVE: PMH: Pt is a 38 yo female with pmh sig for Lupus, ESRD on HD (MWF), HTN.  Pt previously seen at [facility].  Lupus: -dx'd in 2007  -had hematuria and blood in stools at the time -has a 1st cousin that has lupus -followed by [facility]  ESRD: -on HD MWF at [facility] on [address]. -Nephrologist Dr. Jeneen Rinks [place] -has 2 AV fistulas in b/l UEs  Former smoker: -smoked cigarettes -started at age 61. -quit in Jan 2020  DM, HTN, HLD: -occurred after prednisone use  H/o depression: -was on sertraline in the past -denies current issues.  states mood is good.  Seasonal allergies: -notes allergy to trees, pollen, dust, mold -zyrtec daily  Allergies: Doxycycline-pruritis Erythromycin-itching, "body feels like it is on fire" IV Vancomycin-SOB, loss of bowel and bladder, "closed veins" Bendadryl-hives Plastic or silk tape-skin irritation  Social Hx: Pt is single.  She is currently on disability due to ESRD and lupus.  Pt has a BA degree.  Pt denies EtOH, tobacco, or drug use.  Health Maintenance: Dental -- Vision -- [employer] Immunizations -- TB test 2019 Colonoscopy --2009 Mammogram --2019 LMP 07/03/2019 PAP -- 2019  Family Hx: Mom- alive, DM, HTN Dad-desc, Depression, early death Sister-Sherrima, alive Daughter- alive, learning disability, intellectual disability MGM-desc, Colon cancer MGF-desc, prostate and lung cancer, tobacco use, COPD, MI, HTN PGM-desc, HLD, HTN   Past Medical History:  Diagnosis Date  . Anemia   . Depression   . DM (diabetes mellitus) ([place])   . DUB (dysfunctional uterine bleeding) 06/24/2016  . End stage renal disease ([place])   . HLD (hyperlipidemia)   . HTN (hypertension)   . Lupus ([place])   . Ovarian cyst 09/01/2016  . Renal disorder   . Sleep  apnea     Past Surgical History:  Procedure Laterality Date  . AV FISTULA PLACEMENT  2016 right, 2009 left   left and right are both working   . CESAREAN SECTION    . OTHER SURGICAL HISTORY Left    AV fistula stents, arm  . TUBAL LIGATION  2010    Current Outpatient Medications on File Prior to Visit  Medication Sig Dispense Refill  . cetirizine (ZYRTEC) 10 MG tablet Take 10 mg by mouth daily.    . cinacalcet (SENSIPAR) 60 MG tablet Take 60 mg by mouth daily with supper.     . multivitamin (RENA-VIT) TABS tablet Take 1 tablet by mouth daily.  3  . pantoprazole (PROTONIX) 40 MG tablet Take 40 mg by mouth daily.  6   No current facility-administered medications on file prior to visit.    Allergies  Allergen Reactions  . Azithromycin Hives  . Benadryl [Diphenhydramine Hcl] Hives  . Doxycycline Hyclate   . Vancomycin Swelling  . Adhesive [Tape] Rash    Family History  Problem Relation Age of Onset  . Hypertension Mother   . Diabetes Mother   . Autism Daughter   . Thyroid disease Sister   . Colon cancer Maternal Grandmother   . Prostate cancer Maternal Grandfather   . Breast cancer Maternal Aunt   . Diabetes Other        both sides of the fam  . Hypertension Other        both sides of the fam    Social  History   Socioeconomic History  . Marital status: Single    Spouse name: Not on file  . Number of children: 1  . Years of education: Not on file  . Highest education level: Not on file  Occupational History  . Not on file  Tobacco Use  . Smoking status: Former Smoker    Types: Cigars    Start date: 2018    Quit date: 07/2018    Years since quitting: 0.9  . Smokeless tobacco: Never Used  . Tobacco comment: 1 black and mild cigar every day   Substance and Sexual Activity  . Alcohol use: No  . Drug use: No  . Sexual activity: Yes    Partners: Male    Birth control/protection: None  Other Topics Concern  . Not on file  Social History Narrative   **  Merged History Encounter **       Level of education: college    Employment: unemployed    Transportation: car    Exercise: no   Housing situation: apt   Relationships (safe): yes   Contact for message (voicemail): [phone]   Social Determinants of Health   Financial Resource Strain:   . Difficulty of Paying Living Expenses: Not on file  Food Insecurity:   . Worried About Charity fundraiser in the Last Year: Not on file  . Ran Out of Food in the Last Year: Not on file  Transportation Needs:   . Lack of Transportation (Medical): Not on file  . Lack of Transportation (Non-Medical): Not on file  Physical Activity:   . Days of Exercise per Week: Not on file  . Minutes of Exercise per Session: Not on file  Stress:   . Feeling of Stress : Not on file  Social Connections:   . Frequency of Communication with Friends and Family: Not on file  . Frequency of Social Gatherings with Friends and Family: Not on file  . Attends Religious Services: Not on file  . Active Member of Clubs or Organizations: Not on file  . Attends Archivist Meetings: Not on file  . Marital Status: Not on file  Intimate Partner Violence:   . Fear of Current or Ex-Partner: Not on file  . Emotionally Abused: Not on file  . Physically Abused: Not on file  . Sexually Abused: Not on file    ROS General: Denies fever, chills, night sweats, changes in weight, changes in appetite HEENT: Denies headaches, ear pain, changes in vision, rhinorrhea, sore throat CV: Denies CP, palpitations, SOB, orthopnea Pulm: Denies SOB, cough, wheezing GI: Denies abdominal pain, nausea, vomiting, diarrhea, constipation GU: Denies dysuria, hematuria, frequency, vaginal discharge Msk: Denies muscle cramps, joint pains Neuro: Denies weakness, numbness, tingling Skin: Denies rashes, bruising Psych: Denies depression, anxiety, hallucinations   BP 140/82   Pulse 94   Temp 97.8 F (36.6 C) (Temporal)   Ht 5\' 8"  (1.727  m)   Wt 297 lb (134.7 kg)   SpO2 98%   BMI 45.16 kg/m   Physical Exam Gen. Pleasant, well developed, well-nourished, in NAD HEENT - [place]/AT, PERRL, EOMI, no scleral icterus, no nasal drainage, pharynx without erythema or exudate. Lungs: no use of accessory muscles, CTAB, no wheezes, rales or rhonchi Cardiovascular: RRR, no peripheral edema.  Audible bruit of b/l UE AV fistulas. Abdomen: BS present, soft, nontender,nondistended. Musculoskeletal: No deformities, moves all four extremities, no cyanosis or clubbing, normal tone Neuro:  A&Ox3, CN II-XII intact, normal gait Skin:  Warm,  dry, intact, no lesions   No results found for this or any previous visit (from the past 2160 hour(s)).  Assessment/Plan: ESRD (end stage renal disease) ([place]) -on HD MWF on [address] -continue renal diet -continue current meds including sensipar 60 mg daily, nephrovite 0.8 mg daily, velphror chew 500 mg take 3 with meals and 2 with snacks -continue f/u with nephrology, Dr. Jimmy Footman  Lupus [facility]) -stable -continue f/u with [facility] Rheumatology  Class 3 severe obesity with serious comorbidity and body mass index (BMI) of 45.0 to 49.9 in adult, unspecified obesity type ([place]) -discussed lifestyle modifications including portion control -consider chair exercises  History of anemia  Encounter to establish care -We reviewed the PMH, PSH, FH, SH, Meds and Allergies. -We provided refills for any medications we will prescribe as needed. -We addressed current concerns per orders and patient instructions. -We have asked for records for pertinent exams, studies, vaccines and notes from previous providers. -We have advised patient to follow up per instructions below.  Patient is Jehovah's Witness -notes h/o prior transfusion in 2009 or 10 2/2 AUB -does not wish to have any blood products  Environmental and seasonal allergies -continue Zyrtec 10 mg daily  History of depression -GAD 7 score 4 -PHQ 9  score 3 -previously on sertraline. Stable off meds -continue to monitor  F/u in 3 months, sooner if needed.  Grier Mitts, MD   This note is not being shared with the patient for the following reason: To prevent harm (release of this note would result in harm to the life or physical safety of the patient or another).

## 2019-07-13 NOTE — Patient Instructions (Signed)
Eating Plan for Dialysis Dialysis is a procedure that is done when the kidneys have stopped working properly (kidney failure). During dialysis, wastes, salt, and extra water are removed from the blood, and the levels of certain minerals in the blood are maintained. If you are undergoing dialysis, it is important to pay careful attention to what you eat. Between dialysis sessions, certain nutrients and wastes can build up in your blood and cause you to get sick. Even though nutrients, such as carbohydrates, fats, vitamins, and minerals, are an important part of a healthy diet, you may need to limit your intake of certain nutrients when you are on dialysis. When you are on dialysis, it is important that you work with your health care provider or a diet and nutrition specialist (dietitian) to help you make an eating plan that meets your specific needs. What are tips for following this plan? Reading food labels  Check food labels for the amount of: ? Potassium. This is found in milk, fruits, and vegetables. ? Phosphorus. This is found in milk, cheese, beans, nuts, and carbonated beverages. ? Sodium. Sodium content is high in processed and cured meats, ready-made frozen meals, canned vegetables, and salty snack foods.  Try to find foods that are low in potassium, phosphorus, and sodium.  Look for foods that are labeled "sodium free," "reduced sodium," or "low sodium." Shopping  Do not buy whole-grain and high-fiber foods because they contain high amounts of phosphorus.  Do not buy or use salt substitutes because they contain potassium.  Do not buy processed foods. These are usually high in sodium and phosphorus. Cooking  Drain all fluid from cooked vegetables and canned fruits before eating them.  Cut potatoes into small pieces and boil them in unsalted water before you eat them. This can help to remove some potassium from the potato.  To add flavor, try using herbs and spices that do not  contain sodium. Meal planning      Most people on dialysis should try to eat: ? 6-11 servings of grains each day. One serving is equal to 1 slice of bread or  cup of cooked rice or pasta. ? 2-3 servings of low-potassium vegetables each day. One serving is equal to  cup. ? 2-3 servings of low-potassium fruits each day. One serving is equal to  cup. ? High-quality proteins, such as meat, poultry, fish, and eggs. Talk with your health care provider or dietitian about the right amount and type of protein to include in your eating plan. ?  cup of dairy each day.  Avoid foods that are high in sodium and phosphorus. Foods that generally are very salty will be high in sodium, and foods that are high in fiber or starch may be high in phosphorus. General information  Follow your health care provider's instructions to restrict your fluid intake. You may be told to: ? Write down what you drink and any foods you eat that are made mostly from water, such as gelatin and soups. ? Drink from small cups to help control how much you drink.  Take vitamin and mineral supplements only as told by your health care provider.  Take over-the-counter and prescription medicines only as told by your health care provider. ? Your health care provider may recommend an over-the-counter medicine that binds phosphorus, such as an antacid medicine that contains calcium carbonate. What foods can I eat? Fruits Apples. Fresh or frozen berries. Fresh or canned pears, peaches, and pineapple. Grapes. Plums. Vegetables Fresh or  frozen broccoli, carrots, and green beans. Cabbage. Cauliflower. Celery. Cucumbers. Eggplant. Radishes. Zucchini. Grains White bread. White rice. Cooked cereal. Unsalted popcorn. Tortillas. Pasta. Meats and other proteins Fresh or frozen beef, pork, chicken, and fish. Eggs. Dairy Cream cheese. Heavy cream. Ricotta cheese. Beverages Apple cider. Cranberry juice. Grape juice. Lemonade. Black  coffee. Rice milk (that is not enriched or fortified). Condiments Herbs. Spices. Jam and jelly. Honey. Sweets and desserts Sherbet. Cakes. Cookies. Fats and oils Olive oil, canola oil, and safflower oil. Other Non-dairy creamer. Non-dairy whipped topping. Homemade broth without salt. The items listed above may not be a complete list of foods and beverages you can eat. Contact your dietitian for more options. What foods are not recommended? Fruits Star fruit. Bananas. Oranges. Kiwi. Nectarines. Prunes. Melon. Dried fruit. Avocado. Vegetables Potatoes. Beets. Tomatoes. Winter squash and pumpkin. Asparagus. Spinach. Parsnips. Grains Whole-grain bread. Whole-grain pasta. High-fiber cereal. Meats and other proteins Canned, smoked, and cured meats. Soil scientist. Sardines. Nuts and seeds. Peanut butter. Beans and legumes. Dairy Milk. Buttermilk. Yogurt. Cheese and cottage cheese. Processed cheese spreads. Beverages Orange juice. Prune juice. Carbonated soft drinks. Condiments Salt. Salt substitutes. Soy sauce. Sweets and desserts Ice cream. Chocolate. Candied nuts. Fats and oils Butter. Margarine. Other Ready-made frozen meals. Canned soups. The items listed above may not be a complete list of foods and beverages you should avoid. Contact your dietitian for more information. Summary  Dialysis is a procedure that is done when the kidneys have stopped working properly (kidney failure).  If you are undergoing dialysis, it is important to pay careful attention to what you eat. Between dialysis sessions, certain nutrients and wastes can build up in your blood and cause you to get sick.  Your dietitian will help you design an eating plan that is specific to your needs.  Avoid foods that are high in sodium and phosphorus. Restrict fluids as told by your health care provider or dietitian. This information is not intended to replace advice given to you by your health care provider.  Make sure you discuss any questions you have with your health care provider. Document Released: 04/09/2004 Document Revised: 09/29/2017 Document Reviewed: 07/14/2017 Elsevier Patient Education  2020 [place].  Systemic Lupus Erythematosus, Adult Systemic lupus erythematosus (SLE) is a long-term (chronic) disease that can affect many parts of the body. SLE is an autoimmune disease. With this type of disease, the body's defense system (immune system) mistakenly attacks healthy tissues. This can cause damage to the skin, joints, blood vessels, brain, kidneys, lungs, heart, and other internal organs. It causes pain, irritation, and inflammation. What are the causes? The cause of this condition is not known. What increases the risk? The following factors may make you more likely to develop this condition:  Being female.  Being of Asian, Hispanic, or African-American descent.  Having a family history of the condition.  Being exposed to tobacco smoke or smoking cigarettes.  Having an infection with a virus, such as Epstein-Barr virus.  Having a history of exposure to silica dust, metals, chemicals, mold or mildew, or insecticides.  Using oral contraceptives or hormone replacement therapy. What are the signs or symptoms? This condition can affect almost any organ or system in the body. Symptoms of the condition depend on which organ or system is affected. The most common symptoms include:  Fever.  Fatigue.  Weight loss.  Muscle aches.  Joint pain.  Skin rashes, especially over the nose and cheeks (butterfly rash) and after sun exposure. Symptoms  can come and go. A period of time when symptoms get worse or come back is called a flare. A period of time with no symptoms is called a remission. How is this diagnosed? This condition is diagnosed based on:  Your symptoms.  Your medical history.  A physical exam. You may also have tests, including:  Blood tests.  Urine  tests.  A chest X-ray. You may be referred to an autoimmune disease specialist (rheumatologist). How is this treated? There is no cure for this condition, but treatment can help to control symptoms, prevent flares (keep symptoms in remission), and prevent damage to the heart, lungs, kidneys, and other organs. Treatment will depend on what symptoms you are having and what organs or systems are affected. Treatment may involve taking a combination of medicines over time. Common medicines used to treat this condition include:  Antimalarial medicines to control symptoms, prevent flares, and protect against organ damage.  Corticosteroids and NSAIDs to reduce inflammation.  Medicines to weaken your immune system (immunosuppressants).  Biologic response modifiers to reduce inflammation and damage. Follow these instructions at home: Eating and drinking  Eat a heart-healthy diet. This may include: ? Eating high-fiber foods, such as fresh fruits and vegetables, whole grains, and beans. ? Eating heart-healthy fats (omega-3 fats), such as fish, flaxseed, and flaxseed oil. ? Limiting foods that are high in saturated fat and cholesterol, such as processed and fried foods, fatty meat, and full-fat dairy. ? Limiting how much salt (sodium) you eat.  Include calcium and vitamin D in your diet. Good sources of calcium and vitamin D include: ? Low-fat dairy products such as milk, yogurt, and cheese. ? Certain fish, such as fresh or canned salmon, tuna, and sardines. ? Products that have calcium and vitamin D added to them (fortified products), such as fortified cereals or juice. Medicines  Take over-the-counter and prescription medicines only as told by your health care provider.  Do not take any medicines that contain estrogen without first checking with your health care provider. Estrogen can trigger flares and may increase your risk for blood clots. Lifestyle      Stay active, as directed by your  health care provider.  Do not use any products that contain nicotine or tobacco, such as cigarettes and e-cigarettes. If you need help quitting, ask your health care provider.  Protect your skin from the sun by applying sunblock and wearing protective hats and clothing.  Learn as much as you can about your condition and have a good support system in place. Support may come from family, friends, or a lupus support group. General instructions  Work closely with all of your health care providers to manage your condition.  Stay up to date on all vaccines as directed by your health care provider.  Keep all follow-up visits as told by your health care provider. This is important. Contact a health care provider if:  You have a fever.  Your symptoms flare.  You develop new symptoms.  You have bloody, foamy, or coffee-colored urine.  There are changes in your urination. For example, you urinate more often at night.  You think that you may be depressed or have anxiety.  You become pregnant or plan to become pregnant. Pregnancy in women with this condition is considered high risk. Get help right away if:  You have chest pain.  You have trouble breathing.  You have a seizure.  You suddenly get a very bad headache.  You suddenly develop facial or body  weakness.  You cannot speak.  You cannot understand speech. These symptoms may represent a serious problem that is an emergency. Do not wait to see if the symptoms will go away. Get medical help right away. Call your local emergency services (911 in the [place] Do not drive yourself to the hospital. Summary  Systemic lupus erythematosus (SLE) is a long-term disease that can affect many parts of the body.  SLE is an autoimmune disease. That means your body's defense system (immune system) mistakenly attacks healthy tissues.  There is no cure for this condition, but treatment can help to control symptoms, prevent flares, and prevent  damage to your organs. Treatment may involve taking a combination of medicines over time. This information is not intended to replace advice given to you by your health care provider. Make sure you discuss any questions you have with your health care provider. Document Released: 07/03/2002 Document Revised: 08/26/2017 Document Reviewed: 08/20/2017 Elsevier Patient Education  2020 [employer].

## 2019-07-17 DIAGNOSIS — J3089 Other allergic rhinitis: Secondary | ICD-10-CM | POA: Insufficient documentation

## 2019-07-17 DIAGNOSIS — Z8659 Personal history of other mental and behavioral disorders: Secondary | ICD-10-CM | POA: Insufficient documentation

## 2019-10-11 ENCOUNTER — Other Ambulatory Visit: Payer: Self-pay

## 2019-10-12 ENCOUNTER — Ambulatory Visit (INDEPENDENT_AMBULATORY_CARE_PROVIDER_SITE_OTHER): Payer: Medicare Other | Admitting: Family Medicine

## 2019-10-12 ENCOUNTER — Encounter: Payer: Self-pay | Admitting: Family Medicine

## 2019-10-12 VITALS — BP 118/76 | HR 96 | Temp 96.7°F | Wt 301.0 lb

## 2019-10-12 DIAGNOSIS — T82848A Pain from vascular prosthetic devices, implants and grafts, initial encounter: Secondary | ICD-10-CM

## 2019-10-12 DIAGNOSIS — I953 Hypotension of hemodialysis: Secondary | ICD-10-CM

## 2019-10-12 DIAGNOSIS — K5909 Other constipation: Secondary | ICD-10-CM | POA: Diagnosis not present

## 2019-10-12 DIAGNOSIS — L308 Other specified dermatitis: Secondary | ICD-10-CM

## 2019-10-12 DIAGNOSIS — N186 End stage renal disease: Secondary | ICD-10-CM

## 2019-10-12 DIAGNOSIS — R200 Anesthesia of skin: Secondary | ICD-10-CM

## 2019-10-12 DIAGNOSIS — Z992 Dependence on renal dialysis: Secondary | ICD-10-CM

## 2019-10-12 DIAGNOSIS — B360 Pityriasis versicolor: Secondary | ICD-10-CM

## 2019-10-12 NOTE — Patient Instructions (Addendum)
You can use [place] Blue for the areas on chest and back.  Apply 1 layer of the shampoo to the skin and leave on for 10 minutes.  Then rinse.  You can do this for the next 7 days.   Eczema Eczema is a broad term for a group of skin conditions that cause skin to become rough and inflamed. Each type of eczema has different triggers, symptoms, and treatments. Eczema of any type is usually itchy and symptoms range from mild to severe. Eczema and its symptoms are not spread from person to person (are not contagious). It can appear on different parts of the body at different times. Your eczema may not look the same as someone else's eczema. What are the types of eczema? Atopic dermatitis This is a long-term (chronic) skin disease that keeps coming back (recurring). Usual symptoms are dry skin and small, solid pimples that may swell and leak fluid (weep). Contact dermatitis  This happens when something irritates the skin and causes a rash. The irritation can come from substances that you are allergic to (allergens), such as poison ivy, chemicals, or medicines that were applied to your skin. Dyshidrotic eczema This is a form of eczema on the hands and feet. It shows up as very itchy, fluid-filled blisters. It can affect people of any age, but is more common before age 75. Hand eczema  This causes very itchy areas of skin on the palms and sides of the hands and fingers. This type of eczema is common in industrial jobs where you may be exposed to many different types of irritants. Lichen simplex chronicus This type of eczema occurs when a person constantly scratches one area of the body. Repeated scratching of the area leads to thickened skin (lichenification). Lichen simplex chronicus can occur along with other types of eczema. It is more common in adults, but may be seen in children as well. Nummular eczema This is a common type of eczema. It has no known cause. It typically causes a red, circular, crusty  lesion (plaque) that may be itchy. Scratching may become a habit and can cause bleeding. Nummular eczema occurs most often in people of middle-age or older. It most often affects the hands. Seborrheic dermatitis This is a common skin disease that mainly affects the scalp. It may also affect any oily areas of the body, such as the face, sides of nose, eyebrows, ears, eyelids, and chest. It is marked by small scaling and redness of the skin (erythema). This can affect people of all ages. In infants, this condition is known as Chartered certified accountant." Stasis dermatitis This is a common skin disease that usually appears on the legs and feet. It most often occurs in people who have a condition that prevents blood from being pumped through the veins in the legs (chronic venous insufficiency). Stasis dermatitis is a chronic condition that needs long-term management. How is eczema diagnosed? Your health care provider will examine your skin and review your medical history. He or she may also give you skin patch tests. These tests involve taking patches that contain possible allergens and placing them on your back. He or she will then check in a few days to see if an allergic reaction occurred. What are the common treatments? Treatment for eczema is based on the type of eczema you have. Hydrocortisone steroid medicine can relieve itching quickly and help reduce inflammation. This medicine may be prescribed or obtained over-the-counter, depending on the strength of the medicine that is needed.  Follow these instructions at home:  Take over-the-counter and prescription medicines only as told by your health care provider.  Use creams or ointments to moisturize your skin. Do not use lotions.  Learn what triggers or irritates your symptoms. Avoid these things.  Treat symptom flare-ups quickly.  Do not itch your skin. This can make your rash worse.  Keep all follow-up visits as told by your health care provider. This is  important. Where to find more information  The American Academy of Dermatology: http://jones-macias.info/  The National Eczema Association: www.nationaleczema.org Contact a health care provider if:  You have serious itching, even with treatment.  You regularly scratch your skin until it bleeds.  Your rash looks different than usual.  Your skin is painful, swollen, or more red than usual.  You have a fever. Summary  There are eight general types of eczema. Each type has different triggers.  Eczema of any type causes itching that may range from mild to severe.  Treatment varies based on the type of eczema you have. Hydrocortisone steroid medicine can help with itching and inflammation.  Protecting your skin is the best way to prevent eczema. Use moisturizers and lotions. Avoid triggers and irritants, and treat flare-ups quickly. This information is not intended to replace advice given to you by your health care provider. Make sure you discuss any questions you have with your health care provider. Document Revised: 06/25/2017 Document Reviewed: 11/26/2016 Elsevier Patient Education  2020 [place] versicolor is a common fungal infection of the skin. It causes a rash that appears as light or dark patches on the skin. The rash most often occurs on the chest, back, neck, or upper arms. This condition is more common during warm weather. Other than affecting how your skin looks, tinea versicolor usually does not cause other problems. In most cases, the infection goes away in a few weeks with treatment. It may take a few months for the patches on your skin to return to your usual skin color. What are the causes? This condition occurs when a type of fungus that is normally present on the skin starts to overgrow. This fungus is a kind of yeast. The exact cause of the overgrowth is not known. This condition cannot be passed from one person to another (it is not  contagious). What increases the risk? This condition is more likely to develop when certain factors are present, such as:  Heat and humidity.  Sweating too much.  Hormone changes.  Oily skin.  A weak disease-fighting system (immunesystem). What are the signs or symptoms? Symptoms of this condition include:  A rash of light or dark patches on your skin. The rash may have: ? Patches of tan or pink spots (on light skin). ? Patches of white or brown spots (on dark skin). ? Patches of skin that do not tan. ? Well-marked edges. ? Scales on the discolored areas.  Mild itching. How is this diagnosed? A health care provider can usually diagnose this condition by looking at your skin. During the exam, he or she may use ultraviolet (UV) light to see how much of your skin has been affected. In some cases, a skin sample may be taken by scraping the rash. This sample will be viewed under a microscope to check for yeast overgrowth. How is this treated? Treatment for this condition may include:  Dandruff shampoo that is applied to the affected skin during showers or bathing.  Over-the-counter medicated skin cream,  lotion, or soaps.  Prescription antifungal medicine in the form of skin cream or pills.  Medicine to help reduce itching. Follow these instructions at home:  Take over-the-counter and prescription medicines only as told by your health care provider.  Apply dandruff shampoo to the affected area if your health care provider told you to do that. You may be instructed to scrub the affected skin for several minutes each day.  Do not scratch the affected area of skin.  Avoid hot and humid conditions.  Do not use tanning booths.  Try to avoid sweating a lot. Contact a health care provider if:  Your symptoms get worse.  You have a fever.  You have redness, swelling, or pain at the site of your rash.  You have fluid or blood coming from your rash.  Your rash feels warm to  the touch.  You have pus or a bad smell coming from your rash.  Your rash returns (recurs) after treatment. Summary  Tinea versicolor is a common fungal infection of the skin. It causes a rash that appears as light or dark patches on the skin.  The rash most often occurs on the chest, back, neck, or upper arms.  A health care provider can usually diagnose this condition by looking at your skin.  Treatment may include applying shampoo to the skin and taking or applying medicines. This information is not intended to replace advice given to you by your health care provider. Make sure you discuss any questions you have with your health care provider. Document Revised: 06/25/2017 Document Reviewed: 03/16/2017 Elsevier Patient Education  [place].  Constipation, Adult Constipation is when a person has fewer bowel movements in a week than normal, has difficulty having a bowel movement, or has stools that are dry, hard, or larger than normal. Constipation may be caused by an underlying condition. It may become worse with age if a person takes certain medicines and does not take in enough fluids. Follow these instructions at home: Eating and drinking   Eat foods that have a lot of fiber, such as fresh fruits and vegetables, whole grains, and beans.  Limit foods that are high in fat, low in fiber, or overly processed, such as french fries, hamburgers, cookies, candies, and soda.  Drink enough fluid to keep your urine clear or pale yellow. General instructions  Exercise regularly or as told by your health care provider.  Go to the restroom when you have the urge to go. Do not hold it in.  Take over-the-counter and prescription medicines only as told by your health care provider. These include any fiber supplements.  Practice pelvic floor retraining exercises, such as deep breathing while relaxing the lower abdomen and pelvic floor relaxation during bowel movements.  Watch your  condition for any changes.  Keep all follow-up visits as told by your health care provider. This is important. Contact a health care provider if:  You have pain that gets worse.  You have a fever.  You do not have a bowel movement after 4 days.  You vomit.  You are not hungry.  You lose weight.  You are bleeding from the anus.  You have thin, pencil-like stools. Get help right away if:  You have a fever and your symptoms suddenly get worse.  You leak stool or have blood in your stool.  Your abdomen is bloated.  You have severe pain in your abdomen.  You feel dizzy or you faint. This information is not  intended to replace advice given to you by your health care provider. Make sure you discuss any questions you have with your health care provider. Document Revised: 06/25/2017 Document Reviewed: 01/01/2016 Elsevier Patient Education  2020 [employer].

## 2019-10-12 NOTE — Progress Notes (Signed)
Subjective:    Patient ID: Cindy Luna, female    DOB: 11-Sep-1980, 39 y.o.   MRN: [id number]  No chief complaint on file.   HPI Pt is a 39 yo female with pmh sig for OSA, lymphadenopathy of head and neck, ESRD on HD MWF, lupus, obesity, tobacco use, history of depression, allergies.  Pt seen today for follow-up and several concerns.  Pt endorses rash neck, chest, and back times a while.  Pt states lesions at time are dark in color and raised.  Lesions currently flat, at times pruritic.  Pt notes history of constipation.  Currently on Linzess 145 mg as needed.  Typically takes on days she does not have appointments.  MiraLAX and Benefiber have not worked for patient.  Pt is on fluid restrictions 2/2 HD. Pt with pain in RUE fistula.  States the arterial part of her fistual hurts when it is accessed.  Pt started having hypotension, dizziness, headaches, increased arterial pressure during HD.  Fistulogram and ultrasound done at [facility] were negative.  Pt does not feel like her entire arm was ultrasound.  Also having numbness and cold sensation in fingers.  Denies color change or pain fingers.  Past Medical History:  Diagnosis Date  . Anemia   . Depression   . DM (diabetes mellitus) ([place])   . DUB (dysfunctional uterine bleeding) 06/24/2016  . End stage renal disease ([place])   . HLD (hyperlipidemia)   . HTN (hypertension)   . Lupus ([place])   . Ovarian cyst 09/01/2016  . Renal disorder   . Sleep apnea     Allergies  Allergen Reactions  . Azithromycin Hives  . Benadryl [Diphenhydramine Hcl] Hives  . Doxycycline Hyclate   . Vancomycin Swelling  . Adhesive [Tape] Rash    ROS General: Denies fever, chills, night sweats, changes in weight, changes in appetite  + dizziness HEENT: Denies ear pain, changes in vision, rhinorrhea, sore throat + headache CV: Denies CP, palpitations, SOB, orthopnea  + pain in RUE AV fistula Pulm: Denies SOB, cough, wheezing GI: Denies abdominal pain, nausea, vomiting,  diarrhea, constipation GU: Denies dysuria, hematuria, frequency, vaginal discharge Msk: Denies muscle cramps, joint pains Neuro: Denies weakness, tingling  + numbness in fingers Skin: Denies bruising  + rash Psych: Denies depression, anxiety, hallucinations    Objective:    Blood pressure 118/76, pulse 96, temperature (!) 96.7 F (35.9 C), temperature source Temporal, weight (!) 306 lb (138.8 kg), SpO2 98 %.  Gen. Pleasant, well-nourished, in no distress, obese,normal affect   HEENT: [place]/AT, face symmetric, no scleral icterus, PERRLA, EOMI, nares patent without drainage Lungs: no accessory muscle use, CTAB, no wheezes or rales Cardiovascular: RRR, no peripheral edema, AV fistula RUE Musculoskeletal: Neck enlarged, no deformities, no cyanosis or clubbing, normal tone Neuro:  A&Ox3, CN II-XII intact, normal gait Skin:  Warm, dry, intact.  Chest, abdomen, back, neck with circumscribed hyperpigmented macules and several hypopigmented, dry appearing macules.   Wt Readings from Last 3 Encounters:  10/12/19 (!) 306 lb (138.8 kg)  07/13/19 297 lb (134.7 kg)  01/05/19 (!) 314 lb (142.4 kg)    Lab Results  Component Value Date   WBC 10.4 04/29/2018   HGB 12.3 04/29/2018   HCT 36.4 04/29/2018   PLT 273 04/29/2018   GLUCOSE 110 (H) 04/29/2018   CHOL 166 04/18/2018   TRIG 334 (H) 04/18/2018   HDL 38 (L) 04/18/2018   LDLCALC 61 04/18/2018   ALT 9 04/29/2018   AST 10 (L)  04/29/2018   NA 136 04/29/2018   K 3.4 (L) 04/29/2018   CL 98 04/29/2018   CREATININE 6.18 (H) 04/29/2018   BUN 14 04/29/2018   CO2 23 04/29/2018   TSH 2.57 12/06/2015   HGBA1C 5.3 04/18/2018    Assessment/Plan:  Tinea versicolor -Pictures of hyperpigmented lesions when raised reviewed on patient's phone. -Discussed causes -Discussed treatment options.  Pt to use Selsun Blue shampoo -Given handout  Other eczema -Several areas.  Dry and eczematous in nature -Discussed using a good moisturizing  lotion -Given all the precautions and tips -We will continue to monitor  Chronic constipation -Continue Linzess as needed  Numbness of fingers -Discussed possible causes including cold-induced, anemia, effective HD or medications. -Encouraged to have CBC, folate, B12 checked during dialysis  Pain from A/V fistula (HCC) -Fistulogram and ultrasound negative -Follow-up with nephrology and vascular encouraged  Hemodialysis-associated hypotension -Possibly 2/2 dehydration/adjustment needed and dry weight. -Pt advised okay to drink up to fluid restriction -Nephrology to continue to monitor while on HD and make adjustments as needed  ESRD (end stage renal disease) on dialysis ([place]) -Continue Sensipar, Rena-Vite -Avoid nephrotoxic medications -Continue HD MWF -Continue follow-up with nephrology  F/u prn in 1 month  Grier Mitts, MD

## 2019-10-13 ENCOUNTER — Encounter: Payer: Self-pay | Admitting: Family Medicine

## 2019-11-15 ENCOUNTER — Other Ambulatory Visit: Payer: Self-pay

## 2019-11-16 ENCOUNTER — Encounter: Payer: Self-pay | Admitting: Family Medicine

## 2019-11-16 ENCOUNTER — Ambulatory Visit (INDEPENDENT_AMBULATORY_CARE_PROVIDER_SITE_OTHER): Payer: Medicare Other | Admitting: Family Medicine

## 2019-11-16 VITALS — BP 110/68 | HR 88 | Temp 96.5°F | Wt 300.0 lb

## 2019-11-16 DIAGNOSIS — R200 Anesthesia of skin: Secondary | ICD-10-CM

## 2019-11-16 DIAGNOSIS — B360 Pityriasis versicolor: Secondary | ICD-10-CM | POA: Diagnosis not present

## 2019-11-16 DIAGNOSIS — L308 Other specified dermatitis: Secondary | ICD-10-CM | POA: Diagnosis not present

## 2019-11-16 DIAGNOSIS — K5909 Other constipation: Secondary | ICD-10-CM

## 2019-11-16 DIAGNOSIS — L309 Dermatitis, unspecified: Secondary | ICD-10-CM | POA: Insufficient documentation

## 2019-11-16 DIAGNOSIS — Z992 Dependence on renal dialysis: Secondary | ICD-10-CM

## 2019-11-16 DIAGNOSIS — N186 End stage renal disease: Secondary | ICD-10-CM

## 2019-11-16 MED ORDER — KETOCONAZOLE 2 % EX SHAM: 1.0000 "application " | MEDICATED_SHAMPOO | Freq: Every day | CUTANEOUS | 0 refills | Status: DC

## 2019-11-16 NOTE — Patient Instructions (Addendum)
Eczema Eczema is a broad term for a group of skin conditions that cause skin to become rough and inflamed. Each type of eczema has different triggers, symptoms, and treatments. Eczema of any type is usually itchy and symptoms range from mild to severe. Eczema and its symptoms are not spread from person to person (are not contagious). It can appear on different parts of the body at different times. Your eczema may not look the same as someone else's eczema. What are the types of eczema? Atopic dermatitis This is a long-term (chronic) skin disease that keeps coming back (recurring). Usual symptoms are dry skin and small, solid pimples that may swell and leak fluid (weep). Contact dermatitis  This happens when something irritates the skin and causes a rash. The irritation can come from substances that you are allergic to (allergens), such as poison ivy, chemicals, or medicines that were applied to your skin. Dyshidrotic eczema This is a form of eczema on the hands and feet. It shows up as very itchy, fluid-filled blisters. It can affect people of any age, but is more common before age 40. Hand eczema  This causes very itchy areas of skin on the palms and sides of the hands and fingers. This type of eczema is common in industrial jobs where you may be exposed to many different types of irritants. Lichen simplex chronicus This type of eczema occurs when a person constantly scratches one area of the body. Repeated scratching of the area leads to thickened skin (lichenification). Lichen simplex chronicus can occur along with other types of eczema. It is more common in adults, but may be seen in children as well. Nummular eczema This is a common type of eczema. It has no known cause. It typically causes a red, circular, crusty lesion (plaque) that may be itchy. Scratching may become a habit and can cause bleeding. Nummular eczema occurs most often in people of middle-age or older. It most often affects the  hands. Seborrheic dermatitis This is a common skin disease that mainly affects the scalp. It may also affect any oily areas of the body, such as the face, sides of nose, eyebrows, ears, eyelids, and chest. It is marked by small scaling and redness of the skin (erythema). This can affect people of all ages. In infants, this condition is known as "cradle cap." Stasis dermatitis This is a common skin disease that usually appears on the legs and feet. It most often occurs in people who have a condition that prevents blood from being pumped through the veins in the legs (chronic venous insufficiency). Stasis dermatitis is a chronic condition that needs long-term management. How is eczema diagnosed? Your health care provider will examine your skin and review your medical history. He or she may also give you skin patch tests. These tests involve taking patches that contain possible allergens and placing them on your back. He or she will then check in a few days to see if an allergic reaction occurred. What are the common treatments? Treatment for eczema is based on the type of eczema you have. Hydrocortisone steroid medicine can relieve itching quickly and help reduce inflammation. This medicine may be prescribed or obtained over-the-counter, depending on the strength of the medicine that is needed. Follow these instructions at home:  Take over-the-counter and prescription medicines only as told by your health care provider.  Use creams or ointments to moisturize your skin. Do not use lotions.  Learn what triggers or irritates your symptoms. Avoid these things.    Treat symptom flare-ups quickly.  Do not itch your skin. This can make your rash worse.  Keep all follow-up visits as told by your health care provider. This is important. Where to find more information  The American Academy of Dermatology: http://jones-macias.info/  The National Eczema Association: www.nationaleczema.org Contact a health care provider  if:  You have serious itching, even with treatment.  You regularly scratch your skin until it bleeds.  Your rash looks different than usual.  Your skin is painful, swollen, or more red than usual.  You have a fever. Summary  There are eight general types of eczema. Each type has different triggers.  Eczema of any type causes itching that may range from mild to severe.  Treatment varies based on the type of eczema you have. Hydrocortisone steroid medicine can help with itching and inflammation.  Protecting your skin is the best way to prevent eczema. Use moisturizers and lotions. Avoid triggers and irritants, and treat flare-ups quickly. This information is not intended to replace advice given to you by your health care provider. Make sure you discuss any questions you have with your health care provider. Document Revised: 06/25/2017 Document Reviewed: 11/26/2016 Elsevier Patient Education  2020 [place] versicolor is a common fungal infection of the skin. It causes a rash that appears as light or dark patches on the skin. The rash most often occurs on the chest, back, neck, or upper arms. This condition is more common during warm weather. Other than affecting how your skin looks, tinea versicolor usually does not cause other problems. In most cases, the infection goes away in a few weeks with treatment. It may take a few months for the patches on your skin to return to your usual skin color. What are the causes? This condition occurs when a type of fungus that is normally present on the skin starts to overgrow. This fungus is a kind of yeast. The exact cause of the overgrowth is not known. This condition cannot be passed from one person to another (it is not contagious). What increases the risk? This condition is more likely to develop when certain factors are present, such as:  Heat and humidity.  Sweating too much.  Hormone changes.  Oily  skin.  A weak disease-fighting system (immunesystem). What are the signs or symptoms? Symptoms of this condition include:  A rash of light or dark patches on your skin. The rash may have: ? Patches of tan or pink spots (on light skin). ? Patches of white or brown spots (on dark skin). ? Patches of skin that do not tan. ? Well-marked edges. ? Scales on the discolored areas.  Mild itching. How is this diagnosed? A health care provider can usually diagnose this condition by looking at your skin. During the exam, he or she may use ultraviolet (UV) light to see how much of your skin has been affected. In some cases, a skin sample may be taken by scraping the rash. This sample will be viewed under a microscope to check for yeast overgrowth. How is this treated? Treatment for this condition may include:  Dandruff shampoo that is applied to the affected skin during showers or bathing.  Over-the-counter medicated skin cream, lotion, or soaps.  Prescription antifungal medicine in the form of skin cream or pills.  Medicine to help reduce itching. Follow these instructions at home:  Take over-the-counter and prescription medicines only as told by your health care provider.  Apply dandruff shampoo  to the affected area if your health care provider told you to do that. You may be instructed to scrub the affected skin for several minutes each day.  Do not scratch the affected area of skin.  Avoid hot and humid conditions.  Do not use tanning booths.  Try to avoid sweating a lot. Contact a health care provider if:  Your symptoms get worse.  You have a fever.  You have redness, swelling, or pain at the site of your rash.  You have fluid or blood coming from your rash.  Your rash feels warm to the touch.  You have pus or a bad smell coming from your rash.  Your rash returns (recurs) after treatment. Summary  Tinea versicolor is a common fungal infection of the skin. It causes a  rash that appears as light or dark patches on the skin.  The rash most often occurs on the chest, back, neck, or upper arms.  A health care provider can usually diagnose this condition by looking at your skin.  Treatment may include applying shampoo to the skin and taking or applying medicines. This information is not intended to replace advice given to you by your health care provider. Make sure you discuss any questions you have with your health care provider. Document Revised: 06/25/2017 Document Reviewed: 03/16/2017 Elsevier Patient Education  2020 [place].  Chronic Constipation  Chronic constipation is a condition in which a person has three or fewer bowel movements a week, for three months or longer. This condition is especially common in older adults. The two main kinds of chronic constipation are secondary constipation and functional constipation. Secondary constipation results from another condition or a treatment. Functional constipation, also called primary or idiopathic constipation, is divided into three types:  Normal transit constipation. In this type, movement of stool through the colon (stool transit) occurs normally.  Slow transit constipation. In this type, stool moves slowly through the colon.  Outlet constipation or pelvic floor dysfunction. In this type, the nerves and muscles that empty the rectum do not work normally. What are the causes? Causes of secondary constipation may include:  Failing to drink enough fluid, eat enough food or fiber, or get physically active.  Pregnancy.  A tear in the anus (anal fissure).  Blockage in the bowel (bowel obstruction).  Narrowing of the bowel (bowel stricture).  Having a long-term medical condition, such as: ? Diabetes. ? Hypothyroidism. ? Multiple sclerosis. ? Parkinson disease. ? Stroke. ? Spinal cord injury. ? Dementia. ? Colon cancer. ? Inflammatory bowel disease (IBD). ? Iron-deficiency  anemia. ? Outward collapse of the rectum (rectal prolapse). ? Hemorrhoids.  Taking certain medicines, including: ? Narcotics. These are a certain type of prescription pain medicine. ? Antacids. ? Iron supplements. ? Water pills (diuretics). ? Certain blood pressure medicines. ? Anti-seizure medicines. ? Antidepressants. ? Medicines for Parkinson disease. The cause of functional constipation is not known, but some conditions are associated with it. These conditions include:  Stress.  Problems in the nerves and muscles that control stool transit.  Weak or impaired pelvic floor muscles. What increases the risk? You may be at higher risk for chronic constipation if you:  Are older than age 79.  Are female.  Live in a long-term care facility.  Do not get much exercise or physical activity (have a sedentary lifestyle).  Do not drink enough fluids.  Do not eat enough food, especially fiber.  Have a long-term disease.  Have a mental health disorder or  eating disorder.  Take many medicines. What are the signs or symptoms? The main symptom of chronic constipation is having three or fewer bowel movements a week for several weeks. Other signs and symptoms may vary from person to person. These include:  Pushing hard (straining) to pass stool.  Painful bowel movements.  Having hard or lumpy stools.  Having lower belly discomfort, such as cramps or bloating.  Being unable to have a bowel movement when you feel the urge.  Feeling like you still need to pass stool after a bowel movement.  Feeling that you have something in your rectum that is blocking or preventing bowel movements.  Seeing blood on the toilet paper or in your stool.  Worsening confusion (in older adults). How is this diagnosed? This condition may be diagnosed based on:  Symptoms and medical history. You will be asked about your symptoms, lifestyle, diet, and any medicines that you are taking.  Physical  exam. ? Your belly (abdomen) will be examined. ? A digital rectal exam may be done. For this exam, a health care provider places a lubricated, gloved finger into the rectum.  Other tests to check for any underlying causes of your constipation. These may be ordered if you have bleeding in your rectum, weight loss, or a family history of colon cancer. In these cases, you may have: ? Imaging studies of the colon. These may include X-ray, ultrasound, or CT scan. ? Blood tests. ? A procedure to examine the inside of your colon (colonoscopy). ? More specialized tests to check:  Whether your anal sphincter works well. This is a ring-shaped muscle that controls the closing of the anus.  How well food moves through your colon. ? Tests to measure the nerve signal in your pelvic floor muscles (electromyography). How is this treated? Treatment for chronic constipation depends on the cause. Most often, treatment starts with:  Being more active and getting regular exercise.  Drinking more fluids.  Adding fiber to your diet. Sources of fiber include fruits, vegetables, whole grains, and fiber supplements.  Using medicines such as stool softeners or medicines that increase contractions in your digestive system (pro-motility agents).  Training your pelvic muscles with biofeedback.  Surgery, if there is obstruction. Treatment for secondary chronic constipation depends on the underlying condition. You may need to:  Stop or change some medicines if they cause constipation.  Use a fiber supplement (bulk laxative) or stool softener.  Use prescription laxative. This works by [employer] into your colon (osmotic laxative). You may also need to see a specialist who treats conditions of the digestive system (gastroenterologist). Follow these instructions at home:   Take over-the-counter and prescription medicines only as told by your health care provider.  If you are taking a laxative, take it as  told by your health care provider.  Eat a balanced diet that includes enough fiber. Ask your health care provider to recommend a diet that is right for you.  Drink clear fluids, especially water. Avoid drinking alcohol, caffeine, and soda.  Drink enough fluid to keep your urine pale yellow.  Get some physical activity every day. Ask your health care provider what physical activities are safe for you.  Get colon cancer screenings as told by your health care provider.  Keep all follow-up visits as told by your health care provider. This is important. Contact a health care provider if:  You are having three or fewer bowel movements a week.  Your stools are hard or lumpy.  You notice blood on the toilet paper or in your stool after you have a bowel movement.  You have unexplained weight loss.  You have rectum (rectal) pain.  You have stool leakage.  You experience nausea or vomiting. Get help right away if:  You have rectal bleeding or you pass blood clots.  You have severe rectal pain.  You have body tissue that pushes out (protrudes) from your anus.  You have severe pain or bloating (distension) in your abdomen.  You have vomiting that you cannot control. Summary  Chronic constipation is a condition in which a person has three or fewer bowel movements a week, for three months or longer.  You may have a higher risk for this condition if you are an older adult, or if you do not drink enough water or get enough physical activity (are sedentary).  Treatment for this condition depends on the cause. Most treatments for chronic constipation include adding fiber to your diet, drinking more fluids, and getting more physical activity. You may also need to treat any underlying medical conditions or stop or change certain medicines if they cause constipation.  If lifestyle changes do not relieve constipation, your health care provider may recommend taking a laxative. This  information is not intended to replace advice given to you by your health care provider. Make sure you discuss any questions you have with your health care provider. Document Revised: 06/25/2017 Document Reviewed: 03/30/2017 Elsevier Patient Education  [place].

## 2019-11-16 NOTE — Progress Notes (Signed)
Subjective:    Patient ID: Cindy Luna, female    DOB: 1981/07/04, 39 y.o.   MRN: [id number]  No chief complaint on file.   HPI Patient was seen today for f/u.  Patient tried Selsun Blue shampoo x7 days for tinea versicolor.  Noticed shampoo dry her skin out and improved the rash some.  Pt still with some of the rash.  Pt spoke to nephrologist about pain/numbness and tingling in fingertips below RUE fistula site.  As pt had a fistulogram, need for further imaging not indicated at this time.  Pt's tech now switching where she sticks the fistula which has helped some.  Pt still constipation.  Taking Linzess.  Scheduled to start continue MVI without iron.  Hoping this will help.  Pt notes occasional fatigue.  Wearing CPAP nightly.  Hemoglobin stable.  In the past received iron infusions.   Past Medical History:  Diagnosis Date  . Anemia   . Depression   . DM (diabetes mellitus) ([place])   . DUB (dysfunctional uterine bleeding) 06/24/2016  . End stage renal disease ([place])   . HLD (hyperlipidemia)   . HTN (hypertension)   . Lupus ([place])   . Ovarian cyst 09/01/2016  . Renal disorder   . Sleep apnea     Allergies  Allergen Reactions  . Azithromycin Hives  . Benadryl [Diphenhydramine Hcl] Hives  . Doxycycline Hyclate   . Vancomycin Swelling  . Adhesive [Tape] Rash    ROS General: Denies fever, chills, night sweats, changes in weight, changes in appetite HEENT: Denies headaches, ear pain, changes in vision, rhinorrhea, sore throat CV: Denies CP, palpitations, SOB, orthopnea Pulm: Denies SOB, cough, wheezing GI: Denies abdominal pain, nausea, vomiting, diarrhea  +constipation GU: Denies dysuria, hematuria, frequency, vaginal discharge Msk: Denies muscle cramps, joint pains Neuro: Denies weakness, tingling +numbness in fingers Skin: Denies bruising  +rash Psych: Denies depression, anxiety, hallucinations     Objective:    Blood pressure 110/68, pulse 88, temperature (!) 96.5 F  (35.8 C), temperature source Temporal, weight 300 lb (136.1 kg), SpO2 98 %.  Gen. Pleasant, well-nourished, in no distress, normal affect  HEENT: [place]/AT, face symmetric, no scleral icterus, PERRLA, EOMI, nares patent without drainage, lymphedema of neck Lungs: no accessory muscle use, CTAB, no wheezes or rales Cardiovascular: RRR, no peripheral edema Musculoskeletal: fistulas in b/l arms.  No deformities, no cyanosis or clubbing, normal tone Neuro:  A&Ox3, CN II-XII intact, normal gait Skin:  Warm, dry, intact.  Hypopigmentation of chest and neck.  Rough, dry skin of forearms.  Wt Readings from Last 3 Encounters:  10/12/19 (!) 301 lb (136.5 kg)  07/13/19 297 lb (134.7 kg)  01/05/19 (!) 314 lb (142.4 kg)    Lab Results  Component Value Date   WBC 10.4 04/29/2018   HGB 12.3 04/29/2018   HCT 36.4 04/29/2018   PLT 273 04/29/2018   GLUCOSE 110 (H) 04/29/2018   CHOL 166 04/18/2018   TRIG 334 (H) 04/18/2018   HDL 38 (L) 04/18/2018   LDLCALC 61 04/18/2018   ALT 9 04/29/2018   AST 10 (L) 04/29/2018   NA 136 04/29/2018   K 3.4 (L) 04/29/2018   CL 98 04/29/2018   CREATININE 6.18 (H) 04/29/2018   BUN 14 04/29/2018   CO2 23 04/29/2018   TSH 2.57 12/06/2015   HGBA1C 5.3 04/18/2018    Assessment/Plan:  Tinea versicolor  -will d/c selsan blue shampoo. -For continued or worsening disorder dermatology. - Plan: ketoconazole (NIZORAL) 2 % shampoo  Chronic constipation -Plans to switch MVI to non iron-containing formulation which may provide relief -Continue Linzess 145 mg -Continue to monitor  Numbness -Improving -Continue switching locations accessed on fistula site  Other eczema -Continue using hydrating moisturizer frequently -Given handout  ESRD (end stage renal disease) on dialysis ([place]) -Continue HD on M, W, F -Continue follow-up with nephrology -Continue renal diet, Renvela, Sensipar, and MVI  F/u prn in 1 month  Grier Mitts, MD

## 2019-11-22 ENCOUNTER — Telehealth: Payer: Self-pay | Admitting: Family Medicine

## 2019-11-22 NOTE — Telephone Encounter (Signed)
[place]: [place]

## 2019-11-22 NOTE — Telephone Encounter (Signed)
Rx already sent to pt pharmacy

## 2019-11-23 ENCOUNTER — Other Ambulatory Visit: Payer: Self-pay | Admitting: Family Medicine

## 2019-11-23 DIAGNOSIS — B360 Pityriasis versicolor: Secondary | ICD-10-CM

## 2019-12-21 ENCOUNTER — Ambulatory Visit: Payer: Medicare Other | Admitting: Family Medicine

## 2020-04-29 NOTE — Progress Notes (Signed)
Electrophysiology Office Note:    Date:  04/30/2020   ID:  JARROD BODKINS, DOB 08-03-1980, MRN [id number]  PCP:  Billie Ruddy, MD  [place] Cardiologist:  No primary care provider on file.  [facility] Electrophysiologist:  None   Referring MD: Billie Ruddy, MD   Chief Complaint: Chest pain  History of Present Illness:    Cindy Luna is a 39 y.o. female who presents for an evaluation of chest pain at the request of Dr. Volanda Napoleon. Their medical history includes ESRD on dialysis, hypertension, hyperlipidemia, diabetes, sleep apnea, morbid obesity.  She has been evaluated for possible weight loss surgery but this was postponed due to anemia related to ESRD.  Her nephrologist has referred her to [place] because of intermittent chest pain.  Today she tells me that she has intermittent chest pain that occurs both at rest and with exertion but predominantly while she is at rest.  The pain is a cramping sensation located in the epigastrium and lower chest area.  It is nonradiating.  Is not associated with presyncope or syncope, nausea, vomiting, shortness of breath.  At last for minutes at a time and resolves without a specific intervention.  Nothing reliably brings on the pain.  She tells me that eating may actually make the pain worse.  She is not very active.  She tells me that she used to take walks but now does not do that very often.  She is monitored closely obviously while she is on dialysis sessions.  Sometimes her heart rate dropped into the 40s and 50s while on the machine.  Heart rates start off in the 185 systolic and then gradually drift down during the session.  Past Medical History:  Diagnosis Date  . Anemia   . Depression   . DM (diabetes mellitus) ([place])   . DUB (dysfunctional uterine bleeding) 06/24/2016  . End stage renal disease ([place])   . HLD (hyperlipidemia)   . HTN (hypertension)   . Lupus ([place])   . Ovarian cyst 09/01/2016  . Renal disorder   . Sleep apnea      Past Surgical History:  Procedure Laterality Date  . AV FISTULA PLACEMENT  2016 right, 2009 left   left and right are both working   . CESAREAN SECTION    . OTHER SURGICAL HISTORY Left    AV fistula stents, arm  . TUBAL LIGATION  2010    Current Medications: Current Meds  Medication Sig  . cetirizine (ZYRTEC) 10 MG tablet Take 10 mg by mouth daily.  . cinacalcet (SENSIPAR) 60 MG tablet Take 60 mg by mouth daily with supper.   . linaclotide (LINZESS) 145 MCG CAPS capsule Take 145 mcg by mouth as needed.  . multivitamin (RENA-VIT) TABS tablet Take 1 tablet by mouth daily.  . norethindrone (AYGESTIN) 5 MG tablet Take 1 tablet by mouth daily.  . pantoprazole (PROTONIX) 40 MG tablet Take 40 mg by mouth daily.  . sevelamer carbonate (RENVELA) 800 MG tablet Take 800 mg by mouth. Take 5 tablets three times daily with meal and 2 tablets two times with snack     Allergies:   Azithromycin, Benadryl [diphenhydramine hcl], Doxycycline hyclate, Vancomycin, and Adhesive [tape]   Social History   Socioeconomic History  . Marital status: Single    Spouse name: Not on file  . Number of children: 1  . Years of education: Not on file  . Highest education level: Not on file  Occupational History  .  Not on file  Tobacco Use  . Smoking status: Former Smoker    Types: Cigars    Start date: 2018    Quit date: 07/2018    Years since quitting: 1.7  . Smokeless tobacco: Never Used  . Tobacco comment: 1 black and mild cigar every day   Substance and Sexual Activity  . Alcohol use: No  . Drug use: No  . Sexual activity: Yes    Partners: Male    Birth control/protection: None  Other Topics Concern  . Not on file  Social History Narrative   ** Merged History Encounter **       Level of education: college    Employment: unemployed    Transportation: car    Exercise: no   Housing situation: apt   Relationships (safe): yes   Contact for message (voicemail): [phone]   Social  Determinants of Health   Financial Resource Strain:   . Difficulty of Paying Living Expenses: Not on file  Food Insecurity:   . Worried About Charity fundraiser in the Last Year: Not on file  . Ran Out of Food in the Last Year: Not on file  Transportation Needs:   . Lack of Transportation (Medical): Not on file  . Lack of Transportation (Non-Medical): Not on file  Physical Activity:   . Days of Exercise per Week: Not on file  . Minutes of Exercise per Session: Not on file  Stress:   . Feeling of Stress : Not on file  Social Connections:   . Frequency of Communication with Friends and Family: Not on file  . Frequency of Social Gatherings with Friends and Family: Not on file  . Attends Religious Services: Not on file  . Active Member of Clubs or Organizations: Not on file  . Attends Archivist Meetings: Not on file  . Marital Status: Not on file     Family History: The patient's family history includes Autism in her daughter; Breast cancer in her maternal aunt; Colon cancer in her maternal grandmother; Diabetes in her mother and another family member; Hypertension in her mother and another family member; Prostate cancer in her maternal grandfather; Thyroid disease in her sister.  ROS:   Please see the history of present illness.    All other systems reviewed and are negative.  EKGs/Labs/Other Studies Reviewed:    The following studies were reviewed today: Echo   October 2016 echo personally reviewed Left ventricular function normal.  No significant valvular abnormalities.   EKG:  The ekg ordered today demonstrates sinus rhythm.  Recent Labs: No results found for requested labs within last 8760 hours.  Recent Lipid Panel    Component Value Date/Time   CHOL 166 04/18/2018 1629   TRIG 334 (H) 04/18/2018 1629   HDL 38 (L) 04/18/2018 1629   CHOLHDL 4.4 04/18/2018 1629   LDLCALC 61 04/18/2018 1629    Physical Exam:    VS:  BP 127/70   Pulse 97   Ht 5\' 8"   (1.727 m)   Wt (!) 306 lb 12.8 oz (139.2 kg)   SpO2 97%   BMI 46.65 kg/m     Wt Readings from Last 3 Encounters:  04/30/20 (!) 306 lb 12.8 oz (139.2 kg)  11/16/19 300 lb (136.1 kg)  10/12/19 (!) 301 lb (136.5 kg)     GEN: Well nourished, well developed in no acute distress.  Morbidly obese HEENT: Normal NECK: No JVD; No carotid bruits LYMPHATICS: No lymphadenopathy CARDIAC: RRR,  no murmurs, rubs, gallops.  AV fistula present in the left upper extremity with good pulsation. RESPIRATORY:  Clear to auscultation without rales, wheezing or rhonchi  ABDOMEN: Soft, non-tender, non-distended MUSCULOSKELETAL:  No edema; No deformity  SKIN: Warm and dry.  Tinea versicolor NEUROLOGIC:  Alert and oriented x 3 PSYCHIATRIC:  Normal affect   ASSESSMENT:    1. Chest tightness   2. ESRD (end stage renal disease) on dialysis ([facility])   3. Class 3 severe obesity with serious comorbidity and body mass index (BMI) of 45.0 to 49.9 in adult, unspecified obesity type ([place])   4. OSA (obstructive sleep apnea)    PLAN:    In order of problems listed above:  1. Chest tightness Her chest pain sounds atypical in nature.  It may be related to esophageal spasm or GERD given the relation to food.  As a reassurance mechanism recommend we pursue an exercise ECG test to quantify her exercise capacity and to assess for any ischemic symptoms or ECG changes.  We will follow up based on the results of this test.  2.  ESRD on dialysis Tolerating her dialysis well.  3.  Morbid obesity Patient is currently under evaluation for weight loss surgery.  4.  Obstructive sleep apnea On CPAP.     Medication Adjustments/Labs and Tests Ordered: Current medicines are reviewed at length with the patient today.  Concerns regarding medicines are outlined above.  Orders Placed This Encounter  Procedures  . Exercise Tolerance Test  . EKG 12-Lead   No orders of the defined types were placed in this  encounter.    Signed, Lars Mage, MD, [facility]  04/30/2020 10:36 AM    Electrophysiology [place]

## 2020-04-30 ENCOUNTER — Encounter: Payer: Self-pay | Admitting: Cardiology

## 2020-04-30 ENCOUNTER — Other Ambulatory Visit: Payer: Self-pay

## 2020-04-30 ENCOUNTER — Ambulatory Visit (INDEPENDENT_AMBULATORY_CARE_PROVIDER_SITE_OTHER): Payer: Medicare (Managed Care) | Admitting: Cardiology

## 2020-04-30 VITALS — BP 127/70 | HR 97 | Ht 68.0 in | Wt 306.8 lb

## 2020-04-30 DIAGNOSIS — G4733 Obstructive sleep apnea (adult) (pediatric): Secondary | ICD-10-CM

## 2020-04-30 DIAGNOSIS — N186 End stage renal disease: Secondary | ICD-10-CM

## 2020-04-30 DIAGNOSIS — Z6841 Body Mass Index (BMI) 40.0 and over, adult: Secondary | ICD-10-CM

## 2020-04-30 DIAGNOSIS — R072 Precordial pain: Secondary | ICD-10-CM

## 2020-04-30 DIAGNOSIS — Z992 Dependence on renal dialysis: Secondary | ICD-10-CM

## 2020-04-30 DIAGNOSIS — R0789 Other chest pain: Secondary | ICD-10-CM

## 2020-04-30 NOTE — Patient Instructions (Addendum)
ADDENDUM:  Pt unable to get covid test or quarantine for exercise treadmill test.  Per Dr. Eduard Clos.  Pt aware of change in test.  Medication Instructions:  Your physician recommends that you continue on your current medications as directed. Please refer to the Current Medication list given to you today.  *If you need a refill on your cardiac medications before your next appointment, please call your pharmacy*  Lab Work: None ordered.  If you have labs (blood work) drawn today and your tests are completely normal, you will receive your results only by: Marland Kitchen MyChart Message (if you have MyChart) OR . A paper copy in the mail If you have any lab test that is abnormal or we need to change your treatment, we will call you to review the results.  Testing/Procedures: Your physician has requested that you have an exercise tolerance test.   Please schedule for exercise stress test   Follow-Up: At [facility], you and your health needs are our priority.  As part of our continuing mission to provide you with exceptional heart care, we have created designated Provider Care Teams.  These Care Teams include your primary Cardiologist (physician) and Advanced Practice Providers (APPs -  Physician Assistants and Nurse Practitioners) who all work together to provide you with the care you need, when you need it.  Your next appointment:   Your physician wants you to follow-up based on results of your stress test   Exercise Stress Test  An exercise stress test is a test that is done to collect information about how your heart functions during exercise. The test is done while you are walking on a treadmill or using an exercise bike. The goal is to raise your heart rate and "stress" the heart. The heart is evaluated before, during, and after you exercise. An electrocardiogram (ECG) will be used to monitor the heart, and your blood pressure will also be monitored. In some cases,  nuclear scanning or an ultrasound of the heart (echocardiogram) will also be done to evaluate your heart. An exercise stress test is done to look for coronary artery disease (CAD). The test may also be done to:  Evaluate your limits of exercise during cardiac rehabilitation.  Check for high blood pressure during exercise.  Check how well you can exercise after such treatments as coronary stenting or new medicines.  Check for problems with blood flow to your arms and legs during exercise. If you have an abnormal test result, this may mean that you are not getting enough blood flow to your heart during exercise. More testing may be needed to understand why your test was not normal. Tell a health care provider about:  Any allergies you have.  All medicines you are taking, including vitamins, herbs, eye drops, creams, and over-the-counter medicines.  Any blood disorders you have.  Any surgeries you have had.  Any medical conditions you have.  Whether you are pregnant or may be pregnant. What are the risks? Generally, this is a safe procedure. However, problems may occur, including:  Pain or pressure in the following areas: ? Chest. ? Jaw or neck. ? Between your shoulder blades. ? Down your left arm.  Dizziness or lightheadedness.  Shortness of breath.  Increased or irregular heartbeats.  Nausea or vomiting.  Heart attack (rare).  Life-threatening abnormal heart rhythm (rare). What happens before the procedure?  Follow instructions from your health care provider about eating or drinking restrictions. ? You may be told to avoid  all forms of caffeine for 24 hours before the test. This includes coffee, tea (even decaffeinated tea), caffeinated sodas, chocolate, cocoa, and certain pain medicines.  Ask your health care provider about: ? Taking over-the-counter medicines, vitamins, herbs, and supplements. ? Changing or stopping your regular medicines. This is especially  important if you are taking diabetes medicines or beta-blocker medicines.  If you have diabetes, ask how you are to take your insulin or pills. It is common to adjust your insulin dose the morning of the test.  If you are taking beta-blocker medicines, it is important to talk to your health care provider about these medicines well before the date of your test. Taking beta-blocker medicines may interfere with the test. In some cases, these medicines may need to be changed or stopped 24 hours or more before the test.  If you wear a nitroglycerin patch, it may need to be removed prior to the test. Ask your health care provider if the patch should be removed before the test.  If you use an inhaler for any breathing condition, bring it with you to the test.  Do not apply lotions, powders, creams, or oils on your chest prior to the test.  Wear loose-fitting clothes and comfortable walking shoes.  Do not use any products that contain nicotine or tobacco, such as cigarettes and e-cigarettes, for 4 hours before the test or as told by your health care provider. If you need help quitting, ask your health care provider. What happens during the procedure?  Multiple electrodes will be attached to your chest.  Multiple wires will be attached to the electrodes. These will transfer the electrical impulses from your heart to the ECG machine. Your heart will be monitored both at rest and while exercising.  If you are also having an echocardiogram or nuclear scanning, images of your heart will be taken before and after you exercise.  A blood pressure cuff will be placed around your arm to measure your blood pressure throughout the test. You will feel it tighten and loosen throughout the test.  You will walk on a treadmill or use a stationary bike. If you cannot use these, you may be asked to turn a crank with your hands.  You will start at a slow pace or level on the exercise machine. The exercise difficulty  will be slowly increased to raise your heart rate. In the case of a treadmill, the speed and incline will gradually be increased.  You may be asked to periodically breathe into a tube. This measures the gases you breathe out.  You will be asked how you are feeling throughout the test. You will be asked to rate your level of exertion.  Tell the staff right away if you feel: ? Chest pain. ? Dizziness. ? Shortness of breath. ? Too fatigued to continue. ? Pain or aching in your legs or arms.  You will exercise until you have symptoms or until you reach a target heart rate. The test will also be stopped if you have changes in your blood pressure or ECG readings, or if you develop an irregular heartbeat (arrhythmia). The procedure may vary among health care providers and hospitals. What happens after the procedure?  You will sit down and recover from the exercise. Your blood pressure, heart rate, and ECG will be monitored until you recover.  You may return to your normal schedule, including diet, activities, and medicines, unless your health care provider tells you otherwise.  It is up to  you to get your test results. Ask your health care provider, or the department that is doing the test, when your results will be ready. Summary  An exercise stress test is a test that is done to collect information about how your heart functions during exercise.  This test is done to look for coronary artery disease (CAD).  During this test, you will walk on a treadmill or use an exercise bike to raise your heart rate.  It is important to follow instructions from your health care provider about eating and drinking restrictions before the test. This may include avoiding caffeine and certain medicines before the test. This information is not intended to replace advice given to you by your health care provider. Make sure you discuss any questions you have with your health care provider. Document Revised:  04/15/2017 Document Reviewed: 09/16/2016 Elsevier Patient Education  2020 [employer].

## 2020-05-02 NOTE — Addendum Note (Signed)
Addended by: Willeen Cass A on: 05/02/2020 11:12 AM   Modules accepted: Orders

## 2020-05-16 ENCOUNTER — Telehealth (HOSPITAL_COMMUNITY): Payer: Self-pay

## 2020-05-16 NOTE — Telephone Encounter (Signed)
Spoke with the patient, detailed instructions given. She stated that she understood and would be here for her test. Asked to call back with any questions. S.Christiaan Strebeck EMTP 

## 2020-05-21 ENCOUNTER — Other Ambulatory Visit: Payer: Self-pay

## 2020-05-21 ENCOUNTER — Ambulatory Visit (HOSPITAL_COMMUNITY): Payer: Medicare (Managed Care) | Attending: Cardiology

## 2020-05-21 DIAGNOSIS — R072 Precordial pain: Secondary | ICD-10-CM | POA: Insufficient documentation

## 2020-05-21 LAB — MYOCARDIAL PERFUSION IMAGING
LV dias vol: 153 mL (ref 46–106)
LV sys vol: 60 mL
Peak HR: 105 {beats}/min
Rest HR: 87 {beats}/min
SDS: 4
SRS: 3
SSS: 7
TID: 1.11

## 2020-05-21 MED ORDER — TECHNETIUM TC 99M TETROFOSMIN IV KIT
11.9000 | PACK | Freq: Once | INTRAVENOUS | Status: AC | PRN
  Administered 2020-05-21: 10.9 via INTRAVENOUS
  Filled 2020-05-21: qty 12

## 2020-05-21 MED ORDER — REGADENOSON 0.4 MG/5ML IV SOLN
0.4000 mg | Freq: Once | INTRAVENOUS | Status: AC
  Administered 2020-05-21: 0.4 mg via INTRAVENOUS

## 2020-05-21 MED ORDER — TECHNETIUM TC 99M TETROFOSMIN IV KIT
32.8000 | PACK | Freq: Once | INTRAVENOUS | Status: AC | PRN
  Administered 2020-05-21: 32.8 via INTRAVENOUS
  Filled 2020-05-21: qty 33

## 2020-05-23 ENCOUNTER — Ambulatory Visit (HOSPITAL_COMMUNITY): Payer: Medicare Other

## 2020-05-29 ENCOUNTER — Encounter: Payer: Self-pay | Admitting: *Deleted

## 2020-05-30 ENCOUNTER — Ambulatory Visit: Payer: Medicaid Other | Admitting: Physician Assistant

## 2020-07-30 ENCOUNTER — Other Ambulatory Visit: Payer: Self-pay

## 2020-07-30 ENCOUNTER — Ambulatory Visit (INDEPENDENT_AMBULATORY_CARE_PROVIDER_SITE_OTHER): Payer: 59

## 2020-07-30 DIAGNOSIS — Z0000 Encounter for general adult medical examination without abnormal findings: Secondary | ICD-10-CM | POA: Diagnosis not present

## 2020-07-30 NOTE — Progress Notes (Addendum)
Subjective:   Cindy Luna is a 40 y.o. female who presents for an Initial Medicare Annual Wellness Visit.  Virtual Visit via Video Note  I connected with Cindy Luna on 07/30/20 at  9:45 AM EST by a video enabled telemedicine application and verified that I am speaking with the correct person using two identifiers.  Location: Patient: Home  Provider: Office    I discussed the limitations of evaluation and management by telemedicine and the availability of in person appointments. The patient expressed understanding and agreed to proceed.        Ofilia Neas, LPN    Review of Systems    N/A  Cardiac Risk Factors include: obesity (BMI >30kg/m2)     Objective:    There were no vitals filed for this visit. There is no height or weight on file to calculate BMI.  Advanced Directives 07/30/2020 04/29/2018 04/22/2018 04/18/2018 11/27/2015 11/12/2015 05/24/2015  Does Patient Have a Medical Advance Directive? No No No No No No No  Would patient like information on creating a medical advance directive? No - Patient declined - No - Patient declined No - Patient declined No - patient declined information No - patient declined information No - patient declined information    Current Medications (verified) Outpatient Encounter Medications as of 07/30/2020  Medication Sig  . ampicillin (PRINCIPEN) 500 MG capsule Take by mouth.  . cetirizine (ZYRTEC) 10 MG tablet Take 10 mg by mouth daily.  . cinacalcet (SENSIPAR) 60 MG tablet Take 60 mg by mouth daily with supper.   . linaclotide (LINZESS) 145 MCG CAPS capsule Take 145 mcg by mouth as needed.  . multivitamin (RENA-VIT) TABS tablet Take 1 tablet by mouth daily.  . norethindrone (AYGESTIN) 5 MG tablet Take 1 tablet by mouth daily.  . pantoprazole (PROTONIX) 40 MG tablet Take 40 mg by mouth daily.  . sevelamer carbonate (RENVELA) 800 MG tablet Take 800 mg by mouth. Take 5 tablets three times daily with meal and 2 tablets two times with  snack   No facility-administered encounter medications on file as of 07/30/2020.    Allergies (verified) Azithromycin, Benadryl [diphenhydramine hcl], Doxycycline hyclate, Vancomycin, and Adhesive [tape]   History: Past Medical History:  Diagnosis Date  . Anemia   . Depression   . DM (diabetes mellitus) ([place])   . DUB (dysfunctional uterine bleeding) 06/24/2016  . End stage renal disease ([place])   . HLD (hyperlipidemia)   . HTN (hypertension)   . Lupus ([place])   . Ovarian cyst 09/01/2016  . Renal disorder   . Sleep apnea    Past Surgical History:  Procedure Laterality Date  . AV FISTULA PLACEMENT  2016 right, 2009 left   left and right are both working   . CESAREAN SECTION    . OTHER SURGICAL HISTORY Left    AV fistula stents, arm  . TUBAL LIGATION  2010   Family History  Problem Relation Age of Onset  . Hypertension Mother   . Diabetes Mother   . Autism Daughter   . Thyroid disease Sister   . Colon cancer Maternal Grandmother   . Prostate cancer Maternal Grandfather   . Breast cancer Maternal Aunt   . Diabetes Other        both sides of the fam  . Hypertension Other        both sides of the fam   Social History   Socioeconomic History  . Marital status: Single    Spouse name:  Not on file  . Number of children: 1  . Years of education: Not on file  . Highest education level: Not on file  Occupational History  . Not on file  Tobacco Use  . Smoking status: Former Smoker    Types: Cigars    Start date: 2018    Quit date: 07/2018    Years since quitting: 2.0  . Smokeless tobacco: Never Used  . Tobacco comment: 1 black and mild cigar every day   Substance and Sexual Activity  . Alcohol use: No  . Drug use: No  . Sexual activity: Yes    Partners: Male    Birth control/protection: None  Other Topics Concern  . Not on file  Social History Narrative   ** Merged History Encounter **       Level of education: college    Employment: unemployed     Transportation: car    Exercise: no   Housing situation: apt   Relationships (safe): yes   Contact for message (voicemail): [phone]   Social Determinants of Health   Financial Resource Strain: Low Risk   . Difficulty of Paying Living Expenses: Not hard at all  Food Insecurity: No Food Insecurity  . Worried About Charity fundraiser in the Last Year: Never true  . Ran Out of Food in the Last Year: Never true  Transportation Needs: No Transportation Needs  . Lack of Transportation (Medical): No  . Lack of Transportation (Non-Medical): No  Physical Activity: Inactive  . Days of Exercise per Week: 0 days  . Minutes of Exercise per Session: 0 min  Stress: No Stress Concern Present  . Feeling of Stress : Not at all  Social Connections: Moderately Isolated  . Frequency of Communication with Friends and Family: More than three times a week  . Frequency of Social Gatherings with Friends and Family: Once a week  . Attends Religious Services: More than 4 times per year  . Active Member of Clubs or Organizations: No  . Attends Archivist Meetings: Never  . Marital Status: Never married    Tobacco Counseling Counseling given: Not Answered Comment: 1 black and mild cigar every day    Clinical Intake:  Pre-visit preparation completed: Yes  Pain : No/denies pain     Nutritional Risks:  (constipation) Diabetes: No  How often do you need to have someone help you when you read instructions, pamphlets, or other written materials from your doctor or pharmacy?: 1 - Never What is the last grade level you completed in school?: College  Diabetic?No   Interpreter Needed?: No  Information entered by :: [place] of Daily Living In your present state of health, do you have any difficulty performing the following activities: 07/30/2020  Hearing? N  Vision? N  Difficulty concentrating or making decisions? N  Walking or climbing stairs? N  Dressing or  bathing? N  Doing errands, shopping? N  Preparing Food and eating ? N  Using the Toilet? N  In the past six months, have you accidently leaked urine? N  Do you have problems with loss of bowel control? N  Managing your Medications? N  Managing your Finances? N  Housekeeping or managing your Housekeeping? N  Some recent data might be hidden    Patient Care Team: Billie Ruddy, MD as PCP - General (Family Medicine)  Indicate any recent Medical Services you may have received from other than [facility] providers in the past year (date  may be approximate).     Assessment:   This is a routine wellness examination for Cindy Luna.  Hearing/Vision screen  Hearing Screening   125Hz  250Hz  500Hz  1000Hz  2000Hz  3000Hz  4000Hz  6000Hz  8000Hz   Right ear:           Left ear:           Vision Screening Comments: Patient states gets eye examined once per year. Wears glasses   Dietary issues and exercise activities discussed: Current Exercise Habits: The patient does not participate in regular exercise at present, Exercise limited by: None identified  Goals    . Exercise 3x per week (30 min per time)      Depression Screen PHQ 2/9 Scores 07/30/2020 07/13/2019 12/01/2018 04/18/2018 09/29/2017 11/27/2015 11/12/2015  PHQ - 2 Score 0 0 0 0 0 0 0  PHQ- 9 Score 0 3 - - - - -    Fall Risk Fall Risk  07/30/2020 12/01/2018  Falls in the past year? 0 0  Number falls in past yr: 0 -  Injury with Fall? 0 -  Follow up Falls evaluation completed;Falls prevention discussed -    FALL RISK PREVENTION PERTAINING TO THE HOME:  Any stairs in or around the home? No  If so, are there any without handrails? No  Home free of loose throw rugs in walkways, pet beds, electrical cords, etc? Yes  Adequate lighting in your home to reduce risk of falls? Yes   ASSISTIVE DEVICES UTILIZED TO PREVENT FALLS:  Life alert? No  Use of a cane, walker or w/c? No  Grab bars in the bathroom? Yes  Shower chair or bench in shower? No   Elevated toilet seat or a handicapped toilet? No    Cognitive Function:   Normal cognitive status assessed by direct observation by this Nurse Health Advisor. No abnormalities found.        Immunizations Immunization History  Administered Date(s) Administered  . Hepatitis B, adult 10/14/2012, 12/16/2012, 11/09/2014, 12/10/2014, 01/09/2015, 05/13/2015  . Hepatitis B, ped/adol 10/14/2012, 12/16/2012, 11/09/2014, 12/10/2014, 01/09/2015, 05/13/2015  . PFIZER SARS-COV-2 Vaccination 01/02/2020, 01/23/2020, 05/07/2020    TDAP status: Due, Education has been provided regarding the importance of this vaccine. Advised may receive this vaccine at local pharmacy or Health Dept. Aware to provide a copy of the vaccination record if obtained from local pharmacy or Health Dept. Verbalized acceptance and understanding.  Flu Vaccine status: Declined, Education has been provided regarding the importance of this vaccine but patient still declined. Advised may receive this vaccine at local pharmacy or Health Dept. Aware to provide a copy of the vaccination record if obtained from local pharmacy or Health Dept. Verbalized acceptance and understanding.  Pneumococcal vaccine status: Up to date  Covid-19 vaccine status: Completed vaccines  Qualifies for Shingles Vaccine? Yes   Zostavax completed No   Shingrix Completed?: No.    Education has been provided regarding the importance of this vaccine. Patient has been advised to call insurance company to determine out of pocket expense if they have not yet received this vaccine. Advised may also receive vaccine at local pharmacy or Health Dept. Verbalized acceptance and understanding.  Screening Tests Health Maintenance  Topic Date Due  . Hepatitis C Screening  Never done  . URINE MICROALBUMIN  Never done  . TETANUS/TDAP  Never done  . INFLUENZA VACCINE  Never done  . PAP SMEAR-Modifier  04/18/2021  . COVID-19 Vaccine  Completed  . HIV Screening  Completed     Health Maintenance  Health Maintenance Due  Topic Date Due  . Hepatitis C Screening  Never done  . URINE MICROALBUMIN  Never done  . TETANUS/TDAP  Never done  . INFLUENZA VACCINE  Never done    Colorectal cancer Screening: Not due at this age   57 status: Not due at this age   Bone Density Status: Not due at this age   Lung Cancer Screening: (Low Dose CT Chest recommended if Age 3-80 years, 30 pack-year currently smoking OR have quit w/in 15years.) does not qualify.   Lung Cancer Screening Referral: N/A   Additional Screening:  Hepatitis C Screening: does qualify;   Vision Screening: Recommended annual ophthalmology exams for early detection of glaucoma and other disorders of the eye. Is the patient up to date with their annual eye exam?  Yes  Who is the provider or what is the name of the office in which the patient attends annual eye exams? [facility]  If pt is not established with a provider, would they like to be referred to a provider to establish care? No .   Dental Screening: Recommended annual dental exams for proper oral hygiene  Community Resource Referral / Chronic Care Management: CRR required this visit?  No   CCM required this visit?  No      Plan:     I have personally reviewed and noted the following in the patient's chart:   . Medical and social history . Use of alcohol, tobacco or illicit drugs  . Current medications and supplements . Functional ability and status . Nutritional status . Physical activity . Advanced directives . List of other physicians . Hospitalizations, surgeries, and ER visits in previous 12 months . Vitals . Screenings to include cognitive, depression, and falls . Referrals and appointments  In addition, I have reviewed and discussed with patient certain preventive protocols, quality metrics, and best practice recommendations. A written personalized care plan for preventive services as well as general  preventive health recommendations were provided to patient.     Ofilia Neas, LPN   01/26/2201   Nurse Notes: None

## 2020-07-30 NOTE — Patient Instructions (Signed)
Preventive Care 21-39 Years Old, Female Preventive care refers to visits with your health care provider and lifestyle choices that can promote health and wellness. This includes:  A yearly physical exam. This may also be called an annual well check.  Regular dental visits and eye exams.  Immunizations.  Screening for certain conditions.  Healthy lifestyle choices, such as eating a healthy diet, getting regular exercise, not using drugs or products that contain nicotine and tobacco, and limiting alcohol use. What can I expect for my preventive care visit? Physical exam Your health care provider will check your:  Height and weight. This may be used to calculate body mass index (BMI), which tells if you are at a healthy weight.  Heart rate and blood pressure.  Skin for abnormal spots. Counseling Your health care provider may ask you questions about your:  Alcohol, tobacco, and drug use.  Emotional well-being.  Home and relationship well-being.  Sexual activity.  Eating habits.  Work and work environment.  Method of birth control.  Menstrual cycle.  Pregnancy history. What immunizations do I need?  Influenza (flu) vaccine  This is recommended every year. Tetanus, diphtheria, and pertussis (Tdap) vaccine  You may need a Td booster every 10 years. Varicella (chickenpox) vaccine  You may need this if you have not been vaccinated. Human papillomavirus (HPV) vaccine  If recommended by your health care provider, you may need three doses over 6 months. Measles, mumps, and rubella (MMR) vaccine  You may need at least one dose of MMR. You may also need a second dose. Meningococcal conjugate (MenACWY) vaccine  One dose is recommended if you are age 19-21 years and a first-year college student living in a residence hall, or if you have one of several medical conditions. You may also need additional booster doses. Pneumococcal conjugate (PCV13) vaccine  You may need  this if you have certain conditions and were not previously vaccinated. Pneumococcal polysaccharide (PPSV23) vaccine  You may need one or two doses if you smoke cigarettes or if you have certain conditions. Hepatitis A vaccine  You may need this if you have certain conditions or if you travel or work in places where you may be exposed to hepatitis A. Hepatitis B vaccine  You may need this if you have certain conditions or if you travel or work in places where you may be exposed to hepatitis B. Haemophilus influenzae type b (Hib) vaccine  You may need this if you have certain conditions. You may receive vaccines as individual doses or as more than one vaccine together in one shot (combination vaccines). Talk with your health care provider about the risks and benefits of combination vaccines. What tests do I need?  Blood tests  Lipid and cholesterol levels. These may be checked every 5 years starting at age 20.  Hepatitis C test.  Hepatitis B test. Screening  Diabetes screening. This is done by checking your blood sugar (glucose) after you have not eaten for a while (fasting).  Sexually transmitted disease (STD) testing.  BRCA-related cancer screening. This may be done if you have a family history of breast, ovarian, tubal, or peritoneal cancers.  Pelvic exam and Pap test. This may be done every 3 years starting at age 21. Starting at age 30, this may be done every 5 years if you have a Pap test in combination with an HPV test. Talk with your health care provider about your test results, treatment options, and if necessary, the need for more tests.   Follow these instructions at home: Eating and drinking   Eat a diet that includes fresh fruits and vegetables, whole grains, lean protein, and low-fat dairy.  Take vitamin and mineral supplements as recommended by your health care provider.  Do not drink alcohol if: ? Your health care provider tells you not to drink. ? You are  pregnant, may be pregnant, or are planning to become pregnant.  If you drink alcohol: ? Limit how much you have to 0-1 drink a day. ? Be aware of how much alcohol is in your drink. In the [place] one drink equals one 12 oz bottle of beer (355 mL), one 5 oz glass of wine (148 mL), or one 1 oz glass of hard liquor (44 mL). Lifestyle  Take daily care of your teeth and gums.  Stay active. Exercise for at least 30 minutes on 5 or more days each week.  Do not use any products that contain nicotine or tobacco, such as cigarettes, e-cigarettes, and chewing tobacco. If you need help quitting, ask your health care provider.  If you are sexually active, practice safe sex. Use a condom or other form of birth control (contraception) in order to prevent pregnancy and STIs (sexually transmitted infections). If you plan to become pregnant, see your health care provider for a preconception visit. What's next?  Visit your health care provider once a year for a well check visit.  Ask your health care provider how often you should have your eyes and teeth checked.  Stay up to date on all vaccines. This information is not intended to replace advice given to you by your health care provider. Make sure you discuss any questions you have with your health care provider. Document Revised: 03/24/2018 Document Reviewed: 03/24/2018 Elsevier Patient Education  2020 [employer].

## 2021-02-24 ENCOUNTER — Other Ambulatory Visit: Payer: Self-pay

## 2021-02-25 ENCOUNTER — Encounter: Payer: Self-pay | Admitting: Family Medicine

## 2021-02-25 ENCOUNTER — Ambulatory Visit (INDEPENDENT_AMBULATORY_CARE_PROVIDER_SITE_OTHER): Payer: 59 | Admitting: Family Medicine

## 2021-02-25 VITALS — BP 124/68 | HR 102 | Temp 98.6°F | Wt 308.0 lb

## 2021-02-25 DIAGNOSIS — G629 Polyneuropathy, unspecified: Secondary | ICD-10-CM

## 2021-02-25 DIAGNOSIS — M546 Pain in thoracic spine: Secondary | ICD-10-CM | POA: Diagnosis not present

## 2021-02-25 LAB — T4, FREE: Free T4: 0.8 ng/dL (ref 0.60–1.60)

## 2021-02-25 LAB — TSH: TSH: 3.42 u[IU]/mL (ref 0.35–5.50)

## 2021-02-25 LAB — T3, FREE: T3, Free: 3.5 pg/mL (ref 2.3–4.2)

## 2021-02-25 LAB — HEMOGLOBIN A1C: Hgb A1c MFr Bld: 5.6 % (ref 4.6–6.5)

## 2021-02-25 LAB — VITAMIN B12: Vitamin B-12: 603 pg/mL (ref 211–911)

## 2021-02-25 MED ORDER — METHOCARBAMOL 500 MG PO TABS: 500.0000 mg | ORAL_TABLET | Freq: Four times a day (QID) | ORAL | 0 refills | Status: DC | PRN

## 2021-02-25 MED ORDER — HYDROCORTISONE 0.5 % EX CREA: 1.0000 "application " | TOPICAL_CREAM | Freq: Two times a day (BID) | CUTANEOUS | 0 refills | Status: DC

## 2021-02-25 MED ORDER — TOPIRAMATE 100 MG PO TABS: 100.0000 mg | ORAL_TABLET | Freq: Every day | ORAL | 0 refills | Status: DC

## 2021-02-25 MED ORDER — TRIAMCINOLONE ACETONIDE 0.1 % EX CREA: 1.0000 | TOPICAL_CREAM | Freq: Two times a day (BID) | CUTANEOUS | 0 refills | Status: DC

## 2021-02-25 MED ORDER — HYDROCODONE-ACETAMINOPHEN 5-325 MG PO TABS: 1.0000 | ORAL_TABLET | ORAL | 0 refills | Status: AC | PRN

## 2021-02-25 NOTE — Progress Notes (Signed)
   Subjective:    Patient ID: Cindy Luna, female    DOB: 1981/02/19, 40 y.o.   MRN: [id number]  HPI Here with several issues. Her PCP, Dr. Volanda Napoleon, is out of the office this week. She has not seen Dr. Volanda Napoleon in a year and a half. She sees Dr. Jimmy Footman for dialysis. First she has had numbness and tingling in both hands and both feet for several months. She says her potassium, calcium, etc have been stable and Dr. Jimmy Footman checks these frequently. Also 2 weeks ago she suddenly developed a sharp pain in the left upper back around the shoulder blade. No recent trauma. Tylenol does not help.    Review of Systems  Constitutional: Negative.   Respiratory: Negative.    Cardiovascular: Negative.   Musculoskeletal:  Positive for back pain.  Neurological:  Positive for numbness.      Objective:   Physical Exam Constitutional:      General: She is not in acute distress. Cardiovascular:     Rate and Rhythm: Normal rate and regular rhythm.     Pulses: Normal pulses.     Heart sounds: Normal heart sounds.  Pulmonary:     Effort: Pulmonary effort is normal.     Breath sounds: Normal breath sounds.  Musculoskeletal:     Comments: She is very tender in the left upper back just below the scapula. ROM of the spine is full   Neurological:     Mental Status: She is alert.          Assessment & Plan:  She has neuropathy, and we will investigate by checking a thyroid panel, an A1c, and a B12 level. She also has muscle spasms in the back, possibly form a pinched nerve. She will apply ice to the area, and she can use Robaxin and Norco for relief. She will follow up with Dr. Volanda Napoleon for these issues. We spent 35 minutes reviewing records and discussing these issues.  Alysia Penna, MD

## 2021-03-06 ENCOUNTER — Ambulatory Visit: Payer: 59 | Admitting: Family Medicine

## 2021-03-13 ENCOUNTER — Ambulatory Visit: Payer: 59 | Admitting: Family Medicine

## 2021-03-20 ENCOUNTER — Ambulatory Visit (INDEPENDENT_AMBULATORY_CARE_PROVIDER_SITE_OTHER): Payer: 59 | Admitting: Family Medicine

## 2021-03-20 ENCOUNTER — Other Ambulatory Visit: Payer: Self-pay

## 2021-03-20 ENCOUNTER — Ambulatory Visit (HOSPITAL_BASED_OUTPATIENT_CLINIC_OR_DEPARTMENT_OTHER)
Admission: RE | Admit: 2021-03-20 | Discharge: 2021-03-20 | Disposition: A | Discharge status: Home / Self Care | Payer: 59 | Source: Ambulatory Visit | Attending: Family Medicine | Admitting: Family Medicine

## 2021-03-20 ENCOUNTER — Encounter: Payer: Self-pay | Admitting: Family Medicine

## 2021-03-20 VITALS — BP 110/70 | HR 99 | Temp 98.4°F | Wt 306.0 lb

## 2021-03-20 DIAGNOSIS — K625 Hemorrhage of anus and rectum: Secondary | ICD-10-CM

## 2021-03-20 DIAGNOSIS — K648 Other hemorrhoids: Secondary | ICD-10-CM

## 2021-03-20 DIAGNOSIS — G8929 Other chronic pain: Secondary | ICD-10-CM | POA: Diagnosis present

## 2021-03-20 DIAGNOSIS — Z992 Dependence on renal dialysis: Secondary | ICD-10-CM

## 2021-03-20 DIAGNOSIS — R251 Tremor, unspecified: Secondary | ICD-10-CM

## 2021-03-20 DIAGNOSIS — N186 End stage renal disease: Secondary | ICD-10-CM

## 2021-03-20 DIAGNOSIS — S46812D Strain of other muscles, fascia and tendons at shoulder and upper arm level, left arm, subsequent encounter: Secondary | ICD-10-CM

## 2021-03-20 DIAGNOSIS — M25511 Pain in right shoulder: Secondary | ICD-10-CM | POA: Diagnosis not present

## 2021-03-20 DIAGNOSIS — G629 Polyneuropathy, unspecified: Secondary | ICD-10-CM | POA: Diagnosis not present

## 2021-03-20 NOTE — Progress Notes (Signed)
Subjective:    Patient ID: Cindy Luna, female    DOB: 12/05/1980, 40 y.o.   MRN: [id number]  Chief Complaint  Patient presents with   Follow-up    Wants to follow u from visit earlier this month with Dr Sarajane Jews -neuropathy, acute left sided thoracic back pain     HPI Patient was seen today for follow-up on several ongoing concerns.  Patient endorses continued but slightly improved left upper back pain.  Seen 8/2 by Dr. Sarajane Jews for same.  Patient notes symptoms started 3 days prior to that visit.  Patient denies overt injury but does recall moving a mattress when symptoms started.  Patient describes the pain as an intermittent "hard pinch", "like multiple bee stings on 1 spot" tenderness in that spot.  Pressure from bra strap causes pain.  No radiation or rash.  Robaxin not helping.  Tried expired opioid pain meds and Tylenol.  Pt endorses decreased ROM of right shoulder times months.  Has pain in shoulder with movement.  Patient's fistula for HD is in RUE.  Pt also notes a slight tremor in right hand when holding objects times the last few months.  No changes in medications but restarted topiramate loss.  Pt with continued numbness and tingling in LEs.  Can occur when standing, walking, or sitting.  Labs from 8/2 normal.  Patient inquires about second opinion from GI.  Seen in the past at [facility].  Had GI banding x3 by Dr. Eber Jones.  Endorses intermittent rectal bleeding.  Also taking Linzess.  Past Medical History:  Diagnosis Date   Anemia    Depression    DM (diabetes mellitus) ([place])    DUB (dysfunctional uterine bleeding) 06/24/2016   End stage renal disease ([facility])    HLD (hyperlipidemia)    HTN (hypertension)    Lupus ([facility])    Ovarian cyst 09/01/2016   Renal disorder    Sleep apnea     Allergies  Allergen Reactions   Azithromycin Hives   Benadryl [Diphenhydramine Hcl] Hives   Doxycycline Hyclate    Vancomycin Swelling   Adhesive [Tape] Rash    ROS General: Denies  fever, chills, night sweats, changes in weight, changes in appetite HEENT: Denies headaches, ear pain, changes in vision, rhinorrhea, sore throat CV: Denies CP, palpitations, SOB, orthopnea Pulm: Denies SOB, cough, wheezing GI: Denies abdominal pain, nausea, vomiting, diarrhea, constipation + history of internal hemorrhoids, history of rectal bleeding GU: Denies dysuria, hematuria, frequency, vaginal discharge Msk: Denies muscle cramps   + right shoulder pain, left upper back pain Neuro: Denies weakness +numbness, tingling, tremor Skin: Denies rashes, bruising Psych: Denies depression, anxiety, hallucinations    Objective:    Blood pressure 110/70, pulse 99, temperature 98.4 F (36.9 C), temperature source Oral, weight (!) 306 lb (138.8 kg), SpO2 97 %.   Gen. Pleasant, well-nourished, in no distress, normal affect   HEENT: [place]/AT, face symmetric, conjunctiva clear, no scleral icterus, PERRLA, EOMI, nares patent without drainage Lungs: no accessory muscle use, CTAB, no wheezes or rales Cardiovascular: RRR, no m/r/g, no peripheral edema Musculoskeletal: TTP of the L infraspinatus.  No TTP of cervical, thoracic, lumbar spine or paraspinal muscles.  Limited ROM of right shoulder to 90 degrees before causing pain.  TTP of anterior right shoulder.  No TTP of R long head biceps brachii. no deformities, no cyanosis or clubbing, normal tone Neuro:  A&Ox3, CN II-XII intact, normal gait.  Mild tremor noted holding phone in right hand. Skin:  Warm, no  lesions/ rash   Wt Readings from Last 3 Encounters:  03/20/21 (!) 306 lb (138.8 kg)  02/25/21 (!) 308 lb (139.7 kg)  05/21/20 (!) 306 lb (138.8 kg)    Lab Results  Component Value Date   WBC 10.4 04/29/2018   HGB 12.3 04/29/2018   HCT 36.4 04/29/2018   PLT 273 04/29/2018   GLUCOSE 110 (H) 04/29/2018   CHOL 166 04/18/2018   TRIG 334 (H) 04/18/2018   HDL 38 (L) 04/18/2018   LDLCALC 61 04/18/2018   ALT 9 04/29/2018   AST 10 (L) 04/29/2018    NA 136 04/29/2018   K 3.4 (L) 04/29/2018   CL 98 04/29/2018   CREATININE 6.18 (H) 04/29/2018   BUN 14 04/29/2018   CO2 23 04/29/2018   TSH 3.42 02/25/2021   HGBA1C 5.6 02/25/2021    Assessment/Plan:  Chronic right shoulder pain  -Discussed possible causes including arthritis/impingement, muscle strain -Supportive care including topical analgesics, stretching, heat, ice -We will obtain imaging given duration of symptoms -Ortho referral based on imaging - Plan: DG Shoulder Right  Neuropathy -Reviewed labs from 8/2.  Vitamin B12, A1c, thyroid normal -Discussed supportive care -Consider gabapentin -For continued or worsening symptoms neurology referral  Strain of left infraspinatus tendon, subsequent encounter -Improving -Supportive care  Internal hemorrhoids -s/p banding x3 -Discussed avoiding heavy lifting and preventing constipation - Plan: Ambulatory referral to Gastroenterology  Rectal bleeding -Stable -2/2 internal hemorrhoids - Plan: Ambulatory referral to Gastroenterology  ESRD (end stage renal disease) on dialysis ([place]) -Continue HD MWF -Renally dose medications and avoid nephrotoxic meds -Continue follow-up with nephrology  Tremor -Vitamin B12, thyroid normal on 02/25/2021 -Possibly related to shoulder pain -For continued or worsening symptoms neurology referral  F/u as needed in the next few weeks  Grier Mitts, MD

## 2021-03-21 ENCOUNTER — Other Ambulatory Visit: Payer: Self-pay

## 2021-03-21 DIAGNOSIS — G8929 Other chronic pain: Secondary | ICD-10-CM

## 2021-04-01 ENCOUNTER — Encounter (HOSPITAL_BASED_OUTPATIENT_CLINIC_OR_DEPARTMENT_OTHER): Payer: Self-pay | Admitting: Orthopaedic Surgery

## 2021-04-01 ENCOUNTER — Ambulatory Visit (INDEPENDENT_AMBULATORY_CARE_PROVIDER_SITE_OTHER): Payer: 59 | Admitting: Orthopaedic Surgery

## 2021-04-01 ENCOUNTER — Other Ambulatory Visit: Payer: Self-pay

## 2021-04-01 VITALS — BP 126/72 | Ht 67.0 in | Wt 304.0 lb

## 2021-04-01 DIAGNOSIS — M7501 Adhesive capsulitis of right shoulder: Secondary | ICD-10-CM | POA: Diagnosis not present

## 2021-04-01 NOTE — Progress Notes (Signed)
Chief Complaint: Right shoulder pain     History of Present Illness:   Pain Score: 8/10 SANE: 40/100  Cindy Luna is a 40 y.o. female with 2 months of atraumatic right shoulder pain.  She is currently on dialysis from end-stage renal disease as result of her lupus.  She states that she have any specific injury or incident to the right shoulder.  She has not noticed increasingly loss of motion and pain in the shoulder.  She is having significant difficulty feeling like she cannot sleep on the shoulder.  She states that she feels like it needs to pop.    Surgical History:   None  PMH/PSH/Family History/Social History/Meds/Allergies:    Past Medical History:  Diagnosis Date   Anemia    Depression    DM (diabetes mellitus) (HCC)    DUB (dysfunctional uterine bleeding) 06/24/2016   End stage renal disease (HCC)    HLD (hyperlipidemia)    HTN (hypertension)    Lupus (HCC)    Ovarian cyst 09/01/2016   Renal disorder    Sleep apnea    Past Surgical History:  Procedure Laterality Date   AV FISTULA PLACEMENT  2016 right, 2009 left   left and right are both working    CESAREAN SECTION     OTHER SURGICAL HISTORY Left    AV fistula stents, arm   TUBAL LIGATION  2010   Social History   Socioeconomic History   Marital status: Single    Spouse name: Not on file   Number of children: 1   Years of education: Not on file   Highest education level: Not on file  Occupational History   Not on file  Tobacco Use   Smoking status: Former    Types: Cigars    Start date: 2018    Quit date: 07/2018    Years since quitting: 2.6   Smokeless tobacco: Never   Tobacco comments:    1 black and mild cigar every day   Substance and Sexual Activity   Alcohol use: No   Drug use: No   Sexual activity: Yes    Partners: Male    Birth control/protection: None  Other Topics Concern   Not on file  Social History Narrative   ** Merged History Encounter  **       Level of education: college    Employment: unemployed    Transportation: car    Exercise: no   Housing situation: apt   Relationships (safe): yes   Contact for message (voicemail): [phone]   Social Determinants of Health   Financial Resource Strain: Low Risk    Difficulty of Paying Living Expenses: Not hard at all  Food Insecurity: No Food Insecurity   Worried About Charity fundraiser in the Last Year: Never true   Arboriculturist in the Last Year: Never true  Transportation Needs: No Transportation Needs   Lack of Transportation (Medical): No   Lack of Transportation (Non-Medical): No  Physical Activity: Inactive   Days of Exercise per Week: 0 days   Minutes of Exercise per Session: 0 min  Stress: No Stress Concern Present   Feeling of Stress : Not at all  Social Connections: Moderately Isolated   Frequency of Communication with Friends and Family: More than three times  a week   Frequency of Social Gatherings with Friends and Family: Once a week   Attends Religious Services: More than 4 times per year   Active Member of [employer] or Organizations: No   Attends Music therapist: Never   Marital Status: Never married   Family History  Problem Relation Age of Onset   Hypertension Mother    Diabetes Mother    Autism Daughter    Thyroid disease Sister    Colon cancer Maternal Grandmother    Prostate cancer Maternal Grandfather    Breast cancer Maternal Aunt    Diabetes Other        both sides of the fam   Hypertension Other        both sides of the fam   Allergies  Allergen Reactions   Azithromycin Hives   Benadryl [Diphenhydramine Hcl] Hives   Doxycycline Hyclate    Vancomycin Swelling   Adhesive [Tape] Rash   Current Outpatient Medications  Medication Sig Dispense Refill   cetirizine (ZYRTEC) 10 MG tablet Take 10 mg by mouth daily.     cinacalcet (SENSIPAR) 60 MG tablet Take 60 mg by mouth daily with supper.      cinacalcet (SENSIPAR)  90 MG tablet Take 90 mg by mouth daily.     hydrocortisone cream 0.5 % Apply 1 application topically 2 (two) times daily. 30 g 0   linaclotide (LINZESS) 145 MCG CAPS capsule Take 145 mcg by mouth as needed.     linaclotide (LINZESS) 72 MCG capsule Take 72 mcg by mouth daily before breakfast.     methocarbamol (ROBAXIN) 500 MG tablet Take 1 tablet (500 mg total) by mouth every 6 (six) hours as needed for muscle spasms. 60 tablet 0   norethindrone (AYGESTIN) 5 MG tablet Take 1 tablet by mouth daily.     pantoprazole (PROTONIX) 40 MG tablet Take 40 mg by mouth daily.  6   Semaglutide,0.25 or 0.'5MG'$ /DOS, (OZEMPIC, 0.25 OR 0.5 MG/DOSE,) 2 MG/1.5ML SOPN Inject into the skin.     sevelamer carbonate (RENVELA) 800 MG tablet Take 800 mg by mouth. Take 5 tablets three times daily with meal and 2 tablets two times with snack     topiramate (TOPAMAX) 100 MG tablet Take 1 tablet (100 mg total) by mouth at bedtime. 1 tablet 0   triamcinolone cream (KENALOG) 0.1 % Apply 1 application topically 2 (two) times daily. 30 g 0   No current facility-administered medications for this visit.   No results found.  Review of Systems:   A ROS was performed including pertinent positives and negatives as documented in the HPI.  Physical Exam :   Constitutional: NAD and appears stated age Neurological: Alert and oriented Psych: Appropriate affect and cooperative Blood pressure 126/72, height '5\' 7"'$  (1.702 m), weight (!) 304 lb (137.9 kg).   Comprehensive Musculoskeletal Exam:    Musculoskeletal Exam    Inspection Right Left  Skin No atrophy or winging No atrophy or winging  Palpation    Tenderness Glenohumeral joint None  Range of Motion    Flexion (passive) 100 170  Flexion (active) 100 180  Extension 30 30  Abduction 80 170  ER at the side    ER at 90 of abduction    IR at 90 of abduction    Can reach behind back to Sacrum L3  Strength     Limited due to pain Normal  Special Tests    Pseudoparalytic No  No  Neurologic  Fires PIN, radial, median, ulnar, musculocutaneous, axillary, suprascapular, long thoracic, and spinal accessory innervated muscles. No abnormal sensibility  Vascular/Lymphatic    Radial Pulse 2+ 2+  Cervical Exam    Patient has symmetric cervical range of motion with negative Spurling's test.  Special Test: Positive pain with passive external rotation     Imaging:   Xray (right shoulder 3 views normal): Normal   I personally reviewed and interpreted the radiographs.   Assessment:   40 year old female with right shoulder pain atraumatic for 2 months consistent with adhesive capsulitis.  I recommend that we perform an ultrasound-guided right shoulder injection today in order to help with the inflammation and hopefully break the cycle of freezing which she is currently in.  We will provide physical therapy as well although this should begin after 2 weeks following injection to give time for the injection to be more efficacious.  I will see her back in 4 weeks and we will assess the results of the injection with a possible plan for second injection if no improvement  Plan :    -Right shoulder ultrasound-guided glenohumeral injection provided today -Physical therapy provided she will begin this in 2 weeks following the injection -Return to clinic in 4 weeks     Procedure Note  Patient: Cindy Luna             Date of Birth: 01/09/1981           MRN: [id number]             Visit Date: 04/01/2021  Procedures: Visit Diagnoses: No diagnosis found.  Large Joint Inj on 04/01/2021 1:33 PM Indications: pain Details: 22 G 1.5 in needle, ultrasound-guided anterior approach  Arthrogram: No  Medications: 4 mL lidocaine 1 %; 80 mg triamcinolone acetonide 40 MG/ML Outcome: tolerated well, no immediate complications Procedure, treatment alternatives, risks and benefits explained, specific risks discussed. Consent was given by the patient. Immediately prior to procedure  a time out was called to verify the correct patient, procedure, equipment, support staff and site/side marked as required. Patient was prepped and draped in the usual sterile fashion.        I personally saw and evaluated the patient, and participated in the management and treatment plan.  Vanetta Mulders, MD Attending Physician, Orthopedic Surgery  This document was dictated using Dragon voice recognition software. A reasonable attempt at proof reading has been made to minimize errors.

## 2021-04-02 MED ORDER — TRIAMCINOLONE ACETONIDE 40 MG/ML IJ SUSP
80.0000 mg | INTRAMUSCULAR | Status: AC | PRN
  Administered 2021-04-01: 80 mg via INTRA_ARTICULAR

## 2021-04-02 MED ORDER — LIDOCAINE HCL 1 % IJ SOLN
4.0000 mL | INTRAMUSCULAR | Status: AC | PRN
  Administered 2021-04-01: 4 mL

## 2021-04-08 ENCOUNTER — Ambulatory Visit (HOSPITAL_BASED_OUTPATIENT_CLINIC_OR_DEPARTMENT_OTHER): Payer: 59 | Attending: Family Medicine | Admitting: Physical Therapy

## 2021-04-08 ENCOUNTER — Encounter (HOSPITAL_BASED_OUTPATIENT_CLINIC_OR_DEPARTMENT_OTHER): Payer: Self-pay | Admitting: Physical Therapy

## 2021-04-08 ENCOUNTER — Other Ambulatory Visit: Payer: Self-pay

## 2021-04-08 DIAGNOSIS — M25612 Stiffness of left shoulder, not elsewhere classified: Secondary | ICD-10-CM | POA: Diagnosis present

## 2021-04-08 DIAGNOSIS — M25611 Stiffness of right shoulder, not elsewhere classified: Secondary | ICD-10-CM | POA: Insufficient documentation

## 2021-04-08 DIAGNOSIS — M6281 Muscle weakness (generalized): Secondary | ICD-10-CM | POA: Diagnosis present

## 2021-04-08 NOTE — Therapy (Signed)
OUTPATIENT PHYSICAL THERAPY SHOULDER EVALUATION  Patient Name: Cindy Luna MRN: [id number] DOB:1981/02/23, 40 y.o., female Today's Date: 04/08/2021  PCP: Billie Ruddy, MD REFERRING PROVIDER: Vanetta Mulders, MD    PT End of Session - 04/08/21 1026     Visit Number 1    Number of Visits 17    Date for PT Re-Evaluation 07/07/21    Authorization Type UHC    PT Start Time [id number]    PT Stop Time 1100    PT Time Calculation (min) 45 min    Activity Tolerance Patient tolerated treatment well    Behavior During Therapy [facility] for tasks assessed/performed             Past Medical History:  Diagnosis Date   Anemia    Depression    DM (diabetes mellitus) ([place])    DUB (dysfunctional uterine bleeding) 06/24/2016   End stage renal disease ([place])    HLD (hyperlipidemia)    HTN (hypertension)    Lupus ([place])    Ovarian cyst 09/01/2016   Renal disorder    Sleep apnea    Past Surgical History:  Procedure Laterality Date   AV FISTULA PLACEMENT  2016 right, 2009 left   left and right are both working    [place] Left    AV fistula stents, arm   TUBAL LIGATION  2010   Patient Active Problem List   Diagnosis Date Noted   Tinea versicolor 11/16/2019   Chronic constipation 11/16/2019   Eczema 11/16/2019   Environmental and seasonal allergies 07/17/2019   History of depression 07/17/2019   Patient is Jehovah's Witness 07/13/2019   Chest tightness 01/04/2019   Dysmenorrhea 05/03/2018   Class 3 severe obesity with serious comorbidity and body mass index (BMI) of 45.0 to 49.9 in adult ([place]) 04/18/2018   Tobacco use disorder 04/18/2018   Lymphadenopathy of head and neck 09/29/2017   OSA (obstructive sleep apnea) 12/10/2014   ESRD (end stage renal disease) on dialysis ([place]) 11/19/2014   Lupus ([place]) 11/19/2014    ONSET DATE: 01/2021  REFERRING DIAG: M75.01 (ICD-10-CM) - Adhesive capsulitis of right shoulder   THERAPY DIAG:  Decreased right  shoulder range of motion  Muscle weakness (generalized)  Decreased range of motion of left shoulder  SUBJECTIVE:   SUBJECTIVE STATEMENT: She states this started happening about 2-2.5 months ago. She states that she did not have any specific injury or incident to the right shoulder that she recalls. She has not noticed increased loss of ROM and pain in the shoulder over time. She is having significant difficulty with sleeping on the R shoulder. She states that she "feels like it needs to pop." Pt states the shoulder feels stiff. Pt states she feels the ROM was limited and painful with BHB reaching, OH reaching, dressing, and ADL in general. She states she started using her L hand more and started having some L shoulder pain as well too.   Pt states it might be related to dialysis.   Pt reports that since the steroid injection, it started feeling a little better. She states she was able to get really good sleep the first night. Pt denies cancer red flags.  PERTINENT HISTORY: ESRD, depression (due to financial circumstances at the time)  PAIN:  Are you having pain? No  FALLS: Has patient fallen in last 6 months? No,    PLOF: Independent  PATIENT GOALS: Pt states she would like get back "100% as close as we can without pain."   OBJECTIVE:   DIAGNOSTIC FINDINGS:  Xray (right shoulder 3 views normal): Normal  PATIENT SURVEYS:  SPADI 82 / 130 = 63.1 %   UPPER EXTREMITY AROM/PROM:  AROM Right 04/08/2021 Left 04/08/2021  Shoulder flexion 135 WFL  Shoulder abduction 95 WFL  ADDuction Neutral        Shoulder internal rotation 58, IR BHB reach SIJ BHB WFL  Shoulder external rotation 65, ER BHB C6 BHB WFL  (Blank rows = not tested) PROM Right 04/08/2021 Left 04/08/2021  Shoulder flexion 140    Shoulder abduction 125   Shoulder adduction        Shoulder internal rotation 70   Shoulder external rotation 75   (Blank rows = not tested)  UPPER EXTREMITY MMT:  MMT Right 04/08/2021 Left 04/08/2021  Shoulder flexion 4/5 WFL  Shoulder abduction 4/5 WFL  Shoulder adduction    Shoulder External rotation 4/5 with pain WFL  Shoulder internal rotation 4/5 WFL      (Blank rows = not tested)  SHOULDER SPECIAL TESTS:  Not tested due to elevated pain with PROM  JOINT MOBILITY TESTING: stiffness in post and inf GHJ glides  PALPATION: R infra, biceps, deltoid, supra  POSTURE: kyphotic, rounded shoulders   TODAY'S TREATMENT:  R GHJ inf glide grade III Ut stretch 30s 3x Post cuff stretch 30s 3x   PATIENT EDUCATION: Education details: MOI, diagnosis, prognosis, anatomy, exercise progression, DOMS expectations, muscle firing, HEP, POC Person educated: Patient Education method: Explanation, Demonstration, Tactile cues, Verbal cues, and Handouts Education comprehension: verbalized understanding and returned demonstration   HOME EXERCISE PROGRAM: Access Code: [email or web address] URL: [email or web address] Date: 04/08/2021 Prepared by: Daleen Bo  Exercises Seated Scapular Retraction - 2 x daily - 7 x weekly - 2 sets - 10 reps Standing Shoulder External Rotation AAROM with [place] - 2 x daily - 7 x weekly - 1 sets - 10 reps - 5 hold Seated Upper Trapezius Stretch - 2 x daily - 7 x weekly - 1 sets - 3 reps - 30 hold   ASSESSMENT:  CLINICAL IMPRESSION: Patient is a 40 y.o. female who was seen today for physical therapy evaluation and treatment for CC of R shoulder pain. Objective impairments include decreased ROM, decreased strength, hypomobility, increased fascial restrictions, increased muscle spasms, impaired flexibility, impaired UE functional use, improper body mechanics, and postural dysfunction. Pt's s/s appear consistent with adhesive capsulitis, though severe stiffness  and capsular pattern restriction not present during testing. Potentially due to recent steroid injection. Pt's pain is most irritable with AROM. These impairments are limiting patient from cleaning, community activity, driving, occupation, laundry, and caregiver duties. Personal factors including Fitness and 1-2 comorbidities: lupus, depression, ESRD are also affecting patient's functional outcome. Patient will benefit from skilled PT to address above impairments and improve overall function.  REHAB POTENTIAL: Good  CLINICAL DECISION MAKING: Stable/uncomplicated  EVALUATION COMPLEXITY: Low   GOALS:  SHORT TERM GOALS:  STG Name Target Date Goal status  1 Pt will become independent with HEP in order to demonstrate synthesis of PT education.  04/22/2021  INITIAL  2 Pt will be able to demonstrate ability to reach wash hair without pain in order to demonstrate functional improvement  in UE function for self-care and house hold duties.  05/06/2021  INITIAL  3 Pt will be able to reach [facility] and grab/reach without pain in order to demonstrate functional improvement in R UE function.  05/06/2021 INITIAL  4 Pt will demonstrate at least a 15.4 pt improvement in SPADI in order to demonstrate a clinically significant change in R UE function.  05/06/2021 INITIAL   LONG TERM GOALS:   LTG Name Target Date Goal status  1 Pt will become independent with HEP in order to demonstrate synthesis of PT education.  06/03/2021  INITIAL  2 Pt  will become independent with final HEP in order to demonstrate synthesis of PT education.  06/03/2021 INITIAL  3 Pt will be able to reach [facility] and grab/reach/hold >5 lbs without pain in order to demonstrate functional improvement in L/R UE function.  06/03/2021 INITIAL  4 Pt will have an at least 30 pt improvement in SPADI measure in order to demonstrate MCID improvement in daily function 06/03/2021 INITIAL  5 Pt will be able to demonstrate full reach BHB in ER and IR in order  to demonstrate functional improvement in UE function for self-care and house hold duties. 06/03/2021 INITIAL   PLAN: PT FREQUENCY: 1-2x/week  PT DURATION: 8 weeks  PLANNED INTERVENTIONS: Therapeutic exercises, Therapeutic activity, Neuro Muscular re-education, Patient/Family education, Joint mobilization, Dry Needling, Electrical stimulation, Spinal mobilization, Cryotherapy, Moist heat, scar mobilization, Taping, Vasopneumatic device, Traction, Ultrasound, Ionotophoresis '4mg'$ /ml Dexamethasone, and Manual therapy  PLAN FOR NEXT SESSION: review HEP, AAROM, PROM, joint mobilization, isometrics for pain   Daleen Bo PT, DPT 04/08/21 12:10 PM

## 2021-04-22 ENCOUNTER — Encounter (HOSPITAL_BASED_OUTPATIENT_CLINIC_OR_DEPARTMENT_OTHER): Payer: 59 | Admitting: Physical Therapy

## 2021-04-22 ENCOUNTER — Encounter (HOSPITAL_BASED_OUTPATIENT_CLINIC_OR_DEPARTMENT_OTHER): Payer: Self-pay | Admitting: Physical Therapy

## 2021-04-22 ENCOUNTER — Ambulatory Visit (HOSPITAL_BASED_OUTPATIENT_CLINIC_OR_DEPARTMENT_OTHER): Payer: 59 | Admitting: Physical Therapy

## 2021-04-22 ENCOUNTER — Other Ambulatory Visit: Payer: Self-pay

## 2021-04-22 DIAGNOSIS — M6281 Muscle weakness (generalized): Secondary | ICD-10-CM

## 2021-04-22 DIAGNOSIS — M25611 Stiffness of right shoulder, not elsewhere classified: Secondary | ICD-10-CM

## 2021-04-22 DIAGNOSIS — M25612 Stiffness of left shoulder, not elsewhere classified: Secondary | ICD-10-CM

## 2021-04-22 NOTE — Therapy (Signed)
OUTPATIENT PHYSICAL THERAPY TREATMENT NOTE   Patient Name: Cindy Luna MRN: [id number] DOB:August 23, 1980, 40 y.o., female Today's Date: 04/22/2021  PCP: Billie Ruddy, MD REFERRING PROVIDER: Billie Ruddy, MD   PT End of Session - 04/22/21 1121     Visit Number 2    Number of Visits 17    Date for PT Re-Evaluation 07/07/21    Authorization Type UHC    PT Start Time [id number]    PT Stop Time 1151    PT Time Calculation (min) 40 min    Activity Tolerance Patient tolerated treatment well    Behavior During Therapy [facility] for tasks assessed/performed             Past Medical History:  Diagnosis Date   Anemia    Depression    DM (diabetes mellitus) ([place])    DUB (dysfunctional uterine bleeding) 06/24/2016   End stage renal disease ([place])    HLD (hyperlipidemia)    HTN (hypertension)    Lupus ([place])    Ovarian cyst 09/01/2016   Renal disorder    Sleep apnea    Past Surgical History:  Procedure Laterality Date   AV FISTULA PLACEMENT  2016 right, 2009 left   left and right are both working    [place] Left    AV fistula stents, arm   TUBAL LIGATION  2010   Patient Active Problem List   Diagnosis Date Noted   Tinea versicolor 11/16/2019   Chronic constipation 11/16/2019   Eczema 11/16/2019   Environmental and seasonal allergies 07/17/2019   History of depression 07/17/2019   Patient is Jehovah's Witness 07/13/2019   Chest tightness 01/04/2019   Dysmenorrhea 05/03/2018   Class 3 severe obesity with serious comorbidity and body mass index (BMI) of 45.0 to 49.9 in adult ([place]) 04/18/2018   Tobacco use disorder 04/18/2018   Lymphadenopathy of head and neck 09/29/2017   OSA (obstructive sleep apnea) 12/10/2014   ESRD (end stage renal disease) on dialysis ([place]) 11/19/2014   Lupus ([place]) 11/19/2014     ONSET DATE: 01/2021   REFERRING DIAG: M75.01 (ICD-10-CM) - Adhesive capsulitis of right shoulder    THERAPY DIAG:  Decreased right  shoulder range of motion   Muscle weakness (generalized)   Decreased range of motion of left shoulder   SUBJECTIVE:    SUBJECTIVE STATEMENT:   Pt reports improved sx's since receiving cortisone shot from MD.  Pt denies any adverse effects after prior Rx.  Pt reports compliance with HEP.  Pt denies pain currently and reports stiffness in shoulder.  Pt states she feels limited and painful with BHB reaching, OH reaching, dressing, and ADLs.  Pt reports she has difficulty with reaching arm overhead and has difficulty reaching for objects on a high shelf.  Pt c/o's of pain and difficulty with reaching behind back and trying to pull her pants up.  Her sleeping is on/off.  She has been taking Tylenol "muscle and pain" per pt at night which helps her sleep.  PERTINENT HISTORY: ESRD, depression (due to financial circumstances at the time)   PAIN:  Are you having pain? No     PATIENT GOALS: Pt states she would like get back "100% as close as we can without pain."    OBJECTIVE:   TODAY'S TREATMENT: -Therapeutic Exercise:  -Reviewed pain level, response to prior Rx, HEP compliance, and current function. -Reviewed and Updated HEP. -Pt performed: -seated scap retractions approx 12-15 reps -seated UT stretch 2x30 sec bilat -seated wand ER 2x10 reps -supine wand flexion 2x10 reps    -supine wand ER 2x10 reps   -Standing wand horiz add behind back 2x10 reps  -Pt received R shoulder PROM in flexion, scaption, Abd, ER, and IR per pt and tissue tolerance.          PATIENT EDUCATION: Education details: Reviewed and updated HEP.  Gave pt a HEP handout and educated pt in correct form and appropriate frequency.  Instructed pt to not perform into a painful range.   Person educated: Patient Education method:  Explanation, Demonstration, Tactile cues, Verbal cues, and Handouts Education comprehension: verbalized understanding and returned demonstration     HOME EXERCISE PROGRAM: Access Code: [email or web address] URL: [email or web address] Date: 04/22/2021 Prepared by: Ronny Flurry  Exercises  Supine Shoulder Wand flexion AAROM - 2 x daily - 7 x weekly - 2 sets - 10 reps      ASSESSMENT:   CLINICAL IMPRESSION: Pt presents to Rx reporting no pain.  She has been compliant with HEP.  Pt tolerated PROM well and demonstrates improved Abduction PROM based upon visual observation.  Pt performed there ex well with cuing and instruction in correct form.  Objective impairments include decreased ROM, decreased strength, hypomobility, increased fascial restrictions, increased muscle spasms, impaired flexibility, impaired UE functional use, improper body mechanics, and postural dysfunction.  Pt responded well to Rx having no increased pain and no c/o's after Rx.  Patient should benefit from skilled PT to address goals and to restore PLOF.       GOALS:   SHORT TERM GOALS:   STG Name Target Date Goal status  1 Pt will become independent with HEP in order to demonstrate synthesis of PT education.   04/22/2021   INITIAL  2 Pt will be able to demonstrate ability to reach wash hair without pain in order to demonstrate functional improvement in UE function for self-care and house hold duties.   05/06/2021   INITIAL  3 Pt will be able to reach [facility] and grab/reach without pain in order to demonstrate functional improvement in R UE function.   05/06/2021 INITIAL  4 Pt will demonstrate at least a 15.4 pt improvement in SPADI in order to demonstrate a clinically significant change in R UE function.   05/06/2021 INITIAL    LONG TERM GOALS:    LTG Name Target Date Goal status  1 Pt will become independent with HEP in order to demonstrate synthesis of PT education.   06/03/2021   INITIAL  2 Pt  will become  independent with final HEP in order to demonstrate synthesis of PT education.   06/03/2021 INITIAL  3 Pt will be able to reach [facility] and grab/reach/hold >5 lbs without pain in order to demonstrate functional improvement in L/R UE function.   06/03/2021 INITIAL  4 Pt will have an at least 30 pt improvement in SPADI measure in order to demonstrate MCID improvement in daily function 06/03/2021 INITIAL  5 Pt will be able to demonstrate full reach BHB in  ER and IR in order to demonstrate functional improvement in UE function for self-care and house hold duties. 06/03/2021 INITIAL    PLAN:    PLANNED INTERVENTIONS: Therapeutic exercises, Therapeutic activity, Neuro Muscular re-education, Patient/Family education, Joint mobilization, Dry Needling, Electrical stimulation, Spinal mobilization, Cryotherapy, Moist heat, scar mobilization, Taping, Vasopneumatic device, Traction, Ultrasound, Ionotophoresis '4mg'$ /ml Dexamethasone, and Manual therapy   PLAN FOR NEXT SESSION: review HEP.  Cont with progressing ROM.  isometrics for pain          Selinda Michaels III PT, DPT 04/22/21 10:38 PM     [facility] [address] [place], [place], [address] Phone: [phone]   Fax:  [phone]  Patient name: Cindy Luna MRN: [id number] DOB: 05-13-1981

## 2021-04-24 ENCOUNTER — Encounter (HOSPITAL_BASED_OUTPATIENT_CLINIC_OR_DEPARTMENT_OTHER): Payer: Self-pay | Admitting: Physical Therapy

## 2021-04-24 ENCOUNTER — Ambulatory Visit (HOSPITAL_BASED_OUTPATIENT_CLINIC_OR_DEPARTMENT_OTHER): Payer: 59 | Admitting: Physical Therapy

## 2021-04-24 ENCOUNTER — Other Ambulatory Visit: Payer: Self-pay

## 2021-04-24 DIAGNOSIS — M25612 Stiffness of left shoulder, not elsewhere classified: Secondary | ICD-10-CM

## 2021-04-24 DIAGNOSIS — M25611 Stiffness of right shoulder, not elsewhere classified: Secondary | ICD-10-CM | POA: Diagnosis not present

## 2021-04-24 DIAGNOSIS — M6281 Muscle weakness (generalized): Secondary | ICD-10-CM

## 2021-04-24 NOTE — Therapy (Signed)
OUTPATIENT PHYSICAL THERAPY TREATMENT NOTE   Patient Name: Cindy Luna MRN: [id number] DOB:04/07/1981, 40 y.o., female Today's Date: 04/24/2021  PCP: Billie Ruddy, MD REFERRING PROVIDER: Billie Ruddy, MD   PT End of Session - 04/24/21 1156     Visit Number 3    Number of Visits 17    Date for PT Re-Evaluation 07/07/21    Authorization Type UHC    PT Start Time 1150    PT Stop Time 1230    PT Time Calculation (min) 40 min    Activity Tolerance Patient tolerated treatment well    Behavior During Therapy [facility] for tasks assessed/performed              Past Medical History:  Diagnosis Date   Anemia    Depression    DM (diabetes mellitus) ([place])    DUB (dysfunctional uterine bleeding) 06/24/2016   End stage renal disease ([place])    HLD (hyperlipidemia)    HTN (hypertension)    Lupus ([place])    Ovarian cyst 09/01/2016   Renal disorder    Sleep apnea    Past Surgical History:  Procedure Laterality Date   AV FISTULA PLACEMENT  2016 right, 2009 left   left and right are both working    [place] Left    AV fistula stents, arm   TUBAL LIGATION  2010   Patient Active Problem List   Diagnosis Date Noted   Tinea versicolor 11/16/2019   Chronic constipation 11/16/2019   Eczema 11/16/2019   Environmental and seasonal allergies 07/17/2019   History of depression 07/17/2019   Patient is Jehovah's Witness 07/13/2019   Chest tightness 01/04/2019   Dysmenorrhea 05/03/2018   Class 3 severe obesity with serious comorbidity and body mass index (BMI) of 45.0 to 49.9 in adult ([place]) 04/18/2018   Tobacco use disorder 04/18/2018   Lymphadenopathy of head and neck 09/29/2017   OSA (obstructive sleep apnea) 12/10/2014   ESRD (end stage renal disease) on dialysis ([place]) 11/19/2014   Lupus ([place]) 11/19/2014     ONSET DATE: 01/2021   REFERRING DIAG: M75.01 (ICD-10-CM) - Adhesive capsulitis of right shoulder    THERAPY DIAG:  Decreased  right shoulder range of motion   Muscle weakness (generalized)   Decreased range of motion of left shoulder   SUBJECTIVE:    SUBJECTIVE STATEMENT:   Pt states that the shoulder feels like it is moving much better when going OH. She feels she is able to move more OH but in a gravity assisted position. She states that reaching behind the back is still very hard.  PERTINENT HISTORY: ESRD, depression (due to financial circumstances at the time)   PAIN:  Are you having pain? No     PATIENT GOALS: Pt states she would like get back "100% as close as we can without pain."    OBJECTIVE:   TODAY'S TREATMENT:   09/29 Exercise: -standing rowing 3x10 Green -seated UT stretch 2x30 sec bilat -seated wand ER 2x10 reps 3-5 s hold -supine wand flexion 3s 2x10 reps   -doorway pec stretch- arm straight low at 60 deg 30s 2x   -Standing wand horiz add behind back 2x10 reps 3-5s   Manual: R GHJ inf and post glides grade III          PATIENT EDUCATION: Education details: exercise progression, DOMS expectations, acceptable levels of pain,  envelope of function, HEP and set up Person educated: Patient Education method: Explanation, Demonstration, Tactile cues, Verbal cues, and Handouts Education comprehension: verbalized understanding and returned demonstration     HOME EXERCISE PROGRAM: Access Code: [email or web address] URL: [email or web address] Date: 04/24/2021 Prepared by: Daleen Bo  Exercises Standing Shoulder External Rotation AAROM with Dowel - 2 x daily - 7 x weekly - 1 sets - 10 reps - 5 hold Supine Shoulder Wand flexion AAROM - 2 x daily - 7 x weekly - 2 sets - 10 reps Doorway Pec Stretch at 60 Degrees Abduction with Arm Straight - 2 x daily - 7 x weekly - 1 sets - 3 reps - 30  hold Standing Shoulder Row with Anchored Resistance - 2 x daily - 7 x weekly - 2 sets - 10 reps        ASSESSMENT:   CLINICAL IMPRESSION: Pt able to improve shoulder flexion and ER ROM by end of session upon observation. Pt responded well to GHJ mobilizations, especially with inf shoulder mob. Pt continues to find anterior shoulder tightness so HEP updated to include gentle pec stretching. Pt was able to include resisted scapular retraction exercise without pain. Pt continues to have BHB reaching deficits due to stiffness and pain limitations.  Pt would benefit from continued skilled therapy in order to reach goals and maximize functional R UE strength and ROM for full return to PLOF.       GOALS:   SHORT TERM GOALS:   STG Name Target Date Goal status  1 Pt will become independent with HEP in order to demonstrate synthesis of PT education.   04/22/2021   ongoing  2 Pt will be able to demonstrate ability to reach wash hair without pain in order to demonstrate functional improvement in UE function for self-care and house hold duties.   05/06/2021   ongoing  3 Pt will be able to reach [facility] and grab/reach without pain in order to demonstrate functional improvement in R UE function.   05/06/2021 ongoing  4 Pt will demonstrate at least a 15.4 pt improvement in SPADI in order to demonstrate a clinically significant change in R UE function.   05/06/2021 ongoing    LONG TERM GOALS:    LTG Name Target Date Goal status  1 Pt will become independent with HEP in order to demonstrate synthesis of PT education.   06/03/2021   ongoing  2 Pt  will become independent with final HEP in order to demonstrate synthesis of PT education.   06/03/2021 ongoing  3 Pt will be able to reach [facility] and grab/reach/hold >5 lbs without pain in order to demonstrate functional improvement in L/R UE function.   06/03/2021 ongoing  4 Pt  will have an at least 30 pt improvement in SPADI measure in order to demonstrate MCID  improvement in daily function 06/03/2021 ongoing  5 Pt will be able to demonstrate full reach BHB in ER and IR in order to demonstrate functional improvement in UE function for self-care and house hold duties. 06/03/2021 ongoing    PLAN:    PLANNED INTERVENTIONS: Therapeutic exercises, Therapeutic activity, Neuro Muscular re-education, Patient/Family education, Joint mobilization, Dry Needling, Electrical stimulation, Spinal mobilization, Cryotherapy, Moist heat, scar mobilization, Taping, Vasopneumatic device, Traction, Ultrasound, Ionotophoresis '4mg'$ /ml Dexamethasone, and Manual therapy   PLAN FOR NEXT SESSION: review HEP. Cont with progressing ROM.  Table ER, IR strap stretch, table ABD          Daleen Bo PT, DPT 04/24/21 12:33 PM      [facility] [address] [place], [place], [address] Phone: [phone]   Fax:  [phone]  Patient name: Cindy Luna MRN: [id number] DOB: 12/05/1980

## 2021-04-29 ENCOUNTER — Ambulatory Visit (INDEPENDENT_AMBULATORY_CARE_PROVIDER_SITE_OTHER): Payer: 59 | Admitting: Orthopaedic Surgery

## 2021-04-29 ENCOUNTER — Ambulatory Visit (HOSPITAL_BASED_OUTPATIENT_CLINIC_OR_DEPARTMENT_OTHER): Payer: 59 | Attending: Family Medicine | Admitting: Physical Therapy

## 2021-04-29 ENCOUNTER — Other Ambulatory Visit: Payer: Self-pay

## 2021-04-29 ENCOUNTER — Encounter (HOSPITAL_BASED_OUTPATIENT_CLINIC_OR_DEPARTMENT_OTHER): Payer: Self-pay | Admitting: Physical Therapy

## 2021-04-29 DIAGNOSIS — M7501 Adhesive capsulitis of right shoulder: Secondary | ICD-10-CM

## 2021-04-29 DIAGNOSIS — M6281 Muscle weakness (generalized): Secondary | ICD-10-CM | POA: Insufficient documentation

## 2021-04-29 DIAGNOSIS — M25612 Stiffness of left shoulder, not elsewhere classified: Secondary | ICD-10-CM | POA: Diagnosis present

## 2021-04-29 DIAGNOSIS — M25611 Stiffness of right shoulder, not elsewhere classified: Secondary | ICD-10-CM | POA: Insufficient documentation

## 2021-04-29 MED ORDER — TRIAMCINOLONE ACETONIDE 40 MG/ML IJ SUSP
80.0000 mg | INTRAMUSCULAR | Status: AC | PRN
  Administered 2021-04-29: 80 mg via INTRA_ARTICULAR

## 2021-04-29 MED ORDER — LIDOCAINE HCL 1 % IJ SOLN
4.0000 mL | INTRAMUSCULAR | Status: AC | PRN
  Administered 2021-04-29: 4 mL

## 2021-04-29 NOTE — Therapy (Signed)
OUTPATIENT PHYSICAL THERAPY TREATMENT NOTE   Patient Name: PETAL GENIER MRN: [id number] DOB:[birth year], 40 y.o., female Today's Date: 04/29/2021  PCP: Billie Ruddy, MD REFERRING PROVIDER: Billie Ruddy, MD   PT End of Session - 04/29/21 1113     Visit Number 4    Number of Visits 17    Date for PT Re-Evaluation 07/07/21    Authorization Type UHC    PT Start Time 1100    PT Stop Time 1140    PT Time Calculation (min) 40 min    Activity Tolerance Patient tolerated treatment well    Behavior During Therapy [facility] for tasks assessed/performed               Past Medical History:  Diagnosis Date   Anemia    Depression    DM (diabetes mellitus) ([place])    DUB (dysfunctional uterine bleeding) 06/24/2016   End stage renal disease ([place])    HLD (hyperlipidemia)    HTN (hypertension)    Lupus ([place])    Ovarian cyst 09/01/2016   Renal disorder    Sleep apnea    Past Surgical History:  Procedure Laterality Date   AV FISTULA PLACEMENT  2016 right, 2009 left   left and right are both working    [place] Left    AV fistula stents, arm   TUBAL LIGATION  2010   Patient Active Problem List   Diagnosis Date Noted   Tinea versicolor 11/16/2019   Chronic constipation 11/16/2019   Eczema 11/16/2019   Environmental and seasonal allergies 07/17/2019   History of depression 07/17/2019   Patient is Jehovah's Witness 07/13/2019   Chest tightness 01/04/2019   Dysmenorrhea 05/03/2018   Class 3 severe obesity with serious comorbidity and body mass index (BMI) of 45.0 to 49.9 in adult ([place]) 04/18/2018   Tobacco use disorder 04/18/2018   Lymphadenopathy of head and neck 09/29/2017   OSA (obstructive sleep apnea) 12/10/2014   ESRD (end stage renal disease) on dialysis ([place]) 11/19/2014   Lupus ([place]) 11/19/2014     ONSET DATE: 01/2021   REFERRING DIAG: M75.01 (ICD-10-CM) - Adhesive capsulitis of right shoulder    THERAPY DIAG:  Decreased  right shoulder range of motion   Muscle weakness (generalized)   Decreased range of motion of left shoulder   SUBJECTIVE:    SUBJECTIVE STATEMENT:   Pt states the shoulder continues to do better. She does not have the pain currently due to new steroid injection. She states she will see him again in 3 weeks. She states she did have some soreness in the front of the shoulder after last session.  PERTINENT HISTORY: ESRD, depression (due to financial circumstances at the time, no longer)   PAIN:  Are you having pain? No     PATIENT GOALS: Pt states she would like get back "100% as close as we can without pain."    OBJECTIVE:   IR BHB reach to L3 OH flexion to 160 deg  TODAY'S TREATMENT: 10/4 Exercise: -standing rowing 3x10 Green -pulleys scaption 3 min 3s hold -seated wand ER 2x10 reps 3-5 s hold (diagonal) -supine wand flexion 5s 2x10 reps   -doorway pec stretch- arm straight low at 60 deg 30s 2x   -Standing wand horiz add behind back 2x10 reps 3-5s   -IR strap stretch 10s 10x   Manual: R GHJ inf and post glides grade III , LAD and shoulder clocks grade III    09/29 Exercise: -standing rowing 3x10 Green -seated UT stretch 2x30 sec bilat -seated wand ER 2x10 reps 3-5 s hold -supine wand flexion 3s 2x10 reps   -doorway pec stretch- arm straight low at 60 deg 30s 2x   -Standing wand horiz add behind back 2x10 reps 3-5s   Manual: R GHJ inf and post glides grade III          PATIENT EDUCATION: Education details: exercise progression, acceptable levels of pain,  envelope of function, HEP  compliance and set up at home Person educated: Patient Education method: Explanation, Demonstration, Tactile cues, Verbal cues, and Handouts Education comprehension: verbalized understanding and  returned demonstration     HOME EXERCISE PROGRAM: Access Code: [email or web address] URL: [email or web address] Date: 04/24/2021 Prepared by: Daleen Bo  Exercises Standing Shoulder External Rotation AAROM with Dowel - 2 x daily - 7 x weekly - 1 sets - 10 reps - 5 hold Supine Shoulder Wand flexion AAROM - 2 x daily - 7 x weekly - 2 sets - 10 reps Doorway Pec Stretch at 60 Degrees Abduction with Arm Straight - 2 x daily - 7 x weekly - 1 sets - 3 reps - 30 hold Standing Shoulder Row with Anchored Resistance - 2 x daily - 7 x weekly - 2 sets - 10 reps        ASSESSMENT:   CLINICAL IMPRESSION: Pt continues to show improvement in ROM following 2nd injection. Pt able to progress and improve OH reaching to within 25 deg of unaffected side. Pt still with stiffness into IR BHB but without pain today, still limited in AROM vs AA/PROM. HEP not updated due to pt report of mixed compliance. Plan to progress ER and IR in open pack position as able. Pt would benefit from continued skilled therapy in order to reach goals and maximize functional R UE strength and ROM for full return to PLOF.       GOALS:   SHORT TERM GOALS:   STG Name Target Date Goal status  1 Pt will become independent with HEP in order to demonstrate synthesis of PT education.   04/22/2021   ongoing  2 Pt will be able to demonstrate ability to reach wash hair without pain in order to demonstrate functional improvement in UE function for self-care and house hold duties.   05/06/2021   ongoing  3 Pt will be able to reach [facility] and grab/reach without pain in order to demonstrate functional improvement in R UE function.   05/06/2021 ongoing  4 Pt will demonstrate at least a 15.4 pt improvement in SPADI in order to demonstrate a clinically significant change in R UE function.   05/06/2021 ongoing  LONG TERM GOALS:    LTG Name Target Date Goal status  1 Pt will become independent with HEP in order to demonstrate synthesis of  PT education.   06/03/2021   ongoing  2 Pt  will become independent with final HEP in order to demonstrate synthesis of PT education.   06/03/2021 ongoing  3 Pt will be able to reach [facility] and grab/reach/hold >5 lbs without pain in order to demonstrate functional improvement in L/R UE function.   06/03/2021 ongoing  4 Pt will have an at least 30 pt improvement in SPADI measure in order to demonstrate MCID improvement in daily function 06/03/2021 ongoing  5 Pt will be able to demonstrate full reach BHB in ER and IR in order to demonstrate functional improvement in UE function for self-care and house hold duties. 06/03/2021 ongoing    PLAN:    PLANNED INTERVENTIONS: Therapeutic exercises, Therapeutic activity, Neuro Muscular re-education, Patient/Family education, Joint mobilization, Dry Needling, Electrical stimulation, Spinal mobilization, Cryotherapy, Moist heat, scar mobilization, Taping, Vasopneumatic device, Traction, Ultrasound, Ionotophoresis '4mg'$ /ml Dexamethasone, and Manual therapy   PLAN FOR NEXT SESSION: review HEP. Cont with progressing ROM.  Table ER, table ABD, add in strap IR to HEP if tolerated well          Daleen Bo PT, DPT 04/29/21 11:36 AM       Patient name: Cindy Luna MRN: [id number] DOB: June 16, 1981

## 2021-04-29 NOTE — Progress Notes (Signed)
Chief Complaint: Right shoulder pain     History of Present Illness:   04/29/21: Overall she feels significantly improved after her steroid injection on April 01, 2021.  She does state that she has some residual pain with going behind the back.  Overall sleeping is much better.  She has pain with lifting weight overhead.  Cindy Luna is a 40 y.o. female with 2 months of atraumatic right shoulder pain.  She is currently on dialysis from end-stage renal disease as result of her lupus.  She states that she have any specific injury or incident to the right shoulder.  She has not noticed increasingly loss of motion and pain in the shoulder.  She is having significant difficulty feeling like she cannot sleep on the shoulder.  She states that she feels like it needs to pop.    Surgical History:   None  PMH/PSH/Family History/Social History/Meds/Allergies:    Past Medical History:  Diagnosis Date  . Anemia   . Depression   . DM (diabetes mellitus) ([place])   . DUB (dysfunctional uterine bleeding) 06/24/2016  . End stage renal disease ([place])   . HLD (hyperlipidemia)   . HTN (hypertension)   . Lupus ([place])   . Ovarian cyst 09/01/2016  . Renal disorder   . Sleep apnea    Past Surgical History:  Procedure Laterality Date  . AV FISTULA PLACEMENT  2016 right, 2009 left   left and right are both working   . CESAREAN SECTION    . OTHER SURGICAL HISTORY Left    AV fistula stents, arm  . TUBAL LIGATION  2010   Social History   Socioeconomic History  . Marital status: Single    Spouse name: Not on file  . Number of children: 1  . Years of education: Not on file  . Highest education level: Not on file  Occupational History  . Not on file  Tobacco Use  . Smoking status: Former    Types: Cigars    Start date: 2018    Quit date: 07/2018    Years since quitting: 2.7  . Smokeless tobacco: Never  . Tobacco comments:    1 black and mild cigar every  day   Substance and Sexual Activity  . Alcohol use: No  . Drug use: No  . Sexual activity: Yes    Partners: Male    Birth control/protection: None  Other Topics Concern  . Not on file  Social History Narrative   ** Merged History Encounter **       Level of education: college    Employment: unemployed    Transportation: car    Exercise: no   Housing situation: apt   Relationships (safe): yes   Contact for message (voicemail): [phone]   Social Determinants of Health   Financial Resource Strain: Low Risk   . Difficulty of Paying Living Expenses: Not hard at all  Food Insecurity: No Food Insecurity  . Worried About Charity fundraiser in the Last Year: Never true  . Ran Out of Food in the Last Year: Never true  Transportation Needs: No Transportation Needs  . Lack of Transportation (Medical): No  . Lack of Transportation (Non-Medical): No  Physical Activity: Inactive  . Days of Exercise per Week: 0 days  . Minutes of  Exercise per Session: 0 min  Stress: No Stress Concern Present  . Feeling of Stress : Not at all  Social Connections: Moderately Isolated  . Frequency of Communication with Friends and Family: More than three times a week  . Frequency of Social Gatherings with Friends and Family: Once a week  . Attends Religious Services: More than 4 times per year  . Active Member of Clubs or Organizations: No  . Attends Archivist Meetings: Never  . Marital Status: Never married   Family History  Problem Relation Age of Onset  . Hypertension Mother   . Diabetes Mother   . Autism Daughter   . Thyroid disease Sister   . Colon cancer Maternal Grandmother   . Prostate cancer Maternal Grandfather   . Breast cancer Maternal Aunt   . Diabetes Other        both sides of the fam  . Hypertension Other        both sides of the fam   Allergies  Allergen Reactions  . Azithromycin Hives  . Benadryl [Diphenhydramine Hcl] Hives  . Doxycycline Hyclate   .  Vancomycin Swelling  . Adhesive [Tape] Rash   Current Outpatient Medications  Medication Sig Dispense Refill  . cetirizine (ZYRTEC) 10 MG tablet Take 10 mg by mouth daily.    . cinacalcet (SENSIPAR) 60 MG tablet Take 60 mg by mouth daily with supper.  (Patient not taking: Reported on 04/08/2021)    . cinacalcet (SENSIPAR) 90 MG tablet Take 90 mg by mouth daily.    . hydrocortisone cream 0.5 % Apply 1 application topically 2 (two) times daily. (Patient not taking: Reported on 04/08/2021) 30 g 0  . linaclotide (LINZESS) 145 MCG CAPS capsule Take 145 mcg by mouth as needed. (Patient not taking: Reported on 04/08/2021)    . linaclotide (LINZESS) 72 MCG capsule Take 72 mcg by mouth daily before breakfast.    . methocarbamol (ROBAXIN) 500 MG tablet Take 1 tablet (500 mg total) by mouth every 6 (six) hours as needed for muscle spasms. (Patient not taking: Reported on 04/08/2021) 60 tablet 0  . norethindrone (AYGESTIN) 5 MG tablet Take 1 tablet by mouth daily.    . pantoprazole (PROTONIX) 40 MG tablet Take 40 mg by mouth daily.  6  . Semaglutide,0.25 or 0.'5MG'$ /DOS, (OZEMPIC, 0.25 OR 0.5 MG/DOSE,) 2 MG/1.5ML SOPN Inject into the skin.    Marland Kitchen sevelamer carbonate (RENVELA) 800 MG tablet Take 800 mg by mouth. Take 5 tablets three times daily with meal and 2 tablets two times with snack    . topiramate (TOPAMAX) 100 MG tablet Take 1 tablet (100 mg total) by mouth at bedtime. (Patient taking differently: Take 200 mg by mouth at bedtime.) 1 tablet 0  . triamcinolone cream (KENALOG) 0.1 % Apply 1 application topically 2 (two) times daily. 30 g 0   No current facility-administered medications for this visit.   No results found.  Review of Systems:   A ROS was performed including pertinent positives and negatives as documented in the HPI.  Physical Exam :   Constitutional: NAD and appears stated age Neurological: Alert and oriented Psych: Appropriate affect and cooperative There were no vitals taken for this  visit.   Comprehensive Musculoskeletal Exam:    Musculoskeletal Exam    Inspection Right Left  Skin No atrophy or winging No atrophy or winging  Palpation    Tenderness Glenohumeral joint None  Range of Motion    Flexion (passive) 170 170  Flexion (active) 160 160  Extension 30 30  Abduction 170 170  ER at the side 70 70  ER at 90 of abduction    IR at 90 of abduction    Can reach behind back to Sacrum L3  Strength     Limited due to pain Normal  Special Tests    Pseudoparalytic No No  Neurologic    Fires PIN, radial, median, ulnar, musculocutaneous, axillary, suprascapular, long thoracic, and spinal accessory innervated muscles. No abnormal sensibility  Vascular/Lymphatic    Radial Pulse 2+ 2+  Cervical Exam    Patient has symmetric cervical range of motion with negative Spurling's test.  Special Test: Positive pain with passive external rotation     Imaging:   Xray (right shoulder 3 views normal): Normal   I personally reviewed and interpreted the radiographs.   Assessment:   40 year old female with right shoulder pain atraumatic for 2 months consistent with adhesive capsulitis.  She did experience improvement with the right shoulder ultrasound-guided injection on April 01, 2021.  She has been working on a home exercise program for range of motion of the right shoulder.  I specifically advised her on some range of motion exercises to improve internal rotation.  I would like to perform an additional ultrasound-guided right steroid injection today to help improve with her motion and pain  Plan :    -Right shoulder ultrasound-guided glenohumeral injection provided today -Return to clinic in 2 weeks for I personally saw and evaluated the patient, and participated in the management and treatment plan.     Procedure Note  Patient: Cindy Luna             Date of Birth: 26-Jun-1981           MRN: [id number]             Visit Date:  04/29/2021  Procedures: Visit Diagnoses: No diagnosis found.  Large Joint Inj: R glenohumeral on 04/29/2021 10:48 AM Indications: pain Details: 22 G 1.5 in needle, ultrasound-guided anterior approach  Arthrogram: No  Medications: 4 mL lidocaine 1 %; 80 mg triamcinolone acetonide 40 MG/ML Outcome: tolerated well, no immediate complications Procedure, treatment alternatives, risks and benefits explained, specific risks discussed. Consent was given by the patient. Immediately prior to procedure a time out was called to verify the correct patient, procedure, equipment, support staff and site/side marked as required. Patient was prepped and draped in the usual sterile fashion.       Vanetta Mulders, MD Attending Physician, Orthopedic Surgery  This document was dictated using Dragon voice recognition software. A reasonable attempt at proof reading has been made to minimize errors.

## 2021-04-29 NOTE — Addendum Note (Signed)
Addended by: Yevonne Pax on: 04/29/2021 11:53 AM   Modules accepted: Orders

## 2021-05-06 ENCOUNTER — Encounter (HOSPITAL_BASED_OUTPATIENT_CLINIC_OR_DEPARTMENT_OTHER): Payer: 59 | Admitting: Physical Therapy

## 2021-05-06 NOTE — Addendum Note (Signed)
Addended by: Yevonne Pax on: 05/06/2021 10:40 AM   Modules accepted: Orders

## 2021-05-08 ENCOUNTER — Encounter (HOSPITAL_BASED_OUTPATIENT_CLINIC_OR_DEPARTMENT_OTHER): Payer: 59 | Admitting: Physical Therapy

## 2021-05-15 ENCOUNTER — Ambulatory Visit (HOSPITAL_BASED_OUTPATIENT_CLINIC_OR_DEPARTMENT_OTHER): Payer: 59 | Admitting: Physical Therapy

## 2021-05-15 ENCOUNTER — Encounter (HOSPITAL_BASED_OUTPATIENT_CLINIC_OR_DEPARTMENT_OTHER): Payer: Self-pay | Admitting: Physical Therapy

## 2021-05-15 ENCOUNTER — Other Ambulatory Visit: Payer: Self-pay

## 2021-05-15 DIAGNOSIS — M6281 Muscle weakness (generalized): Secondary | ICD-10-CM

## 2021-05-15 DIAGNOSIS — M25611 Stiffness of right shoulder, not elsewhere classified: Secondary | ICD-10-CM | POA: Diagnosis not present

## 2021-05-15 DIAGNOSIS — M25612 Stiffness of left shoulder, not elsewhere classified: Secondary | ICD-10-CM

## 2021-05-15 NOTE — Therapy (Signed)
OUTPATIENT PHYSICAL THERAPY TREATMENT NOTE   Patient Name: Cindy Luna MRN: [id number] DOB:11-12-1980, 40 y.o., female Today's Date: 05/15/2021  PCP: Billie Ruddy, MD REFERRING PROVIDER: Billie Ruddy, MD   PT End of Session - 05/15/21 1236     Visit Number 5    Number of Visits 17    Date for PT Re-Evaluation 07/07/21    Authorization Type UHC    PT Start Time 1150    PT Stop Time 1230    PT Time Calculation (min) 40 min    Activity Tolerance Patient tolerated treatment well    Behavior During Therapy WFL for tasks assessed/performed                Past Medical History:  Diagnosis Date   Anemia    Depression    DM (diabetes mellitus) ([place])    DUB (dysfunctional uterine bleeding) 06/24/2016   End stage renal disease ([place])    HLD (hyperlipidemia)    HTN (hypertension)    Lupus ([place])    Ovarian cyst 09/01/2016   Renal disorder    Sleep apnea    Past Surgical History:  Procedure Laterality Date   AV FISTULA PLACEMENT  2016 right, 2009 left   left and right are both working    [place] Left    AV fistula stents, arm   TUBAL LIGATION  2010   Patient Active Problem List   Diagnosis Date Noted   Tinea versicolor 11/16/2019   Chronic constipation 11/16/2019   Eczema 11/16/2019   Environmental and seasonal allergies 07/17/2019   History of depression 07/17/2019   Patient is Jehovah's Witness 07/13/2019   Chest tightness 01/04/2019   Dysmenorrhea 05/03/2018   Class 3 severe obesity with serious comorbidity and body mass index (BMI) of 45.0 to 49.9 in adult ([place]) 04/18/2018   Tobacco use disorder 04/18/2018   Lymphadenopathy of head and neck 09/29/2017   OSA (obstructive sleep apnea) 12/10/2014   ESRD (end stage renal disease) on dialysis ([place]) 11/19/2014   Lupus ([place]) 11/19/2014     ONSET DATE: 01/2021   REFERRING DIAG: M75.01 (ICD-10-CM) - Adhesive capsulitis of right shoulder    THERAPY DIAG:  Decreased  right shoulder range of motion   Muscle weakness (generalized)   Decreased range of motion of left shoulder   SUBJECTIVE:    SUBJECTIVE STATEMENT:   Pt states the shoulder pain is off and on. She will have moments of increased pain up to a 6/10 and then moments of no pain. There is no pattern. She states it could be weather related. Pt states she has no complaints with HEP. Sometimes will have L shoulder pain too.  PERTINENT HISTORY: ESRD, depression (due to financial circumstances at the time, no longer)   PAIN:  PAIN:  Are you having pain? Yes VAS scale: 4/10 Pain location: R shoulder/ across upper back Pain orientation: Right  PAIN TYPE: aching Pain description: intermittent     PATIENT GOALS: Pt states she would like get back "100% as close as we can without pain."    OBJECTIVE:   IR BHB reach to L3 OH flexion to 152 deg  TODAY'S TREATMENT:  10/20 Exercise: -standing rowing 3x10 Green -pulleys scaption 3 min 3s hold -standing wand ER 2x10 reps 3-5 s hold (diagonal) -seated ER Table 10s 10x -L levator stretch30s 2x   -doorway pec stretch- arm straight low at 60 deg 30s 2x   -IR strap stretch 10s 10x      Manual: R GHJ inf and post glides grade IV , LAD and shoulder clocks grade III 10/4 Exercise: -standing rowing 3x10 Green -pulleys scaption 3 min 3s hold -seated wand ER 2x10 reps 3-5 s hold (diagonal) -supine wand flexion 5s 2x10 reps   -doorway pec stretch- arm straight low at 60 deg 30s 2x   -Standing wand horiz add behind back 2x10 reps 3-5s   -IR strap stretch 10s 10x   Manual: R GHJ inf and post glides grade III , LAD and shoulder clocks grade III    09/29 Exercise: -standing rowing 3x10 Green -seated UT stretch 2x30 sec bilat -seated wand ER 2x10 reps 3-5 s hold -supine wand flexion 3s 2x10  reps   -doorway pec stretch- arm straight low at 60 deg 30s 2x   -Standing wand horiz add behind back 2x10 reps 3-5s   Manual: R GHJ inf and post glides grade III          PATIENT EDUCATION: Education details: exercise progression, acceptable levels of pain,  envelope of function, HEP update & compliance, set up at home Person educated: Patient Education method: Explanation, Demonstration, Tactile cues, Verbal cues, and Handouts Education comprehension: verbalized understanding and returned demonstration     HOME EXERCISE PROGRAM: Access Code: [email or web address] URL: [email or web address] Date: 05/15/2021 Prepared by: Daleen Bo  Exercises Supine Shoulder Wand flexion AAROM - 2 x daily - 7 x weekly - 2 sets - 10 reps Doorway Pec Stretch at 60 Degrees Abduction with Arm Straight - 2 x daily - 7 x weekly - 1 sets - 3 reps - 30 hold Seated Shoulder External Rotation PROM on Table - 2 x daily - 7 x weekly - 1 sets - 10 reps - 10 hold Standing Shoulder Internal Rotation Stretch with Towel - 2 x daily - 7 x weekly - 1 sets - 10 reps - 15s hold Standing Shoulder Row with Anchored Resistance - 1 x daily - 7 x weekly - 3 sets - 10 reps         ASSESSMENT:   CLINICAL IMPRESSION: Pt presents today with increased R shoulder stiffness at beginning of today's session with increased anterior shoulder and pec region stiffness. Pt able to reach up to 153 in flexion following manual and exercise vs starting at 122 at beginning of session. AROM near symmetrical but R still stiff with movement. Pt with good tolerance to IR and ER stretching progression. HEP updated since pt reports compliance and continuation of previous exercise. Pt would benefit from continued skilled therapy in order to reach goals and maximize functional R UE strength and ROM for full return to PLOF.       GOALS:  SHORT TERM GOALS:   STG Name Target Date Goal status  1 Pt will become independent with HEP in order  to demonstrate synthesis of PT education.   04/22/2021   ongoing  2 Pt will be able to demonstrate ability to reach wash hair without pain in order to demonstrate functional improvement in UE function for self-care and house hold duties.   05/06/2021   ongoing  3 Pt will be able to reach [facility] and grab/reach without pain in order to demonstrate functional improvement in R UE function.   05/06/2021 ongoing  4 Pt will demonstrate at least a 15.4 pt improvement in SPADI in order to demonstrate a clinically significant change in R UE function.   05/06/2021 ongoing    LONG TERM GOALS:    LTG Name Target Date Goal status  1 Pt will become independent with HEP in order to demonstrate synthesis of PT education.   06/03/2021   ongoing  2 Pt  will become independent with final HEP in order to demonstrate synthesis of PT education.   06/03/2021 ongoing  3 Pt will be able to reach [facility] and grab/reach/hold >5 lbs without pain in order to demonstrate functional improvement in L/R UE function.   06/03/2021 ongoing  4 Pt will have an at least 30 pt improvement in SPADI measure in order to demonstrate MCID improvement in daily function 06/03/2021 ongoing  5 Pt will be able to demonstrate full reach BHB in ER and IR in order to demonstrate functional improvement in UE function for self-care and house hold duties. 06/03/2021 ongoing    PLAN:    PLANNED INTERVENTIONS: Therapeutic exercises, Therapeutic activity, Neuro Muscular re-education, Patient/Family education, Joint mobilization, Dry Needling, Electrical stimulation, Spinal mobilization, Cryotherapy, Moist heat, scar mobilization, Taping, Vasopneumatic device, Traction, Ultrasound, Ionotophoresis '4mg'$ /ml Dexamethasone, and Manual therapy   PLAN FOR NEXT SESSION: review HEP. Cont with progressing ROM. table ABD, 90/90 pec stretch at doorway, S/L ER          Daleen Bo PT, DPT 05/15/21 1:00 PM       Patient name: Cindy Luna MRN: [id number] DOB:  03-21-1981

## 2021-05-20 ENCOUNTER — Other Ambulatory Visit: Payer: Self-pay

## 2021-05-20 ENCOUNTER — Ambulatory Visit (INDEPENDENT_AMBULATORY_CARE_PROVIDER_SITE_OTHER): Payer: 59 | Admitting: Orthopaedic Surgery

## 2021-05-20 DIAGNOSIS — M7501 Adhesive capsulitis of right shoulder: Secondary | ICD-10-CM | POA: Diagnosis not present

## 2021-05-20 NOTE — Progress Notes (Signed)
Chief Complaint: Right shoulder pain     History of Present Illness:   05/20/21: Alysen presents 2 weeks after a second right shoulder glenohumeral injection. She feels that the shoulder is approximately 97% better compared to the left shoulder and is overall very happy with the shoulder. She does endorse some upper trapezial pain on the left.  MAZI SCHUFF is a 40 y.o. female with 2 months of atraumatic right shoulder pain.  She is currently on dialysis from end-stage renal disease as result of her lupus.  She states that she have any specific injury or incident to the right shoulder.  She has not noticed increasingly loss of motion and pain in the shoulder.  She is having significant difficulty feeling like she cannot sleep on the shoulder.  She states that she feels like it needs to pop.    Surgical History:   None  PMH/PSH/Family History/Social History/Meds/Allergies:    Past Medical History:  Diagnosis Date   Anemia    Depression    DM (diabetes mellitus) ([facility])    DUB (dysfunctional uterine bleeding) 06/24/2016   End stage renal disease ([facility])    HLD (hyperlipidemia)    HTN (hypertension)    Lupus ([facility])    Ovarian cyst 09/01/2016   Renal disorder    Sleep apnea    Past Surgical History:  Procedure Laterality Date   AV FISTULA PLACEMENT  2016 right, 2009 left   left and right are both working    CESAREAN SECTION     OTHER SURGICAL HISTORY Left    AV fistula stents, arm   TUBAL LIGATION  2010   Social History   Socioeconomic History   Marital status: Single    Spouse name: Not on file   Number of children: 1   Years of education: Not on file   Highest education level: Not on file  Occupational History   Not on file  Tobacco Use   Smoking status: Former    Types: Cigars    Start date: 2018    Quit date: 07/2018    Years since quitting: 2.8   Smokeless tobacco: Never   Tobacco comments:    1 black and mild cigar every day    Substance and Sexual Activity   Alcohol use: No   Drug use: No   Sexual activity: Yes    Partners: Male    Birth control/protection: None  Other Topics Concern   Not on file  Social History Narrative   ** Merged History Encounter **       Level of education: college    Employment: unemployed    Transportation: car    Exercise: no   Housing situation: apt   Relationships (safe): yes   Contact for message (voicemail): [phone]   Social Determinants of Health   Financial Resource Strain: Low Risk    Difficulty of Paying Living Expenses: Not hard at all  Food Insecurity: No Food Insecurity   Worried About Charity fundraiser in the Last Year: Never true   Arboriculturist in the Last Year: Never true  Transportation Needs: No Transportation Needs   Lack of Transportation (Medical): No   Lack of Transportation (Non-Medical): No  Physical Activity: Inactive   Days of Exercise per Week: 0 days   Minutes  of Exercise per Session: 0 min  Stress: No Stress Concern Present   Feeling of Stress : Not at all  Social Connections: Moderately Isolated   Frequency of Communication with Friends and Family: More than three times a week   Frequency of Social Gatherings with Friends and Family: Once a week   Attends Religious Services: More than 4 times per year   Active Member of [employer] or Organizations: No   Attends Music therapist: Never   Marital Status: Never married   Family History  Problem Relation Age of Onset   Hypertension Mother    Diabetes Mother    Autism Daughter    Thyroid disease Sister    Colon cancer Maternal Grandmother    Prostate cancer Maternal Grandfather    Breast cancer Maternal Aunt    Diabetes Other        both sides of the fam   Hypertension Other        both sides of the fam   Allergies  Allergen Reactions   Azithromycin Hives   Benadryl [Diphenhydramine Hcl] Hives   Doxycycline Hyclate    Vancomycin Swelling   Adhesive [Tape]  Rash   Current Outpatient Medications  Medication Sig Dispense Refill   cetirizine (ZYRTEC) 10 MG tablet Take 10 mg by mouth daily.     cinacalcet (SENSIPAR) 60 MG tablet Take 60 mg by mouth daily with supper.  (Patient not taking: Reported on 04/08/2021)     cinacalcet (SENSIPAR) 90 MG tablet Take 90 mg by mouth daily.     hydrocortisone cream 0.5 % Apply 1 application topically 2 (two) times daily. (Patient not taking: Reported on 04/08/2021) 30 g 0   linaclotide (LINZESS) 145 MCG CAPS capsule Take 145 mcg by mouth as needed. (Patient not taking: Reported on 04/08/2021)     linaclotide (LINZESS) 72 MCG capsule Take 72 mcg by mouth daily before breakfast.     methocarbamol (ROBAXIN) 500 MG tablet Take 1 tablet (500 mg total) by mouth every 6 (six) hours as needed for muscle spasms. (Patient not taking: Reported on 04/08/2021) 60 tablet 0   norethindrone (AYGESTIN) 5 MG tablet Take 1 tablet by mouth daily.     pantoprazole (PROTONIX) 40 MG tablet Take 40 mg by mouth daily.  6   Semaglutide,0.25 or 0.5MG /DOS, (OZEMPIC, 0.25 OR 0.5 MG/DOSE,) 2 MG/1.5ML SOPN Inject into the skin.     sevelamer carbonate (RENVELA) 800 MG tablet Take 800 mg by mouth. Take 5 tablets three times daily with meal and 2 tablets two times with snack     topiramate (TOPAMAX) 100 MG tablet Take 1 tablet (100 mg total) by mouth at bedtime. (Patient taking differently: Take 200 mg by mouth at bedtime.) 1 tablet 0   triamcinolone cream (KENALOG) 0.1 % Apply 1 application topically 2 (two) times daily. 30 g 0   No current facility-administered medications for this visit.   No results found.  Review of Systems:   A ROS was performed including pertinent positives and negatives as documented in the HPI.  Physical Exam :   Constitutional: NAD and appears stated age Neurological: Alert and oriented Psych: Appropriate affect and cooperative There were no vitals taken for this visit.   Comprehensive Musculoskeletal Exam:     Musculoskeletal Exam    Inspection Right Left  Skin No atrophy or winging No atrophy or winging  Palpation    Tenderness Glenohumeral joint None  Range of Motion    Flexion (passive) 170 170  Flexion (active) 160 160  Extension 30 30  Abduction 170 170  ER at the side 70 70  ER at 90 of abduction    IR at 90 of abduction    Can reach behind back to L3 L3  Strength     Limited due to pain Normal  Special Tests    Pseudoparalytic No No  Neurologic    Fires PIN, radial, median, ulnar, musculocutaneous, axillary, suprascapular, long thoracic, and spinal accessory innervated muscles. No abnormal sensibility  Vascular/Lymphatic    Radial Pulse 2+ 2+  Cervical Exam    Patient has symmetric cervical range of motion with negative Spurling's test.  Special Test: Positive pain with passive external rotation     Imaging:   Xray (right shoulder 3 views normal): Normal   I personally reviewed and interpreted the radiographs.   Assessment:   40 year old female with right shoulder pain atraumatic for 2 months consistent with adhesive capsulitis.  ROM is now equal to the contralateral side and she is doing quite well. She may continue to work on gentle motion exercises with PT. I do believe she would be a candidate for dry needling as well on the trapezius.  Plan :    -She will followup prn    Vanetta Mulders, MD Attending Physician, Orthopedic Surgery  This document was dictated using Dragon voice recognition software. A reasonable attempt at proof reading has been made to minimize errors.

## 2021-05-27 ENCOUNTER — Encounter (HOSPITAL_BASED_OUTPATIENT_CLINIC_OR_DEPARTMENT_OTHER): Payer: Self-pay | Admitting: Physical Therapy

## 2021-05-27 ENCOUNTER — Ambulatory Visit (HOSPITAL_BASED_OUTPATIENT_CLINIC_OR_DEPARTMENT_OTHER): Payer: 59 | Attending: Family Medicine | Admitting: Physical Therapy

## 2021-05-27 ENCOUNTER — Other Ambulatory Visit: Payer: Self-pay

## 2021-05-27 DIAGNOSIS — M25612 Stiffness of left shoulder, not elsewhere classified: Secondary | ICD-10-CM | POA: Insufficient documentation

## 2021-05-27 DIAGNOSIS — M25611 Stiffness of right shoulder, not elsewhere classified: Secondary | ICD-10-CM | POA: Diagnosis present

## 2021-05-27 DIAGNOSIS — M6281 Muscle weakness (generalized): Secondary | ICD-10-CM | POA: Diagnosis present

## 2021-05-27 NOTE — Therapy (Signed)
OUTPATIENT PHYSICAL THERAPY TREATMENT NOTE   Patient Name: Cindy Luna MRN: [id number] DOB:1980-12-30, 40 y.o., female Today's Date: 05/27/2021  PCP: Billie Ruddy, MD REFERRING PROVIDER: Billie Ruddy, MD   PT End of Session - 05/27/21 1301     Visit Number 6    Number of Visits 17    Date for PT Re-Evaluation 07/07/21    Authorization Type UHC    PT Start Time 1300    PT Stop Time 1340    PT Time Calculation (min) 40 min    Activity Tolerance Patient tolerated treatment well    Behavior During Therapy WFL for tasks assessed/performed                 Past Medical History:  Diagnosis Date   Anemia    Depression    DM (diabetes mellitus) ([place])    DUB (dysfunctional uterine bleeding) 06/24/2016   End stage renal disease ([place])    HLD (hyperlipidemia)    HTN (hypertension)    Lupus ([place])    Ovarian cyst 09/01/2016   Renal disorder    Sleep apnea    Past Surgical History:  Procedure Laterality Date   AV FISTULA PLACEMENT  2016 right, 2009 left   left and right are both working    [place] Left    AV fistula stents, arm   TUBAL LIGATION  2010   Patient Active Problem List   Diagnosis Date Noted   Tinea versicolor 11/16/2019   Chronic constipation 11/16/2019   Eczema 11/16/2019   Environmental and seasonal allergies 07/17/2019   History of depression 07/17/2019   Patient is Jehovah's Witness 07/13/2019   Chest tightness 01/04/2019   Dysmenorrhea 05/03/2018   Class 3 severe obesity with serious comorbidity and body mass index (BMI) of 45.0 to 49.9 in adult ([place]) 04/18/2018   Tobacco use disorder 04/18/2018   Lymphadenopathy of head and neck 09/29/2017   OSA (obstructive sleep apnea) 12/10/2014   ESRD (end stage renal disease) on dialysis ([place]) 11/19/2014   Lupus ([place]) 11/19/2014     ONSET DATE: 01/2021   REFERRING DIAG: M75.01 (ICD-10-CM) - Adhesive capsulitis of right shoulder    THERAPY DIAG:   Decreased right shoulder range of motion   Muscle weakness (generalized)   Decreased range of motion of left shoulder   SUBJECTIVE:    SUBJECTIVE STATEMENT:   Pt states she will still have some slight pain into the R shoulder with driving with her R hand up.                                                                                         PERTINENT HISTORY: ESRD, depression (due to financial circumstances at the time, no longer)   PAIN:  PAIN:  Are you having pain? Yes VAS scale: 3/10 Pain location: R shoulder/ across upper back Pain orientation: Right  PAIN TYPE: aching Pain description: intermittent     PATIENT GOALS: Pt states she would like get back "100% as close as we can without pain."    OBJECTIVE:   OH flexion symmetrical bilaterally, ABD  appears symmetrical   TODAY'S TREATMENT:  11/1  Exercise: -table ABD 10s 10x -bilat shoulder ER RTB 2x10 -seated ER Table 10s 10x -L levator stretch30s 2x bilat -doorway pec stretch- arm straight low at 60 deg 30s 2x    Manual: R shoulder ant shoulder, pec, biceps; L UT, levators  10/20 Exercise: -standing rowing 3x10 Green -pulleys scaption 3 min 3s hold -standing wand ER 2x10 reps 3-5 s hold (diagonal) -seated ER Table 10s 10x -L levator stretch30s 2x   -doorway pec stretch- arm straight low at 60 deg 30s 2x   -IR strap stretch 10s 10x      Manual: R GHJ inf and post glides grade IV , LAD and shoulder clocks grade III 10/4 Exercise: -standing rowing 3x10 Green -pulleys scaption 3 min 3s hold -seated wand ER 2x10 reps 3-5 s hold (diagonal) -supine wand flexion 5s 2x10 reps   -doorway pec stretch- arm straight low at 60 deg 30s 2x   -Standing wand horiz add behind back 2x10 reps 3-5s   -IR strap stretch 10s 10x   Manual: R GHJ inf and post glides grade III , LAD and shoulder clocks grade III         PATIENT EDUCATION: Education details: exercise progression, acceptable levels of pain,  envelope of  function, HEP update & compliance, set up at home Person educated: Patient Education method: Explanation, Demonstration, Tactile cues, Verbal cues, and Handouts Education comprehension: verbalized understanding and returned demonstration     HOME EXERCISE PROGRAM: Access Code: [email or web address] URL: [email or web address] Date: 05/15/2021 Prepared by: Daleen Bo  Exercises Supine Shoulder Wand flexion AAROM - 2 x daily - 7 x weekly - 2 sets - 10 reps Doorway Pec Stretch at 60 Degrees Abduction with Arm Straight - 2 x daily - 7 x weekly - 1 sets - 3 reps - 30 hold Seated Shoulder External Rotation PROM on Table - 2 x daily - 7 x weekly - 1 sets - 10 reps - 10 hold Standing Shoulder Internal Rotation Stretch with Towel - 2 x daily - 7 x weekly - 1 sets - 10 reps - 15s hold Standing Shoulder Row with Anchored Resistance - 1 x daily - 7 x weekly - 3 sets - 10 reps         ASSESSMENT:   CLINICAL IMPRESSION: Pt presents today with increased R shoulder stiffness at beginning of today's session as well as L shoulder pain. Pt responded well to STM on L UT, though pain on R was minimally reduced following manual. Pt was able to increase ER loading at today's session as well as introduce CKC ABD. Pt given edu for DN per MD recc, but pt elects to continue with traditional manual therapy but is open to the idea down the road if it is absolutely needed. Plan to continue with ROM as tolerated and begin slow strength progression as ROM appears well maintained. Pt would benefit from continued skilled therapy in order to reach goals and maximize functional R UE strength and ROM for full return to PLOF.       GOALS:   SHORT TERM GOALS:   STG Name Target Date Goal status  1 Pt will become independent with HEP in order to demonstrate synthesis of PT education.   04/22/2021   ongoing  2 Pt will be able to demonstrate ability to reach wash hair without pain in order to demonstrate functional  improvement in UE function for self-care and house hold duties.   05/06/2021  ongoing  3 Pt will be able to reach [facility] and grab/reach without pain in order to demonstrate functional improvement in R UE function.   05/06/2021 ongoing  4 Pt will demonstrate at least a 15.4 pt improvement in SPADI in order to demonstrate a clinically significant change in R UE function.   05/06/2021 ongoing    LONG TERM GOALS:    LTG Name Target Date Goal status  1 Pt will become independent with HEP in order to demonstrate synthesis of PT education.   06/03/2021   ongoing  2 Pt  will become independent with final HEP in order to demonstrate synthesis of PT education.   06/03/2021 ongoing  3 Pt will be able to reach [facility] and grab/reach/hold >5 lbs without pain in order to demonstrate functional improvement in L/R UE function.   06/03/2021 ongoing  4 Pt will have an at least 30 pt improvement in SPADI measure in order to demonstrate MCID improvement in daily function 06/03/2021 ongoing  5 Pt will be able to demonstrate full reach BHB in ER and IR in order to demonstrate functional improvement in UE function for self-care and house hold duties. 06/03/2021 ongoing    PLAN:    PLANNED INTERVENTIONS: Therapeutic exercises, Therapeutic activity, Neuro Muscular re-education, Patient/Family education, Joint mobilization, Dry Needling, Electrical stimulation, Spinal mobilization, Cryotherapy, Moist heat, scar mobilization, Taping, Vasopneumatic device, Traction, Ultrasound, Ionotophoresis 4mg /ml Dexamethasone, and Manual therapy   PLAN FOR NEXT SESSION:Cont with progressing ROM. table ABD, DN PRN, S/L ER, horizontal ABD          Daleen Bo PT, DPT 05/27/21 1:46 PM       Patient name: Cindy Luna MRN: [id number] DOB: Jan 15, 1981

## 2021-06-05 ENCOUNTER — Ambulatory Visit (HOSPITAL_BASED_OUTPATIENT_CLINIC_OR_DEPARTMENT_OTHER): Payer: 59 | Admitting: Physical Therapy

## 2021-06-05 ENCOUNTER — Other Ambulatory Visit: Payer: Self-pay

## 2021-06-05 ENCOUNTER — Encounter (HOSPITAL_BASED_OUTPATIENT_CLINIC_OR_DEPARTMENT_OTHER): Payer: Self-pay | Admitting: Physical Therapy

## 2021-06-05 DIAGNOSIS — M25611 Stiffness of right shoulder, not elsewhere classified: Secondary | ICD-10-CM

## 2021-06-05 DIAGNOSIS — M6281 Muscle weakness (generalized): Secondary | ICD-10-CM

## 2021-06-05 DIAGNOSIS — M25612 Stiffness of left shoulder, not elsewhere classified: Secondary | ICD-10-CM

## 2021-06-05 NOTE — Therapy (Addendum)
OUTPATIENT PHYSICAL THERAPY TREATMENT NOTE  PHYSICAL THERAPY DISCHARGE SUMMARY  Visits from Start of Care: 7  Plan: Patient agrees to discharge.  Patient goals were not met. Patient is being discharged due to not returning to therapy.        Patient Name: Cindy Luna MRN: [id number] DOB:June 19, 1981, 40 y.o., female Today's Date: 06/05/2021  PCP: Billie Ruddy, MD REFERRING PROVIDER: Billie Ruddy, MD   PT End of Session - 06/05/21 1334     Visit Number 7    Number of Visits 17    Date for PT Re-Evaluation 07/07/21    Authorization Type [employer]    PT Start Time 1300    PT Stop Time 1340    PT Time Calculation (min) 40 min    Activity Tolerance Patient tolerated treatment well    Behavior During Therapy [facility] for tasks assessed/performed                  Past Medical History:  Diagnosis Date   Anemia    Depression    DM (diabetes mellitus) ([place])    DUB (dysfunctional uterine bleeding) 06/24/2016   End stage renal disease ([place])    HLD (hyperlipidemia)    HTN (hypertension)    Lupus ([place])    Ovarian cyst 09/01/2016   Renal disorder    Sleep apnea    Past Surgical History:  Procedure Laterality Date   AV FISTULA PLACEMENT  2016 right, 2009 left   left and right are both working    [place] Left    AV fistula stents, arm   TUBAL LIGATION  2010   Patient Active Problem List   Diagnosis Date Noted   Tinea versicolor 11/16/2019   Chronic constipation 11/16/2019   Eczema 11/16/2019   Environmental and seasonal allergies 07/17/2019   History of depression 07/17/2019   Patient is Jehovah's Witness 07/13/2019   Chest tightness 01/04/2019   Dysmenorrhea 05/03/2018   Class 3 severe obesity with serious comorbidity and body mass index (BMI) of 45.0 to 49.9 in adult ([place]) 04/18/2018   Tobacco use disorder 04/18/2018   Lymphadenopathy of head and neck 09/29/2017   OSA (obstructive sleep apnea) 12/10/2014   ESRD (end  stage renal disease) on dialysis ([place]) 11/19/2014   Lupus ([place]) 11/19/2014     ONSET DATE: 01/2021   REFERRING DIAG: M75.01 (ICD-10-CM) - Adhesive capsulitis of right shoulder    THERAPY DIAG:  Decreased right shoulder range of motion   Muscle weakness (generalized)   Decreased range of motion of left shoulder   SUBJECTIVE:    SUBJECTIVE STATEMENT:   Pt states she continues to have R shoulder pain while sitting still or keeping it in the same place. Pt states she noticed it the most while sitting still for her hai appointment and during dialysis. She states it felt like a deep throbbing pain. Pt denies NT.                                                                                    PERTINENT HISTORY: ESRD, depression (due to financial circumstances at the time, no longer)  PAIN:  PAIN:  Are you having pain? Yes VAS scale: 2/10 Pain location: R shoulder/ across upper back Pain orientation: Right  PAIN TYPE: aching Pain description: intermittent     PATIENT GOALS: Pt states she would like get back "100% as close as we can without pain."    OBJECTIVE:   OH flexion symmetrical bilaterally, ABD symmetrical   TODAY'S TREATMENT:  11/10  Exercise:  -UBE L1.5 fwd and retro 72mn each  -S/L ER 2lbs bilat ER 2x10 -table ABD 10s 10x -bilat shoulder ER RTB 2x10 -wall push up 3x10 3s hold -seated scap depression 3s 15x (too difficult to coordinate) -L levator stretch30s 2x bilat -RTB diagonals 5x through -doorway pec stretch- arm straight low at 60 deg 30s 2x bilat - 2lb DP scaption bilat 15x     11/1  Exercise: -table ABD 10s 10x -bilat shoulder ER RTB 2x10 -seated ER Table 10s 10x -L levator stretch30s 2x bilat -doorway pec stretch- arm straight low at 60 deg 30s 2x    Manual: R shoulder ant shoulder, pec, biceps; L UT, levators  10/20 Exercise: -standing rowing 3x10 Green -pulleys scaption 3 min 3s hold -standing wand ER 2x10 reps 3-5 s hold  (diagonal) -seated ER Table 10s 10x -L levator stretch30s 2x   -doorway pec stretch- arm straight low at 60 deg 30s 2x   -IR strap stretch 10s 10x      Manual: R GHJ inf and post glides grade IV , LAD and shoulder clocks grade III          PATIENT EDUCATION: Education details: exercise progression, acceptable levels of pain,  envelope of function, HEP update & compliance, set up at home Person educated: Patient Education method: Explanation, Demonstration, Tactile cues, Verbal cues, and Handouts Education comprehension: verbalized understanding and returned demonstration     HOME EXERCISE PROGRAM: Access Code: [email or web address] URL: [email or web address] Date: 05/15/2021 Prepared by: ADaleen Bo Exercises Supine Shoulder Wand flexion AAROM - 2 x daily - 7 x weekly - 2 sets - 10 reps Doorway Pec Stretch at 60 Degrees Abduction with Arm Straight - 2 x daily - 7 x weekly - 1 sets - 3 reps - 30 hold Seated Shoulder External Rotation PROM on Table - 2 x daily - 7 x weekly - 1 sets - 10 reps - 10 hold Standing Shoulder Internal Rotation Stretch with Towel - 2 x daily - 7 x weekly - 1 sets - 10 reps - 15s hold Standing Shoulder Row with Anchored Resistance - 1 x daily - 7 x weekly - 3 sets - 10 reps         ASSESSMENT:   CLINICAL IMPRESSION: Pt able to progress to more bilateral shoulder strengthening at this session. Due to pt report of pain with shoulder elevated position during dialysis and driving, pain appears related to muscle overuse and spasm type pain. Pt able to tolerate OKC and CKC strengthening exercise without complaint. However, pt did have trouble with coordinating scapular depression due to decreased scapular motor control. HEP updated accordingly. Pt to schedule with decreased frequency and slowly transition towards independent management of strength and pain. Pt would benefit from continued skilled therapy in order to reach goals and maximize functional R UE  strength and ROM for full return to PLOF.       GOALS:   SHORT TERM GOALS:   STG Name Target Date Goal status  1 Pt will become independent with HEP in order to demonstrate synthesis of PT education.  04/22/2021   ongoing  2 Pt will be able to demonstrate ability to reach wash hair without pain in order to demonstrate functional improvement in UE function for self-care and house hold duties.   05/06/2021   ongoing  3 Pt will be able to reach [facility] and grab/reach without pain in order to demonstrate functional improvement in R UE function.   05/06/2021 ongoing  4 Pt will demonstrate at least a 15.4 pt improvement in SPADI in order to demonstrate a clinically significant change in R UE function.   05/06/2021 ongoing    LONG TERM GOALS:    LTG Name Target Date Goal status  1 Pt will become independent with HEP in order to demonstrate synthesis of PT education.   06/03/2021   ongoing  2 Pt  will become independent with final HEP in order to demonstrate synthesis of PT education.   06/03/2021 ongoing  3 Pt will be able to reach [facility] and grab/reach/hold >5 lbs without pain in order to demonstrate functional improvement in L/R UE function.   06/03/2021 ongoing  4 Pt will have an at least 30 pt improvement in SPADI measure in order to demonstrate MCID improvement in daily function 06/03/2021 ongoing  5 Pt will be able to demonstrate full reach BHB in ER and IR in order to demonstrate functional improvement in UE function for self-care and house hold duties. 06/03/2021 ongoing    PLAN:    PLANNED INTERVENTIONS: Therapeutic exercises, Therapeutic activity, Neuro Muscular re-education, Patient/Family education, Joint mobilization, Dry Needling, Electrical stimulation, Spinal mobilization, Cryotherapy, Moist heat, scar mobilization, Taping, Vasopneumatic device, Traction, Ultrasound, Ionotophoresis 56m/ml Dexamethasone, and Manual therapy   PLAN FOR NEXT SESSION:Cont with progressing posterior  shoulder strength, add in OHP, re-check ROM, progress note          ADaleen BoPT, DPT 06/05/21 1:35 PM       Patient name: Cindy HOLECEKMRN: [id number]: [birth year]

## 2021-06-10 ENCOUNTER — Encounter (HOSPITAL_BASED_OUTPATIENT_CLINIC_OR_DEPARTMENT_OTHER): Payer: Self-pay | Admitting: Physical Therapy

## 2021-06-16 ENCOUNTER — Other Ambulatory Visit (HOSPITAL_COMMUNITY): Payer: Self-pay

## 2021-06-16 MED ORDER — OZEMPIC (1 MG/DOSE) 4 MG/3ML ~~LOC~~ SOPN
PEN_INJECTOR | SUBCUTANEOUS | 4 refills | Status: DC
  Filled 2021-06-16: qty 3, 28d supply, fill #0
  Filled 2021-07-24: qty 3, 28d supply, fill #1

## 2021-07-01 ENCOUNTER — Encounter (HOSPITAL_BASED_OUTPATIENT_CLINIC_OR_DEPARTMENT_OTHER): Payer: 59 | Admitting: Physical Therapy

## 2021-07-24 ENCOUNTER — Other Ambulatory Visit (HOSPITAL_COMMUNITY): Payer: Self-pay

## 2021-07-31 ENCOUNTER — Ambulatory Visit (INDEPENDENT_AMBULATORY_CARE_PROVIDER_SITE_OTHER): Payer: 59

## 2021-07-31 VITALS — Ht 67.0 in | Wt 304.0 lb

## 2021-07-31 DIAGNOSIS — Z0000 Encounter for general adult medical examination without abnormal findings: Secondary | ICD-10-CM | POA: Diagnosis not present

## 2021-07-31 NOTE — Progress Notes (Signed)
Subjective:   Cindy Luna is a 41 y.o. female who presents for Medicare Annual (Subsequent) preventive examination.  Review of Systems    No ROS     Objective:    Today's Vitals   07/31/21 0947  Weight: (!) 304 lb (137.9 kg)  Height: 5\' 7"  (1.702 m)   Body mass index is 47.61 kg/m.  Advanced Directives 07/31/2021 04/08/2021 07/30/2020 04/29/2018 04/22/2018 04/18/2018 11/27/2015  Does Patient Have a Medical Advance Directive? No No No No No No No  Would patient like information on creating a medical advance directive? No - Patient declined No - Patient declined No - Patient declined - No - Patient declined No - Patient declined No - patient declined information    Current Medications (verified) Outpatient Encounter Medications as of 07/31/2021  Medication Sig   docusate sodium (COLACE) 100 MG capsule Take 100 mg by mouth 2 (two) times daily.   lactobacillus acidophilus (BACID) TABS tablet Take 2 tablets by mouth 3 (three) times daily.   cetirizine (ZYRTEC) 10 MG tablet Take 10 mg by mouth daily.   cinacalcet (SENSIPAR) 60 MG tablet Take 60 mg by mouth daily with supper.  (Patient not taking: Reported on 04/08/2021)   cinacalcet (SENSIPAR) 90 MG tablet Take 90 mg by mouth daily.   hydrocortisone cream 0.5 % Apply 1 application topically 2 (two) times daily. (Patient not taking: Reported on 04/08/2021)   linaclotide (LINZESS) 145 MCG CAPS capsule Take 145 mcg by mouth as needed. (Patient not taking: Reported on 04/08/2021)   linaclotide (LINZESS) 72 MCG capsule Take 72 mcg by mouth daily before breakfast.   methocarbamol (ROBAXIN) 500 MG tablet Take 1 tablet (500 mg total) by mouth every 6 (six) hours as needed for muscle spasms. (Patient not taking: Reported on 04/08/2021)   norethindrone (AYGESTIN) 5 MG tablet Take 1 tablet by mouth daily.   pantoprazole (PROTONIX) 40 MG tablet Take 40 mg by mouth daily.   Semaglutide, 1 MG/DOSE, (OZEMPIC, 1 MG/DOSE,) 4 MG/3ML SOPN Inject 1 mg into the skin  once a week   Semaglutide,0.25 or 0.5MG /DOS, (OZEMPIC, 0.25 OR 0.5 MG/DOSE,) 2 MG/1.5ML SOPN Inject into the skin.   sevelamer carbonate (RENVELA) 800 MG tablet Take 800 mg by mouth. Take 5 tablets three times daily with meal and 2 tablets two times with snack   topiramate (TOPAMAX) 100 MG tablet Take 1 tablet (100 mg total) by mouth at bedtime. (Patient taking differently: Take 200 mg by mouth at bedtime.)   triamcinolone cream (KENALOG) 0.1 % Apply 1 application topically 2 (two) times daily.   No facility-administered encounter medications on file as of 07/31/2021.    Allergies (verified) Azithromycin, Benadryl [diphenhydramine hcl], Doxycycline hyclate, Vancomycin, and Adhesive [tape]   History: Past Medical History:  Diagnosis Date   Anemia    Depression    DM (diabetes mellitus) ([place])    DUB (dysfunctional uterine bleeding) 06/24/2016   End stage renal disease ([facility])    HLD (hyperlipidemia)    HTN (hypertension)    Lupus ([facility])    Ovarian cyst 09/01/2016   Renal disorder    Sleep apnea    Past Surgical History:  Procedure Laterality Date   AV FISTULA PLACEMENT  2016 right, 2009 left   left and right are both working    [place] Left    AV fistula stents, arm   TUBAL LIGATION  2010   Family History  Problem Relation Age of Onset  Hypertension Mother    Diabetes Mother    Autism Daughter    Thyroid disease Sister    Colon cancer Maternal Grandmother    Prostate cancer Maternal Grandfather    Breast cancer Maternal Aunt    Diabetes Other        both sides of the fam   Hypertension Other        both sides of the fam   Social History   Socioeconomic History   Marital status: Single    Spouse name: Not on file   Number of children: 1   Years of education: Not on file   Highest education level: Not on file  Occupational History   Not on file  Tobacco Use   Smoking status: Former    Types: Cigars    Start date: 2018    Quit  date: 07/2018    Years since quitting: 3.0   Smokeless tobacco: Never  Substance and Sexual Activity   Alcohol use: No   Drug use: No   Sexual activity: Yes    Partners: Male    Birth control/protection: None  Other Topics Concern   Not on file  Social History Narrative   ** Merged History Encounter **       Level of education: college    Employment: unemployed    Transportation: car    Exercise: no   Housing situation: apt   Relationships (safe): yes   Contact for message (voicemail): [phone]   Social Determinants of Health   Financial Resource Strain: Low Risk    Difficulty of Paying Living Expenses: Not hard at all  Food Insecurity: No Food Insecurity   Worried About Charity fundraiser in the Last Year: Never true   Arboriculturist in the Last Year: Never true  Transportation Needs: No Transportation Needs   Lack of Transportation (Medical): No   Lack of Transportation (Non-Medical): No  Physical Activity: Insufficiently Active   Days of Exercise per Week: 1 day   Minutes of Exercise per Session: 30 min  Stress: No Stress Concern Present   Feeling of Stress : Not at all  Social Connections: Moderately Integrated   Frequency of Communication with Friends and Family: More than three times a week   Frequency of Social Gatherings with Friends and Family: More than three times a week   Attends Religious Services: More than 4 times per year   Active Member of [employer] or Organizations: Yes   Attends Music therapist: More than 4 times per year   Marital Status: Never married    Tobacco Counseling Counseling given: Not Answered   Clinical Intake:  Diabetic? No  Interpreter Needed?: NoActivities of Daily Living In your present state of health, do you have any difficulty performing the following activities: 07/31/2021  Hearing? N  Vision? N  Difficulty concentrating or making decisions? N  Walking or climbing stairs? N  Dressing or bathing? N   Doing errands, shopping? N  Preparing Food and eating ? N  Using the Toilet? N  In the past six months, have you accidently leaked urine? N  Do you have problems with loss of bowel control? N  Managing your Medications? N  Managing your Finances? N  Housekeeping or managing your Housekeeping? N  Some recent data might be hidden    Patient Care Team: Billie Ruddy, MD as PCP - General (Family Medicine)  Indicate any recent Medical Services you may have received from other  than [facility] providers in the past year (date may be approximate).     Assessment:   This is a routine wellness examination for [place].  Virtual Visit via Telephone Note  I connected with  Lizabeth Leyden on 07/31/21 at  9:45 AM EST by telephone and verified that I am speaking with the correct person using two identifiers.  Location: Patient: Home Provider: Office Persons participating in the virtual visit: patient/Nurse Health Advisor   I discussed the limitations, risks, security and privacy concerns of performing an evaluation and management service by telephone and the availability of in person appointments. The patient expressed understanding and agreed to proceed.  Interactive audio and video telecommunications were attempted between this nurse and patient, however failed, due to patient having technical difficulties OR patient did not have access to video capability.  We continued and completed visit with audio only.  Some vital signs may be absent or patient reported.   [name], LPN   Hearing/Vision screen Hearing Screening - Comments:: No difficulty hearing Vision Screening - Comments:: Wears glasses. Followed by [facility]  Dietary issues and exercise activities discussed: Current Exercise Habits: Home exercise routine, Time (Minutes): 30, Frequency (Times/Week): 1, Weekly Exercise (Minutes/Week): 30, Intensity: Mild   Goals Addressed             This Visit's Progress     Exercise 3x per week (30 min per time)         Depression Screen PHQ 2/9 Scores 07/31/2021 07/30/2020 07/13/2019 12/01/2018 04/18/2018 09/29/2017 11/27/2015  PHQ - 2 Score 0 0 0 0 0 0 0  PHQ- 9 Score - 0 3 - - - -    Fall Risk Fall Risk  07/31/2021 07/30/2020 12/01/2018  Falls in the past year? 0 0 0  Number falls in past yr: 0 0 -  Injury with Fall? 0 0 -  Follow up - Falls evaluation completed;Falls prevention discussed -    FALL RISK PREVENTION PERTAINING TO THE HOME:  Any stairs in or around the home? No  If so, are there any without handrails? No  Home free of loose throw rugs in walkways, pet beds, electrical cords, etc? Yes  Adequate lighting in your home to reduce risk of falls? Yes   ASSISTIVE DEVICES UTILIZED TO PREVENT FALLS:  Life alert? No  Use of a cane, walker or w/c? No Grab bars in the bathroom? Yes  Shower chair or bench in shower? No  Elevated toilet seat or a handicapped toilet? No   TIMED UP AND GO:  Was the test performed? No . Audio Visit  Cognitive Function:     Immunizations Immunization History  Administered Date(s) Administered   Hepatitis B, adult 10/14/2012, 12/16/2012, 11/09/2014, 12/10/2014, 01/09/2015, 05/13/2015   Hepatitis B, ped/adol 10/14/2012, 12/16/2012, 11/09/2014, 12/10/2014, 01/09/2015, 05/13/2015   PFIZER(Purple Top)SARS-COV-2 Vaccination 01/02/2020, 01/23/2020, 05/07/2020, 07/24/2021    TDAP status: Due, Education has been provided regarding the importance of this vaccine. Advised may receive this vaccine at local pharmacy or Health Dept. Aware to provide a copy of the vaccination record if obtained from local pharmacy or Health Dept. Verbalized acceptance and understanding.  Flu Vaccine status: Declined, Education has been provided regarding the importance of this vaccine but patient still declined. Advised may receive this vaccine at local pharmacy or Health Dept. Aware to provide a copy of the vaccination record if obtained from local  pharmacy or Health Dept. Verbalized acceptance and understanding.  Pneumococcal vaccine status: Declined,  Education  has been provided regarding the importance of this vaccine but patient still declined. Advised may receive this vaccine at local pharmacy or Health Dept. Aware to provide a copy of the vaccination record if obtained from local pharmacy or Health Dept. Verbalized acceptance and understanding.   Covid-19 vaccine status: Completed vaccines   Screening Tests Health Maintenance  Topic Date Due   URINE MICROALBUMIN  08/31/2021 (Originally 06/29/1992)   PAP SMEAR-Modifier  09/11/2021 (Originally 04/18/2021)   INFLUENZA VACCINE  10/24/2021 (Originally 02/24/2021)   Pneumococcal Vaccine 42-71 Years old (1 - PCV) 07/31/2022 (Originally 06/29/1988)   TETANUS/TDAP  07/31/2022 (Originally 06/29/2001)   Hepatitis C Screening  07/31/2022 (Originally 06/29/2000)   COVID-19 Vaccine (5 - Booster for Pfizer series) 09/18/2021   HIV Screening  Completed   HPV VACCINES  Aged Out    Health Maintenance  There are no preventive care reminders to display for this patient.   Additional Screening:   Vision Screening: Recommended annual ophthalmology exams for early detection of glaucoma and other disorders of the eye. Is the patient up to date with their annual eye exam?  Yes  Who is the provider or what is the name of the office in which the patient attends annual eye exams? Followed [facility]  Dental Screening: Recommended annual dental exams for proper oral hygiene  Community Resource Referral / Chronic Care Management:  CRR required this visit?  No   CCM required this visit?  No      Plan:     I have personally reviewed and noted the following in the patients chart:   Medical and social history Use of alcohol, tobacco or illicit drugs  Current medications and supplements including opioid prescriptions. Patient not currently taking opioids Functional ability and  status Nutritional status Physical activity Advanced directives List of other physicians Hospitalizations, surgeries, and ER visits in previous 12 months Vitals Screenings to include cognitive, depression, and falls Referrals and appointments  In addition, I have reviewed and discussed with patient certain preventive protocols, quality metrics, and best practice recommendations. A written personalized care plan for preventive services as well as general preventive health recommendations were provided to patient.     [name], LPN   09/27/3830

## 2021-07-31 NOTE — Patient Instructions (Addendum)
Cindy Luna , Thank you for taking time to come for your Medicare Wellness Visit. I appreciate your ongoing commitment to your health goals. Please review the following plan we discussed and let me know if I can assist you in the future.   These are the goals we discussed:  Goals      Exercise 3x per week (30 min per time)        This is a list of the screening recommended for you and due dates:  Health Maintenance  Topic Date Due   Urine Protein Check  08/31/2021*   Pap Smear  09/11/2021*   Flu Shot  10/24/2021*   Pneumococcal Vaccination (1 - PCV) 07/31/2022*   Tetanus Vaccine  07/31/2022*   Hepatitis C Screening: USPSTF Recommendation to screen - Ages 18-79 yo.  07/31/2022*   COVID-19 Vaccine (5 - Booster for [place] series) 09/18/2021   HIV Screening  Completed   HPV Vaccine  Aged Out  *Topic was postponed. The date shown is not the original due date.   Advanced directives: No  Conditions/risks identified: None  Next appointment: Follow up in one year for your annual wellness visit    Preventive Care 65 Years and Older, Female Preventive care refers to lifestyle choices and visits with your health care provider that can promote health and wellness. What does preventive care include? A yearly physical exam. This is also called an annual well check. Dental exams once or twice a year. Routine eye exams. Ask your health care provider how often you should have your eyes checked. Personal lifestyle choices, including: Daily care of your teeth and gums. Regular physical activity. Eating a healthy diet. Avoiding tobacco and drug use. Limiting alcohol use. Practicing safe sex. Taking low-dose aspirin every day. Taking vitamin and mineral supplements as recommended by your health care provider. What happens during an annual well check? The services and screenings done by your health care provider during your annual well check will depend on your age, overall health, lifestyle  risk factors, and family history of disease. Counseling  Your health care provider may ask you questions about your: Alcohol use. Tobacco use. Drug use. Emotional well-being. Home and relationship well-being. Sexual activity. Eating habits. History of falls. Memory and ability to understand (cognition). Work and work Statistician. Reproductive health. Screening  You may have the following tests or measurements: Height, weight, and BMI. Blood pressure. Lipid and cholesterol levels. These may be checked every 5 years, or more frequently if you are over 55 years old. Skin check. Lung cancer screening. You may have this screening every year starting at age 60 if you have a 30-pack-year history of smoking and currently smoke or have quit within the past 15 years. Fecal occult blood test (FOBT) of the stool. You may have this test every year starting at age 22. Flexible sigmoidoscopy or colonoscopy. You may have a sigmoidoscopy every 5 years or a colonoscopy every 10 years starting at age 67. Hepatitis C blood test. Hepatitis B blood test. Sexually transmitted disease (STD) testing. Diabetes screening. This is done by checking your blood sugar (glucose) after you have not eaten for a while (fasting). You may have this done every 1-3 years. Bone density scan. This is done to screen for osteoporosis. You may have this done starting at age 23. Mammogram. This may be done every 1-2 years. Talk to your health care provider about how often you should have regular mammograms. Talk with your health care provider about your test  results, treatment options, and if necessary, the need for more tests. Vaccines  Your health care provider may recommend certain vaccines, such as: Influenza vaccine. This is recommended every year. Tetanus, diphtheria, and acellular pertussis (Tdap, Td) vaccine. You may need a Td booster every 10 years. Zoster vaccine. You may need this after age 76. Pneumococcal  13-valent conjugate (PCV13) vaccine. One dose is recommended after age 81. Pneumococcal polysaccharide (PPSV23) vaccine. One dose is recommended after age 29. Talk to your health care provider about which screenings and vaccines you need and how often you need them. This information is not intended to replace advice given to you by your health care provider. Make sure you discuss any questions you have with your health care provider. Document Released: 08/09/2015 Document Revised: 04/01/2016 Document Reviewed: 05/14/2015 Elsevier Interactive Patient Education  2017 [place] Prevention in the Home Falls can cause injuries. They can happen to people of all ages. There are many things you can do to make your home safe and to help prevent falls. What can I do on the outside of my home? Regularly fix the edges of walkways and driveways and fix any cracks. Remove anything that might make you trip as you walk through a door, such as a raised step or threshold. Trim any bushes or trees on the path to your home. Use bright outdoor lighting. Clear any walking paths of anything that might make someone trip, such as rocks or tools. Regularly check to see if handrails are loose or broken. Make sure that both sides of any steps have handrails. Any raised decks and porches should have guardrails on the edges. Have any leaves, snow, or ice cleared regularly. Use sand or salt on walking paths during winter. Clean up any spills in your garage right away. This includes oil or grease spills. What can I do in the bathroom? Use night lights. Install grab bars by the toilet and in the tub and shower. Do not use towel bars as grab bars. Use non-skid mats or decals in the tub or shower. If you need to sit down in the shower, use a plastic, non-slip stool. Keep the floor dry. Clean up any water that spills on the floor as soon as it happens. Remove soap buildup in the tub or shower regularly. Attach  bath mats securely with double-sided non-slip rug tape. Do not have throw rugs and other things on the floor that can make you trip. What can I do in the bedroom? Use night lights. Make sure that you have a light by your bed that is easy to reach. Do not use any sheets or blankets that are too big for your bed. They should not hang down onto the floor. Have a firm chair that has side arms. You can use this for support while you get dressed. Do not have throw rugs and other things on the floor that can make you trip. What can I do in the kitchen? Clean up any spills right away. Avoid walking on wet floors. Keep items that you use a lot in easy-to-reach places. If you need to reach something above you, use a strong step stool that has a grab bar. Keep electrical cords out of the way. Do not use floor polish or wax that makes floors slippery. If you must use wax, use non-skid floor wax. Do not have throw rugs and other things on the floor that can make you trip. What can I do with my stairs?  Do not leave any items on the stairs. Make sure that there are handrails on both sides of the stairs and use them. Fix handrails that are broken or loose. Make sure that handrails are as long as the stairways. Check any carpeting to make sure that it is firmly attached to the stairs. Fix any carpet that is loose or worn. Avoid having throw rugs at the top or bottom of the stairs. If you do have throw rugs, attach them to the floor with carpet tape. Make sure that you have a light switch at the top of the stairs and the bottom of the stairs. If you do not have them, ask someone to add them for you. What else can I do to help prevent falls? Wear shoes that: Do not have high heels. Have rubber bottoms. Are comfortable and fit you well. Are closed at the toe. Do not wear sandals. If you use a stepladder: Make sure that it is fully opened. Do not climb a closed stepladder. Make sure that both sides of the  stepladder are locked into place. Ask someone to hold it for you, if possible. Clearly mark and make sure that you can see: Any grab bars or handrails. First and last steps. Where the edge of each step is. Use tools that help you move around (mobility aids) if they are needed. These include: Canes. Walkers. Scooters. Crutches. Turn on the lights when you go into a dark area. Replace any light bulbs as soon as they burn out. Set up your furniture so you have a clear path. Avoid moving your furniture around. If any of your floors are uneven, fix them. If there are any pets around you, be aware of where they are. Review your medicines with your doctor. Some medicines can make you feel dizzy. This can increase your chance of falling. Ask your doctor what other things that you can do to help prevent falls. This information is not intended to replace advice given to you by your health care provider. Make sure you discuss any questions you have with your health care provider. Document Released: 05/09/2009 Document Revised: 12/19/2015 Document Reviewed: 08/17/2014 Elsevier Interactive Patient Education  2017 [employer].

## 2021-08-06 ENCOUNTER — Other Ambulatory Visit (HOSPITAL_COMMUNITY): Payer: Self-pay

## 2021-08-06 MED ORDER — OZEMPIC (1 MG/DOSE) 4 MG/3ML ~~LOC~~ SOPN
PEN_INJECTOR | SUBCUTANEOUS | 4 refills | Status: DC
  Filled 2021-08-06: qty 3, 30d supply, fill #0

## 2021-08-14 ENCOUNTER — Other Ambulatory Visit (HOSPITAL_COMMUNITY): Payer: Self-pay

## 2021-08-15 ENCOUNTER — Other Ambulatory Visit (HOSPITAL_COMMUNITY): Payer: Self-pay

## 2021-08-15 MED ORDER — OZEMPIC (2 MG/DOSE) 8 MG/3ML ~~LOC~~ SOPN
PEN_INJECTOR | SUBCUTANEOUS | 4 refills | Status: DC
  Filled 2021-08-15: qty 3, 28d supply, fill #0
  Filled 2021-09-05: qty 3, 28d supply, fill #1
  Filled 2021-10-08: qty 3, 28d supply, fill #2
  Filled 2021-10-27: qty 3, 28d supply, fill #3
  Filled 2021-11-29: qty 3, 28d supply, fill #4

## 2021-08-15 MED ORDER — OZEMPIC (1 MG/DOSE) 4 MG/3ML ~~LOC~~ SOPN: PEN_INJECTOR | SUBCUTANEOUS | 4 refills | Status: DC

## 2021-09-06 ENCOUNTER — Other Ambulatory Visit (HOSPITAL_COMMUNITY): Payer: Self-pay

## 2021-10-08 ENCOUNTER — Other Ambulatory Visit (HOSPITAL_COMMUNITY): Payer: Self-pay

## 2021-10-09 ENCOUNTER — Other Ambulatory Visit (HOSPITAL_COMMUNITY): Payer: Self-pay

## 2021-10-27 ENCOUNTER — Other Ambulatory Visit (HOSPITAL_COMMUNITY): Payer: Self-pay

## 2021-10-29 ENCOUNTER — Other Ambulatory Visit (HOSPITAL_COMMUNITY): Payer: Self-pay

## 2021-11-29 ENCOUNTER — Other Ambulatory Visit (HOSPITAL_COMMUNITY): Payer: Self-pay

## 2021-12-30 ENCOUNTER — Other Ambulatory Visit: Payer: Self-pay | Admitting: Nephrology

## 2021-12-30 ENCOUNTER — Other Ambulatory Visit (HOSPITAL_COMMUNITY): Payer: Self-pay

## 2021-12-31 ENCOUNTER — Other Ambulatory Visit (HOSPITAL_COMMUNITY): Payer: Self-pay

## 2021-12-31 MED ORDER — OZEMPIC (2 MG/DOSE) 8 MG/3ML ~~LOC~~ SOPN
PEN_INJECTOR | SUBCUTANEOUS | 11 refills | Status: DC
  Filled 2021-12-31: qty 3, 28d supply, fill #0
  Filled 2022-01-26: qty 3, 28d supply, fill #1
  Filled 2022-03-10: qty 3, 28d supply, fill #2
  Filled 2022-04-02: qty 3, 28d supply, fill #3
  Filled 2022-04-30: qty 3, 28d supply, fill #4
  Filled 2022-05-31: qty 3, 28d supply, fill #5
  Filled 2022-06-27: qty 3, 28d supply, fill #6
  Filled 2022-07-29: qty 3, 28d supply, fill #7

## 2022-01-01 ENCOUNTER — Other Ambulatory Visit (HOSPITAL_COMMUNITY): Payer: Self-pay

## 2022-01-26 ENCOUNTER — Other Ambulatory Visit: Payer: Self-pay | Admitting: Family Medicine

## 2022-01-26 ENCOUNTER — Other Ambulatory Visit (HOSPITAL_COMMUNITY): Payer: Self-pay

## 2022-02-03 ENCOUNTER — Other Ambulatory Visit (HOSPITAL_COMMUNITY): Payer: Self-pay

## 2022-03-10 ENCOUNTER — Other Ambulatory Visit (HOSPITAL_COMMUNITY): Payer: Self-pay

## 2022-04-02 ENCOUNTER — Other Ambulatory Visit (HOSPITAL_COMMUNITY): Payer: Self-pay

## 2022-04-02 ENCOUNTER — Ambulatory Visit (INDEPENDENT_AMBULATORY_CARE_PROVIDER_SITE_OTHER): Payer: 59 | Admitting: Orthopaedic Surgery

## 2022-04-02 DIAGNOSIS — M7581 Other shoulder lesions, right shoulder: Secondary | ICD-10-CM

## 2022-04-02 DIAGNOSIS — M7582 Other shoulder lesions, left shoulder: Secondary | ICD-10-CM

## 2022-04-02 DIAGNOSIS — M7501 Adhesive capsulitis of right shoulder: Secondary | ICD-10-CM | POA: Diagnosis not present

## 2022-04-02 MED ORDER — LIDOCAINE HCL 1 % IJ SOLN
4.0000 mL | INTRAMUSCULAR | Status: AC | PRN
  Administered 2022-04-02: 4 mL

## 2022-04-02 MED ORDER — TRIAMCINOLONE ACETONIDE 40 MG/ML IJ SUSP
80.0000 mg | INTRAMUSCULAR | Status: AC | PRN
  Administered 2022-04-02: 80 mg via INTRA_ARTICULAR

## 2022-04-02 NOTE — Progress Notes (Signed)
Chief Complaint: Right shoulder pain     History of Present Illness:   04/02/22: Presents today with some right shoulder pain consistent with her ongoing adhesive capsulitis.  There is some lateral shoulder pain now on the left as well.  This is worse with overhead activities.  Left shoulder does not have restricted range of motion although there is pain.  This is also worse with laying directly on it.  Cindy Luna is a 41 y.o. female with 2 months of atraumatic right shoulder pain.  She is currently on dialysis from end-stage renal disease as result of her lupus.  She states that she have any specific injury or incident to the right shoulder.  She has not noticed increasingly loss of motion and pain in the shoulder.  She is having significant difficulty feeling like she cannot sleep on the shoulder.  She states that she feels like it needs to pop.    Surgical History:   None  PMH/PSH/Family History/Social History/Meds/Allergies:    Past Medical History:  Diagnosis Date  . Anemia   . Depression   . DM (diabetes mellitus) ([place])   . DUB (dysfunctional uterine bleeding) 06/24/2016  . End stage renal disease ([place])   . HLD (hyperlipidemia)   . HTN (hypertension)   . Lupus ([place])   . Ovarian cyst 09/01/2016  . Renal disorder   . Sleep apnea    Past Surgical History:  Procedure Laterality Date  . AV FISTULA PLACEMENT  2016 right, 2009 left   left and right are both working   . CESAREAN SECTION    . OTHER SURGICAL HISTORY Left    AV fistula stents, arm  . TUBAL LIGATION  2010   Social History   Socioeconomic History  . Marital status: Single    Spouse name: Not on file  . Number of children: 1  . Years of education: Not on file  . Highest education level: Not on file  Occupational History  . Not on file  Tobacco Use  . Smoking status: Former    Types: Cigars    Start date: 2018    Quit date: 07/2018    Years since quitting: 3.6  .  Smokeless tobacco: Never  Substance and Sexual Activity  . Alcohol use: No  . Drug use: No  . Sexual activity: Yes    Partners: Male    Birth control/protection: None  Other Topics Concern  . Not on file  Social History Narrative   ** Merged History Encounter **       Level of education: college    Employment: unemployed    Transportation: car    Exercise: no   Housing situation: apt   Relationships (safe): yes   Contact for message (voicemail): [phone]   Social Determinants of Health   Financial Resource Strain: Low Risk  (07/31/2021)   Overall Financial Resource Strain (CARDIA)   . Difficulty of Paying Living Expenses: Not hard at all  Food Insecurity: No Food Insecurity (07/31/2021)   Hunger Vital Sign   . Worried About Charity fundraiser in the Last Year: Never true   . Ran Out of Food in the Last Year: Never true  Transportation Needs: No Transportation Needs (07/31/2021)   PRAPARE - Transportation   . Lack of Transportation (Medical): No   .  Lack of Transportation (Non-Medical): No  Physical Activity: Insufficiently Active (07/31/2021)   Exercise Vital Sign   . Days of Exercise per Week: 1 day   . Minutes of Exercise per Session: 30 min  Stress: No Stress Concern Present (07/31/2021)   [place]   . Feeling of Stress : Not at all  Social Connections: Moderately Integrated (07/31/2021)   Social Connection and Isolation Panel [NHANES]   . Frequency of Communication with Friends and Family: More than three times a week   . Frequency of Social Gatherings with Friends and Family: More than three times a week   . Attends Religious Services: More than 4 times per year   . Active Member of Clubs or Organizations: Yes   . Attends Archivist Meetings: More than 4 times per year   . Marital Status: Never married   Family History  Problem Relation Age of Onset  . Hypertension Mother   . Diabetes  Mother   . Autism Daughter   . Thyroid disease Sister   . Colon cancer Maternal Grandmother   . Prostate cancer Maternal Grandfather   . Breast cancer Maternal Aunt   . Diabetes Other        both sides of the fam  . Hypertension Other        both sides of the fam   Allergies  Allergen Reactions  . Azithromycin Hives  . Benadryl [Diphenhydramine Hcl] Hives  . Doxycycline Hyclate   . Vancomycin Swelling  . Adhesive [Tape] Rash   Current Outpatient Medications  Medication Sig Dispense Refill  . cetirizine (ZYRTEC) 10 MG tablet Take 10 mg by mouth daily.    . cinacalcet (SENSIPAR) 60 MG tablet Take 60 mg by mouth daily with supper.  (Patient not taking: Reported on 04/08/2021)    . cinacalcet (SENSIPAR) 90 MG tablet Take 90 mg by mouth daily.    Marland Kitchen docusate sodium (COLACE) 100 MG capsule Take 100 mg by mouth 2 (two) times daily.    . hydrocortisone cream 0.5 % Apply 1 application topically 2 (two) times daily. (Patient not taking: Reported on 04/08/2021) 30 g 0  . lactobacillus acidophilus (BACID) TABS tablet Take 2 tablets by mouth 3 (three) times daily.    Marland Kitchen linaclotide (LINZESS) 145 MCG CAPS capsule Take 145 mcg by mouth as needed. (Patient not taking: Reported on 04/08/2021)    . linaclotide (LINZESS) 72 MCG capsule Take 72 mcg by mouth daily before breakfast.    . methocarbamol (ROBAXIN) 500 MG tablet Take 1 tablet (500 mg total) by mouth every 6 (six) hours as needed for muscle spasms. (Patient not taking: Reported on 04/08/2021) 60 tablet 0  . norethindrone (AYGESTIN) 5 MG tablet Take 1 tablet by mouth daily.    . pantoprazole (PROTONIX) 40 MG tablet Take 40 mg by mouth daily.  6  . Semaglutide, 1 MG/DOSE, (OZEMPIC, 1 MG/DOSE,) 4 MG/3ML SOPN Inject 1 mg into the skin once a week 3 mL 4  . Semaglutide, 1 MG/DOSE, (OZEMPIC, 1 MG/DOSE,) 4 MG/3ML SOPN Inject 2 mg into to skin once a week 3 mL 4  . Semaglutide, 1 MG/DOSE, (OZEMPIC, 1 MG/DOSE,) 4 MG/3ML SOPN Inject 2 mg into the skin once a  week 3 mL 4  . Semaglutide, 2 MG/DOSE, (OZEMPIC, 2 MG/DOSE,) 8 MG/3ML SOPN Inject 2 mg into the skin once a week 3 mL 4  . Semaglutide, 2 MG/DOSE, (OZEMPIC, 2 MG/DOSE,) 8 MG/3ML SOPN  Inject 2 mg subcutaneously once a week 3 mL 11  . Semaglutide,0.25 or 0.5MG /DOS, (OZEMPIC, 0.25 OR 0.5 MG/DOSE,) 2 MG/1.5ML SOPN Inject into the skin.    Marland Kitchen sevelamer carbonate (RENVELA) 800 MG tablet Take 800 mg by mouth. Take 5 tablets three times daily with meal and 2 tablets two times with snack    . topiramate (TOPAMAX) 100 MG tablet Take 1 tablet (100 mg total) by mouth at bedtime. (Patient taking differently: Take 200 mg by mouth at bedtime.) 1 tablet 0  . triamcinolone cream (KENALOG) 0.1 % Apply 1 application topically 2 (two) times daily. 30 g 0   No current facility-administered medications for this visit.   No results found.  Review of Systems:   A ROS was performed including pertinent positives and negatives as documented in the HPI.  Physical Exam :   Constitutional: NAD and appears stated age Neurological: Alert and oriented Psych: Appropriate affect and cooperative There were no vitals taken for this visit.   Comprehensive Musculoskeletal Exam:    Musculoskeletal Exam    Inspection Right Left  Skin No atrophy or winging No atrophy or winging  Palpation    Tenderness Glenohumeral joint Lateral deltoid  Range of Motion    Flexion (passive) 170 170  Flexion (active) 160 160  Extension 30 30  Abduction 170 170  ER at the side 70 70  ER at 90 of abduction    IR at 90 of abduction    Can reach behind back to L3 L3  Strength     Limited due to pain Normal  Special Tests    Pseudoparalytic No No  Neurologic    Fires PIN, radial, median, ulnar, musculocutaneous, axillary, suprascapular, long thoracic, and spinal accessory innervated muscles. No abnormal sensibility  Vascular/Lymphatic    Radial Pulse 2+ 2+  Cervical Exam    Patient has symmetric cervical range of motion with negative  Spurling's test.  Special Test: Positive pain with passive external rotation     Imaging:   Xray (right shoulder 3 views normal): Normal   I personally reviewed and interpreted the radiographs.   Assessment:   41 year old female with right shoulder pain atraumatic consistent with adhesive capsulitis which is flaring back up.  To that effect I recommended a glenohumeral ultrasound-guided injection.  With regard to the left shoulder I do believe there is a component of overuse and rotator cuff tendinitis as well.  To that effect I have recommended a subacromial injection on the left side.  She would like to proceed with this.  We will plan on getting her in for physical therapy for range of motion on the right and strengthening on the left.  Plan :    -Bilateral shoulder ultrasound-guided injections performed after verbal consent obtained -She will followup prn     Procedure Note  Patient: Cindy Luna             Date of Birth: [birth year]           MRN: [id number]             Visit Date: 04/02/2022  Procedures: Visit Diagnoses:  1. Adhesive capsulitis of right shoulder     Large Joint Inj: R glenohumeral on 04/02/2022 9:56 AM Indications: pain Details: 22 G 1.5 in needle, ultrasound-guided anterior approach  Arthrogram: No  Medications: 4 mL lidocaine 1 %; 80 mg triamcinolone acetonide 40 MG/ML Outcome: tolerated well, no immediate complications Procedure, treatment alternatives, risks and benefits explained, specific risks  discussed. Consent was given by the patient. Immediately prior to procedure a time out was called to verify the correct patient, procedure, equipment, support staff and site/side marked as required. Patient was prepped and draped in the usual sterile fashion.    Large Joint Inj: L subacromial bursa on 04/02/2022 9:57 AM Indications: pain Details: 22 G 1.5 in needle, ultrasound-guided anterior approach  Arthrogram: No  Medications: 4 mL lidocaine 1  %; 80 mg triamcinolone acetonide 40 MG/ML Outcome: tolerated well, no immediate complications Procedure, treatment alternatives, risks and benefits explained, specific risks discussed. Consent was given by the patient. Immediately prior to procedure a time out was called to verify the correct patient, procedure, equipment, support staff and site/side marked as required. Patient was prepped and draped in the usual sterile fashion.      Vanetta Mulders, MD Attending Physician, Orthopedic Surgery  This document was dictated using Dragon voice recognition software. A reasonable attempt at proof reading has been made to minimize errors.

## 2022-04-09 ENCOUNTER — Ambulatory Visit (HOSPITAL_BASED_OUTPATIENT_CLINIC_OR_DEPARTMENT_OTHER): Payer: 59 | Attending: Orthopaedic Surgery | Admitting: Physical Therapy

## 2022-04-09 ENCOUNTER — Encounter (HOSPITAL_BASED_OUTPATIENT_CLINIC_OR_DEPARTMENT_OTHER): Payer: Self-pay | Admitting: Physical Therapy

## 2022-04-09 DIAGNOSIS — M7501 Adhesive capsulitis of right shoulder: Secondary | ICD-10-CM | POA: Diagnosis present

## 2022-04-09 DIAGNOSIS — G8929 Other chronic pain: Secondary | ICD-10-CM

## 2022-04-09 DIAGNOSIS — M62838 Other muscle spasm: Secondary | ICD-10-CM

## 2022-04-09 DIAGNOSIS — M25611 Stiffness of right shoulder, not elsewhere classified: Secondary | ICD-10-CM

## 2022-04-09 NOTE — Therapy (Signed)
OUTPATIENT PHYSICAL THERAPY SHOULDER EVALUATION   Patient Name: Cindy Luna MRN: [id number] DOB:1981-04-29, 41 y.o., female Today's Date: 04/09/2022    Past Medical History:  Diagnosis Date   Anemia    Depression    DM (diabetes mellitus) ([place])    DUB (dysfunctional uterine bleeding) 06/24/2016   End stage renal disease ([place])    HLD (hyperlipidemia)    HTN (hypertension)    Lupus (HCC)    Ovarian cyst 09/01/2016   Renal disorder    Sleep apnea    Past Surgical History:  Procedure Laterality Date   AV FISTULA PLACEMENT  2016 right, 2009 left   left and right are both working    [place] Left    AV fistula stents, arm   TUBAL LIGATION  2010   Patient Active Problem List   Diagnosis Date Noted   Tinea versicolor 11/16/2019   Chronic constipation 11/16/2019   Eczema 11/16/2019   Environmental and seasonal allergies 07/17/2019   History of depression 07/17/2019   Patient is Jehovah's Witness 07/13/2019   Chest tightness 01/04/2019   Dysmenorrhea 05/03/2018   Class 3 severe obesity with serious comorbidity and body mass index (BMI) of 45.0 to 49.9 in adult ([place]) 04/18/2018   Tobacco use disorder 04/18/2018   Lymphadenopathy of head and neck 09/29/2017   OSA (obstructive sleep apnea) 12/10/2014   ESRD (end stage renal disease) on dialysis ([place]) 11/19/2014   Lupus ([place]) 11/19/2014    PCP: ***  REFERRING PROVIDER: ***  REFERRING DIAG: ***  THERAPY DIAG:  No diagnosis found.  Rationale for Evaluation and Treatment {HABREHAB:27488}  ONSET DATE: ***  SUBJECTIVE:                                                                                                                                                                                      SUBJECTIVE STATEMENT: The patient had an acute onset of right shoulder pain. She has had pain before and responded to PT and injections. She had an injection in the right shoulder last  Thursday. She feels like it took a few days but improved.   PERTINENT HISTORY: Renal Failure ( on Dialysis) Depression,   PAIN:  Are you having pain? Yes: NPRS scale: 1-2/10 since the shot  Pain location: left  Pain description: aching  Aggravating factors: reaching back  Relieving factors: the shot helped   PRECAUTIONS: None  WEIGHT BEARING RESTRICTIONS No  FALLS:  Has patient fallen in last 6 months? No  LIVING ENVIRONMENT: None  OCCUPATION: On disability   Hobbies: Nothing   PLOF: Independent  PATIENT GOALS  Get it  stronger   OBJECTIVE:   DIAGNOSTIC FINDINGS:  Shoulder x-ray 8/25 FINDINGS: There is no evidence of fracture or dislocation. There is no evidence of arthropathy or other focal bone abnormality. Soft tissues are unremarkable.   IMPRESSION: Negative.  PATIENT SURVEYS:  FOTO    COGNITION:  Overall cognitive status: Within functional limits for tasks assessed     SENSATION: WFL  POSTURE: Forward   UPPER EXTREMITY ROM:   Active ROM Right eval Left eval  Shoulder flexion    Shoulder extension    Shoulder abduction    Shoulder adduction    Shoulder internal rotation    Shoulder external rotation    Elbow flexion    Elbow extension    Wrist flexion    Wrist extension    Wrist ulnar deviation    Wrist radial deviation    Wrist pronation    Wrist supination    (Blank rows = not tested)  Passive ROM Right eval Left eval  Shoulder flexion    Shoulder extension    Shoulder abduction    Shoulder adduction    Shoulder internal rotation    Shoulder external rotation    Elbow flexion    Elbow extension    Wrist flexion    Wrist extension    Wrist ulnar deviation    Wrist radial deviation    Wrist pronation    Wrist supination    (Blank rows = not tested)  UPPER EXTREMITY MMT:  MMT Right eval Left eval  Shoulder flexion    Shoulder extension    Shoulder abduction    Shoulder adduction    Shoulder internal rotation     Shoulder external rotation    Middle trapezius    Lower trapezius    Elbow flexion    Elbow extension    Wrist flexion    Wrist extension    Wrist ulnar deviation    Wrist radial deviation    Wrist pronation    Wrist supination    Grip strength (lbs)    (Blank rows = not tested)  SHOULDER SPECIAL TESTS:    PALPATION:  ***   TODAY'S TREATMENT:  ***   PATIENT EDUCATION: Education details: *** Person educated: {Person educated:25204} Education method: {Education Method:25205} Education comprehension: {Education Comprehension:25206}   HOME EXERCISE PROGRAM: ***  ASSESSMENT:  CLINICAL IMPRESSION: Patient is a 41 y.o. female who was seen today for physical therapy evaluation and treatment for right shoulder pain. She has repnoded well to the injection in her shoulder. Her primary limitation is internal rotation.    OBJECTIVE IMdecreased ROM, decreased strength, and painPAIRMENTS decreased ROM, decreased strength, and pain.   ACTIVITY LIMITATIONS carrying, lifting, and reach over head  PARTICIPATION LIMITATIONS: cleaning, driving, and shopping  PERSONAL FACTORS 3+ comorbidities: *** are also affecting patient's functional outcome.   REHAB POTENTIAL: Good  CLINICAL DECISION MAKING: Stable/uncomplicated decreasing pain since the injection   EVALUATION COMPLEXITY: Low   GOALS: Goals reviewed with patient? Yes  SHORT TERM GOALS: Target date: 04/30/2022  (Remove Blue Hyperlink)  Patient will have full edn reactive right shoulder flexion  Baseline: Goal status: INITIAL  2.  Patient will improve active IR to her gluteal without pain  Baseline:  Goal status: INITIAL  3.  Patient will e independent with basic HEP  Baseline:  Goal status: INITIAL   LONG TERM GOALS: Target date: 05/21/2022    Patient will reach behind her back to an equal spot on the left without pain in order to  perform ADL's  Baseline:  Goal status: INITIAL  2.  Patient will reach a 2lb  with her right shoulder without pain  Baseline:  Goal status: INITIAL  3.  Patient will be independent with a complete exercise program for her shoudler  Baseline:  Goal status: INITIAL   PLAN: PT FREQUENCY: 2x/week  PT DURATION: 8 weeks  PLANNED INTERVENTIONS: Therapeutic exercises, Therapeutic activity, Gait training, Patient/Family education, Self Care, Joint mobilization, Aquatic Therapy, Dry Needling, Cryotherapy, Moist heat, Taping, Ultrasound, and Manual therapy  PLAN FOR NEXT SESSION: continue to progress exercises, manual therapy if needed. She has an upper trap spasm, consder reviewing the thera-cane; consider ABC; consider yellow band IR/ER Consider shoulder extension to neutral    Carney Living, PT 04/09/2022, 9:36 AM

## 2022-04-10 ENCOUNTER — Other Ambulatory Visit (HOSPITAL_COMMUNITY): Payer: Self-pay

## 2022-04-11 IMAGING — DX DG SHOULDER 2+V*R*
3 series · 3 of 3 positions shown · non-contrast
Comparison: None.

CLINICAL DATA: Right shoulder pain, limited range of movement

EXAM:
RIGHT SHOULDER - 2+ VIEW

[shoulder grashey]
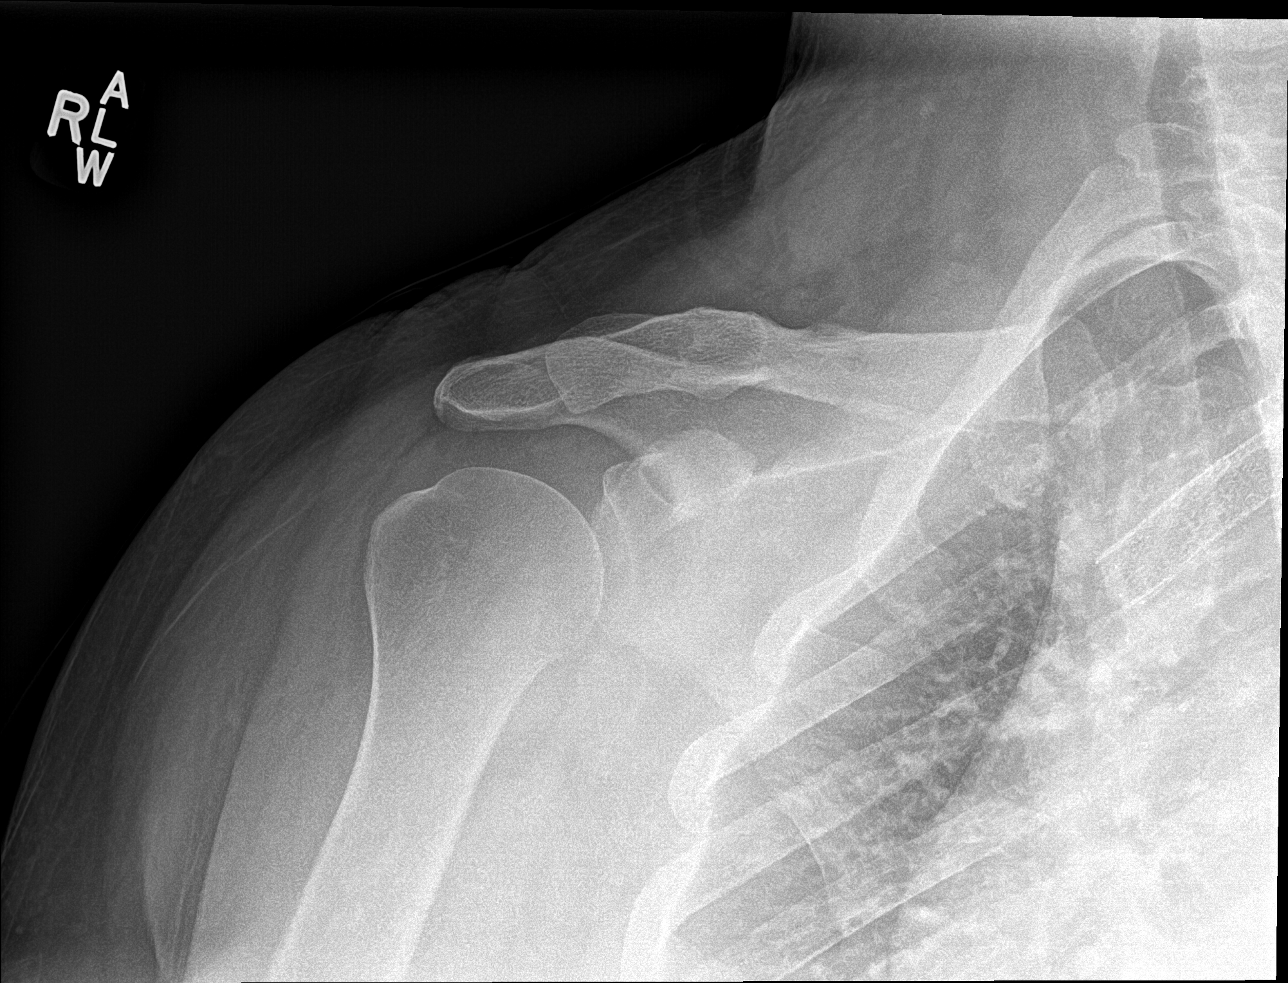

[shoulder y view]
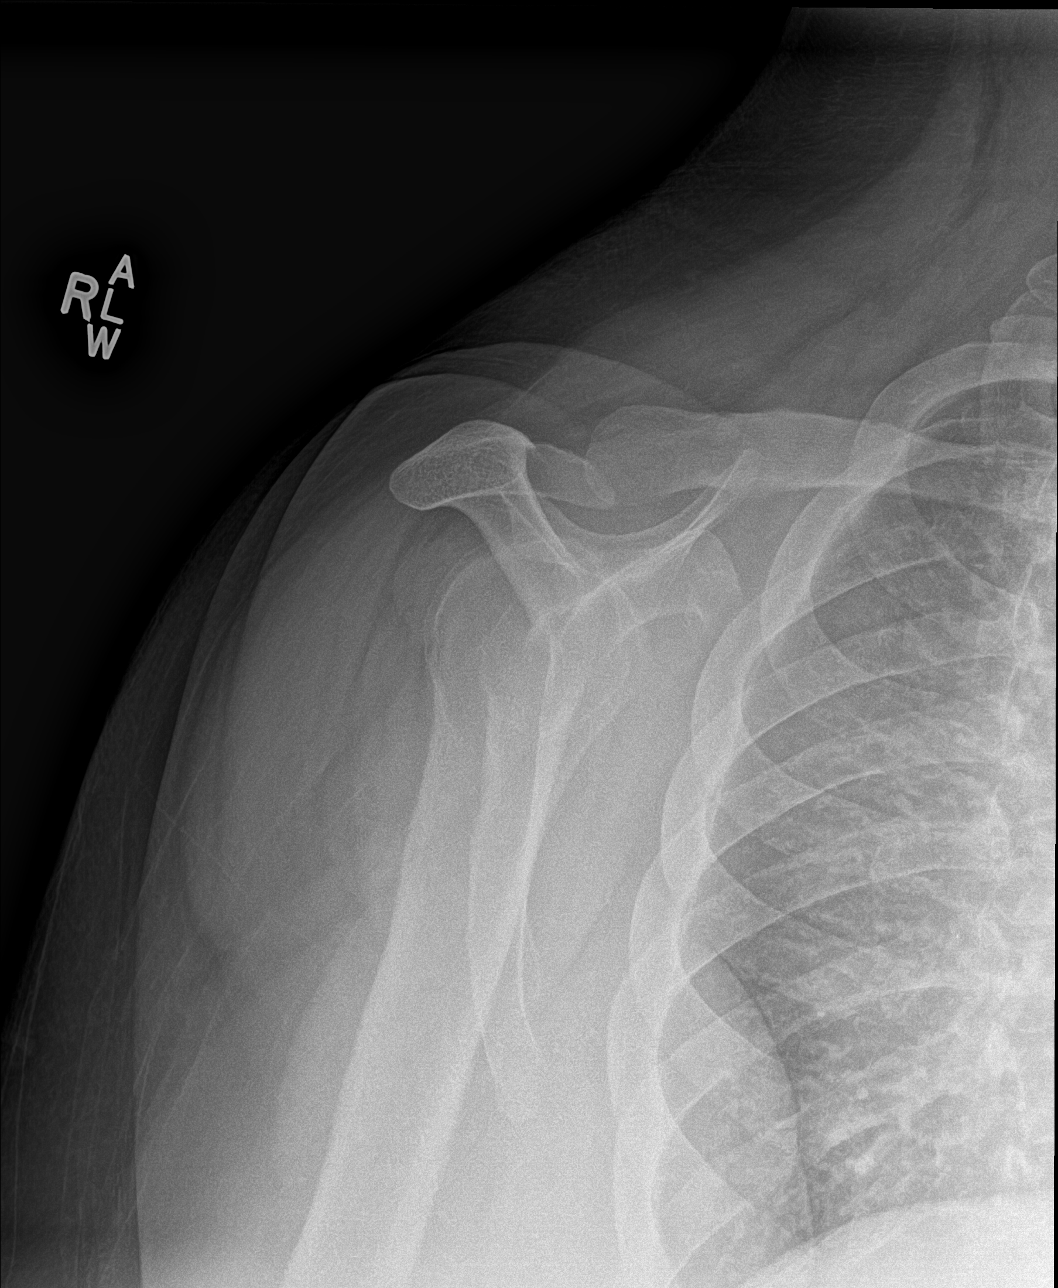

[shoulder axillary]
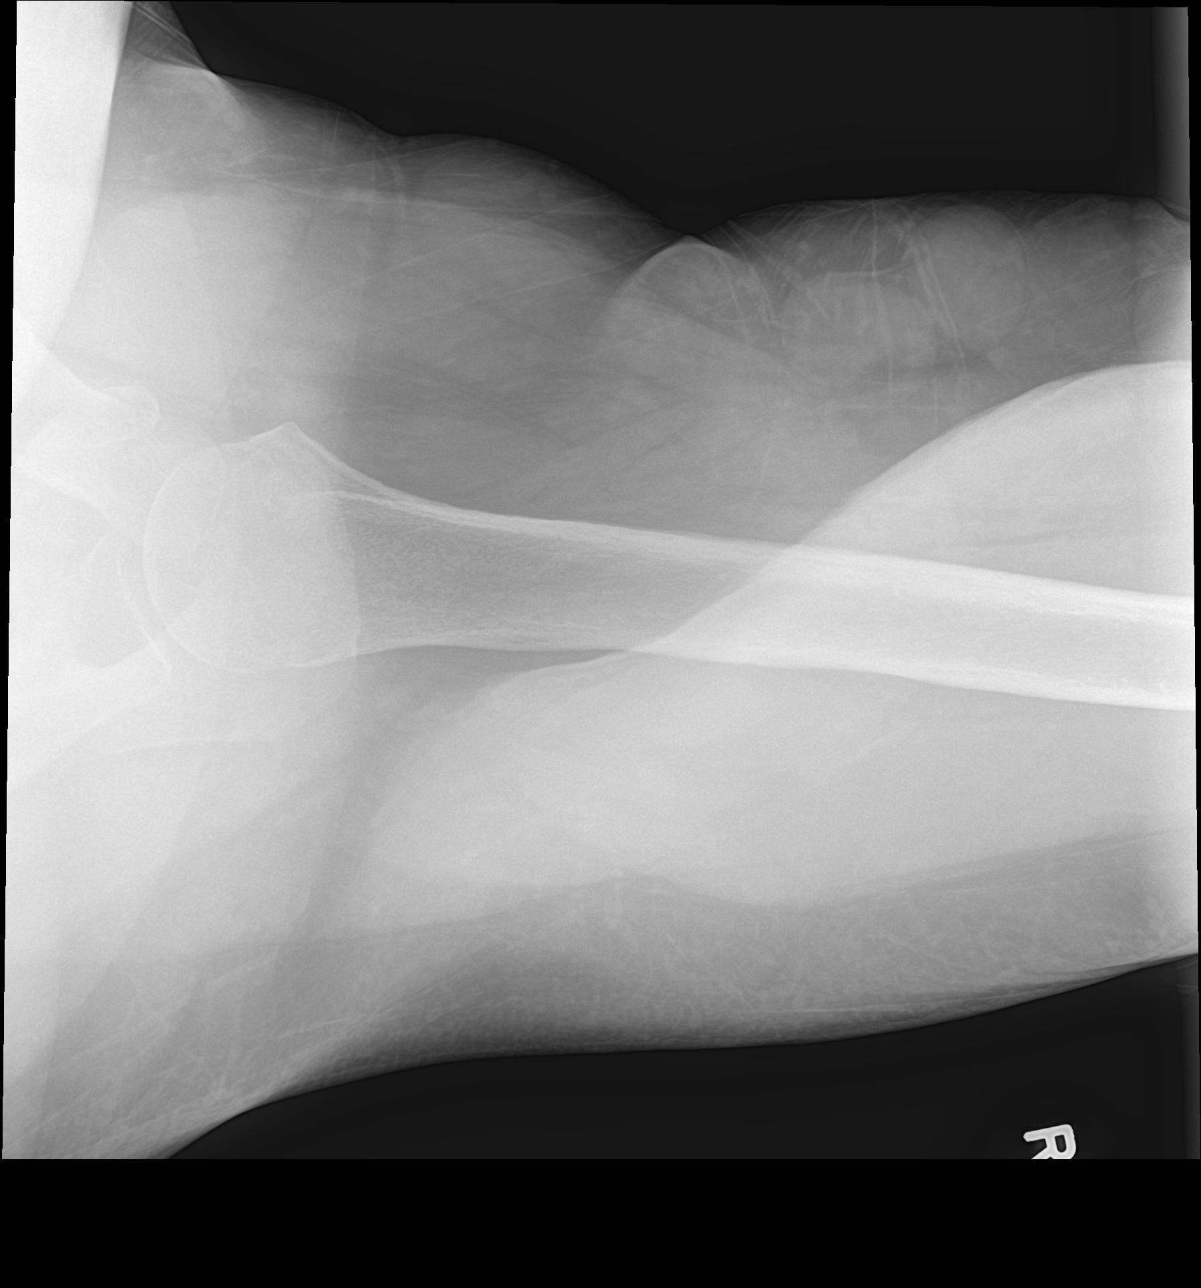

[3 of 3 positions shown; findings below may reference images not displayed]

FINDINGS: There is no evidence of fracture or dislocation. There is no
evidence of arthropathy or other focal bone abnormality. Soft
tissues are unremarkable.
IMPRESSION: Negative.

## 2022-04-14 ENCOUNTER — Encounter (HOSPITAL_BASED_OUTPATIENT_CLINIC_OR_DEPARTMENT_OTHER): Payer: 59 | Admitting: Physical Therapy

## 2022-04-16 ENCOUNTER — Ambulatory Visit (INDEPENDENT_AMBULATORY_CARE_PROVIDER_SITE_OTHER): Payer: 59 | Admitting: Family Medicine

## 2022-04-16 ENCOUNTER — Encounter: Payer: Self-pay | Admitting: Family Medicine

## 2022-04-16 ENCOUNTER — Encounter (HOSPITAL_BASED_OUTPATIENT_CLINIC_OR_DEPARTMENT_OTHER): Payer: Self-pay | Admitting: Physical Therapy

## 2022-04-16 VITALS — BP 122/78 | HR 94 | Temp 98.4°F | Wt 279.4 lb

## 2022-04-16 DIAGNOSIS — N186 End stage renal disease: Secondary | ICD-10-CM | POA: Diagnosis not present

## 2022-04-16 DIAGNOSIS — L989 Disorder of the skin and subcutaneous tissue, unspecified: Secondary | ICD-10-CM | POA: Diagnosis not present

## 2022-04-16 DIAGNOSIS — Z992 Dependence on renal dialysis: Secondary | ICD-10-CM

## 2022-04-16 DIAGNOSIS — H539 Unspecified visual disturbance: Secondary | ICD-10-CM

## 2022-04-16 DIAGNOSIS — R42 Dizziness and giddiness: Secondary | ICD-10-CM

## 2022-04-16 LAB — POCT GLYCOSYLATED HEMOGLOBIN (HGB A1C): Hemoglobin A1C: 5.3 % (ref 4.0–5.6)

## 2022-04-16 NOTE — Progress Notes (Signed)
Subjective:    Patient ID: Cindy Luna, female    DOB: November 23, 1980, 41 y.o.   MRN: [id number]  Chief Complaint  Patient presents with   Rash    Rash on shoulders and back, noticed less than a week ago. Small bumps on arms, clusters of them on shoulders and a few on her back. Not bothersome, looks like pimples, wants to pop them. Was told not to as they may spread.     HPI Patient is a 41 yo female with pmh sig for Lupus, ESRD on HD, obesity, was seen today for acute concern.  Pt with small pimple like bumps on upper arms and upper back and shoulders x 1 wk.  Areas are not pruritic or painful.  Pt has not changed soaps or lotions recently.  Had to change bed sheets 2/2 increased sweating last wk.    Patient endorses blurred vision with driving.  Nancy Fetter makes vision worse.  Endorses history of floaters in vision x16 years.  At times feels like vision "closes in on me".    Patient also endorses intermittent dizziness with standing.  BP stable.  ESRD on HD.  Fluid restriction 32 ounces/day.  Dry weight 124.5 kg.  Past Medical History:  Diagnosis Date   Anemia    Depression    DM (diabetes mellitus) ([place])    DUB (dysfunctional uterine bleeding) 06/24/2016   End stage renal disease (HCC)    HLD (hyperlipidemia)    HTN (hypertension)    Lupus (HCC)    Ovarian cyst 09/01/2016   Renal disorder    Sleep apnea     Allergies  Allergen Reactions   Azithromycin Hives   Benadryl [Diphenhydramine Hcl] Hives   Doxycycline Hyclate    Vancomycin Swelling   Adhesive [Tape] Rash    ROS General: Denies fever, chills, changes in weight, changes in appetite  + increased sweating at night, dizziness HEENT: Denies headaches, ear pain, rhinorrhea, sore throat  + changes in vision CV: Denies CP, palpitations, SOB, orthopnea Pulm: Denies SOB, cough, wheezing GI: Denies abdominal pain, nausea, vomiting, diarrhea, constipation GU: Denies dysuria, hematuria, frequency, vaginal discharge Msk: Denies  muscle cramps, joint pains Neuro: Denies weakness, numbness, tingling  Skin: Denies bruising  + rash Psych: Denies depression, anxiety, hallucinations    Objective:    Blood pressure 122/78, pulse 94, temperature 98.4 F (36.9 C), temperature source Oral, weight 279 lb 6.4 oz (126.7 kg), SpO2 96 %. Vision Screening   Right eye Left eye Both eyes  Without correction     With correction 20/20 20/15 20/13      Gen. Pleasant, well-nourished, in no distress, normal affect   HEENT: Gas/AT, face symmetric, conjunctiva clear, no scleral icterus, PERRLA, EOMI, nares patent without drainage Lungs: no accessory muscle use, CTAB, no wheezes or rales Cardiovascular: RRR, no m/r/g, no peripheral edema. Neuro:  A&Ox3, CN II-XII intact, normal gait Skin:  Warm, dry, intact.  Fine papules with a few scattered pustules on shoulders without erythema, increased warmth.   Wt Readings from Last 3 Encounters:  04/16/22 279 lb 6.4 oz (126.7 kg)  07/31/21 (!) 304 lb (137.9 kg)  04/01/21 (!) 304 lb (137.9 kg)    Lab Results  Component Value Date   WBC 10.4 04/29/2018   HGB 12.3 04/29/2018   HCT 36.4 04/29/2018   PLT 273 04/29/2018   GLUCOSE 110 (H) 04/29/2018   CHOL 166 04/18/2018   TRIG 334 (H) 04/18/2018   HDL 38 (L) 04/18/2018   [place]  61 04/18/2018   ALT 9 04/29/2018   AST 10 (L) 04/29/2018   NA 136 04/29/2018   K 3.4 (L) 04/29/2018   CL 98 04/29/2018   CREATININE 6.18 (H) 04/29/2018   BUN 14 04/29/2018   CO2 23 04/29/2018   TSH 3.42 02/25/2021   HGBA1C 5.6 02/25/2021    Assessment/Plan:  Vision changes -Discussed possible causes including hyperglycemia, eyestrain, atypical migraines, vessel occlusion, etc -Hemoglobin A1c 5.6% on 02/25/2021 -Vision screening done in office: Right eye 20/20, left eye 20/15, both eyes 20/13 -Discussed scheduling eye appointment for more focused vision screening  - Plan: POCT glycosylated hemoglobin (Hb A1C)  Dizziness  -Discussed the importance of  drinking up to fluid restriction as dehydration may be contributing to dizziness. -BP well controlled.  Not on medication. -Also consider medications as cause - Plan: POCT glycosylated hemoglobin (Hb A1C)  ESRD (end stage renal disease) on dialysis (HCC) -Continue HD MWF -Dry weight 124.5 kg -Avoid nephrotoxic meds and renally dose medications -Continue follow-up with nephrology  Dermatosis -Skin irritation likely 2/2 increased sweating at night and increased sun exposure. -Discussed supportive care with gentle moisturizers, soaps, lotions, detergents. -Patient advised to avoid picking at papules/pustules. -Dermatosis likely self-limiting. -Given precautions for continued or worsening symptoms.  F/u prn  Grier Mitts, MD

## 2022-04-16 NOTE — Patient Instructions (Signed)
Call to schedule a follow up appt with your eye doctor.

## 2022-04-22 NOTE — Therapy (Signed)
OUTPATIENT PHYSICAL THERAPY TREATMENT NOTE  Patient Name: Cindy Luna MRN: [id number] DOB:[birth year], 41 y.o., female Today's Date: 04/09/2022       Past Medical History:  Diagnosis Date   Anemia    Depression    DM (diabetes mellitus) ([place])    DUB (dysfunctional uterine bleeding) 06/24/2016   End stage renal disease ([place])    HLD (hyperlipidemia)    HTN (hypertension)    Lupus (HCC)    Ovarian cyst 09/01/2016   Renal disorder    Sleep apnea    Past Surgical History:  Procedure Laterality Date   AV FISTULA PLACEMENT  2016 right, 2009 left   left and right are both working    [place] Left    AV fistula stents, arm   TUBAL LIGATION  2010   Patient Active Problem List   Diagnosis Date Noted   Tinea versicolor 11/16/2019   Chronic constipation 11/16/2019   Eczema 11/16/2019   Environmental and seasonal allergies 07/17/2019   History of depression 07/17/2019   Patient is Jehovah's Witness 07/13/2019   Chest tightness 01/04/2019   Dysmenorrhea 05/03/2018   Class 3 severe obesity with serious comorbidity and body mass index (BMI) of 45.0 to 49.9 in adult ([place]) 04/18/2018   Tobacco use disorder 04/18/2018   Lymphadenopathy of head and neck 09/29/2017   OSA (obstructive sleep apnea) 12/10/2014   ESRD (end stage renal disease) on dialysis ([place]) 11/19/2014   Lupus ([place]) 11/19/2014    PCP: Grier Mitts MD   REFERRING PROVIDER: Dr Vanetta Mulders   REFERRING DIAG: Right shoulder pain   THERAPY DIAG:  No diagnosis found.  Rationale for Evaluation and Treatment Rehabilitation  ONSET DATE: > 6 months   SUBJECTIVE:                                                                                                                                                                                      SUBJECTIVE STATEMENT: The patient had an acute onset of right shoulder pain. She has had pain before and responded to PT and injections. She  had an injection in the right shoulder last Thursday. She feels like it took a few days but improved.   Pt received injections in bilat shoulders on 9/7.  MD note indicated adhesive capsulitis on R shoulder and RTC tendinitis on L.  PT order indicated R shoulder ROM for adhesive capsulitis and L shoulder strengthening for RC tendinitis.      PERTINENT HISTORY: Renal Failure ( on Dialysis) Depression,   PAIN:  Are you having pain? Yes: NPRS scale: 1-2/10 since the shot  Pain location:  left  Pain description: aching  Aggravating factors: reaching back  Relieving factors: the shot helped   PRECAUTIONS: None  WEIGHT BEARING RESTRICTIONS No  FALLS:  Has patient fallen in last 6 months? No  LIVING ENVIRONMENT: None  OCCUPATION: On disability   Hobbies: Nothing   PLOF: Independent  PATIENT GOALS  Get it stronger   OBJECTIVE:   DIAGNOSTIC FINDINGS:  Shoulder x-ray 8/25 FINDINGS: There is no evidence of fracture or dislocation. There is no evidence of arthropathy or other focal bone abnormality. Soft tissues are unremarkable.   IMPRESSION: Negative.  PATIENT SURVEYS:  FOTO    COGNITION:  Overall cognitive status: Within functional limits for tasks assessed     SENSATION: WFL  POSTURE: Forward   UPPER EXTREMITY ROM:   Active ROM Right eval Left eval  Shoulder flexion 95 140  Shoulder extension    Shoulder abduction    Shoulder adduction    Shoulder internal rotation To gluteal with pain  To L2   Shoulder external rotation Painful behind the head  No pain   Elbow flexion    Elbow extension    Wrist flexion    Wrist extension    Wrist ulnar deviation    Wrist radial deviation    Wrist pronation    Wrist supination    (Blank rows = not tested)  Passive ROM Right eval Left eval  Shoulder flexion Full    Shoulder extension    Shoulder abduction    Shoulder adduction    Shoulder internal rotation full    Shoulder external rotation Full    Elbow  flexion    Elbow extension    Wrist flexion    Wrist extension    Wrist ulnar deviation    Wrist radial deviation    Wrist pronation    Wrist supination    (Blank rows = not tested)  UPPER EXTREMITY MMT:  MMT Right eval Left eval  Shoulder flexion 3+   Shoulder extension    Shoulder abduction    Shoulder adduction    Shoulder internal rotation 4   Shoulder external rotation 3+   Middle trapezius    Lower trapezius    Elbow flexion    Elbow extension    Wrist flexion    Wrist extension    Wrist ulnar deviation    Wrist radial deviation    Wrist pronation    Wrist supination    Grip strength (lbs)    (Blank rows = not tested)  SHOULDER SPECIAL TESTS:    PALPATION:  Spasming in the upper trap and tender to palpation in the anterior shoulder    TODAY'S TREATMENT:  Therapeutic Exercise: Reviewed current function, pain level, response to prior Rx, and HEP compliance. Reviewed HEP. Pt performed:   Access Code: [email or web address] URL: [email or web address] Date: 04/16/2022 Prepared by: Carolyne Littles  Exercises - Supine Shoulder Flexion Extension AAROM with Dowel  - 1 x daily - 7 x weekly - 3 sets - 10 reps - Sidelying Shoulder External Rotation  - 2 x daily - 7 x weekly - 3 sets - 10 reps - Standing Bilateral Shoulder Internal Rotation AAROM with Dowel  - 1 x daily - 7 x weekly - 3 sets - 2 reps - 5 sec hold  hold - Standing Bilateral Low Shoulder Row with Anchored Resistance  - 1 x daily - 7 x weekly - 3 sets - 10 reps   PATIENT EDUCATION: Education details: HEP ; symptom management; progression of activity  Person educated: Patient Education method: Explanation, Demonstration, Tactile cues, Verbal cues, and Handouts Education comprehension: verbalized understanding, returned demonstration, verbal cues required, tactile cues required, and needs further education   HOME EXERCISE PROGRAM: Access Code: [email or web address] URL:  [email or web address] Date: 04/16/2022 Prepared by: Carolyne Littles  Exercises - Supine Shoulder Flexion Extension AAROM with Dowel  - 1 x daily - 7 x weekly - 3 sets - 10 reps - Sidelying Shoulder External Rotation  - 2 x daily - 7 x weekly - 3 sets - 10 reps - Standing Bilateral Shoulder Internal Rotation AAROM with Dowel  - 1 x daily - 7 x weekly - 3 sets - 2 reps - 5 sec hold  hold - Standing Bilateral Low Shoulder Row with Anchored Resistance  - 1 x daily - 7 x weekly - 3 sets - 10 reps  ASSESSMENT:  CLINICAL IMPRESSION: Patient is a 41 y.o. female who was seen today for physical therapy evaluation and treatment for right shoulder pain. She has repnoded well to the injection in her shoulder. Her primary limitation is internal rotation. She has some pain with forward flexion and extenral rotation. She would benefit from skilled therapy to improve functional use of her shoulder.    OBJECTIVE IMdecreased ROM, decreased strength, and painPAIRMENTS decreased ROM, decreased strength, and pain.   ACTIVITY LIMITATIONS carrying, lifting, and reach over head  PARTICIPATION LIMITATIONS: cleaning, driving, and shopping  PERSONAL FACTORS 3+ comorbidities: Dialysis, dialysis prot in both arm, past history of frozen shoulder   are also affecting patient's functional outcome.   REHAB POTENTIAL: Good  CLINICAL DECISION MAKING: Stable/uncomplicated decreasing pain since the injection   EVALUATION COMPLEXITY: Low   GOALS: Goals reviewed with patient? Yes  SHORT TERM GOALS: Target date: 04/30/2022  (Remove Blue Hyperlink)  Patient will have full edn reactive right shoulder flexion  Baseline: Goal status: INITIAL  2.  Patient will improve active IR to her gluteal without pain  Baseline:  Goal status: INITIAL  3.  Patient will e independent with basic HEP  Baseline:  Goal status: INITIAL   LONG TERM GOALS: Target date: 05/21/2022    Patient will reach behind her back to  an equal spot on the left without pain in order to perform ADL's  Baseline:  Goal status: INITIAL  2.  Patient will reach a 2lb with her right shoulder without pain  Baseline:  Goal status: INITIAL  3.  Patient will be independent with a complete exercise program for her shoudler  Baseline:  Goal status: INITIAL   PLAN: PT FREQUENCY: 2x/week  PT DURATION: 8 weeks  PLANNED INTERVENTIONS: Therapeutic exercises, Therapeutic activity, Gait training, Patient/Family education, Self Care, Joint mobilization, Aquatic Therapy, Dry Needling, Cryotherapy, Moist heat, Taping, Ultrasound, and Manual therapy  PLAN FOR NEXT SESSION: continue to progress exercises, manual therapy if needed. She has an upper trap spasm, consder reviewing the thera-cane; consider ABC; consider yellow band IR/ER Consider shoulder extension to neutral    Ronny Flurry, PT 04/22/2022, 10:38 PM

## 2022-04-23 ENCOUNTER — Encounter (HOSPITAL_BASED_OUTPATIENT_CLINIC_OR_DEPARTMENT_OTHER): Payer: Self-pay | Admitting: Physical Therapy

## 2022-04-23 ENCOUNTER — Ambulatory Visit (HOSPITAL_BASED_OUTPATIENT_CLINIC_OR_DEPARTMENT_OTHER): Payer: 59 | Admitting: Physical Therapy

## 2022-04-23 DIAGNOSIS — M25611 Stiffness of right shoulder, not elsewhere classified: Secondary | ICD-10-CM

## 2022-04-23 DIAGNOSIS — G8929 Other chronic pain: Secondary | ICD-10-CM

## 2022-04-23 DIAGNOSIS — M62838 Other muscle spasm: Secondary | ICD-10-CM

## 2022-04-23 DIAGNOSIS — M7501 Adhesive capsulitis of right shoulder: Secondary | ICD-10-CM | POA: Diagnosis not present

## 2022-04-30 ENCOUNTER — Ambulatory Visit (HOSPITAL_BASED_OUTPATIENT_CLINIC_OR_DEPARTMENT_OTHER): Payer: 59 | Attending: Orthopaedic Surgery | Admitting: Physical Therapy

## 2022-04-30 ENCOUNTER — Encounter (HOSPITAL_BASED_OUTPATIENT_CLINIC_OR_DEPARTMENT_OTHER): Payer: Self-pay | Admitting: Physical Therapy

## 2022-04-30 DIAGNOSIS — G8929 Other chronic pain: Secondary | ICD-10-CM | POA: Diagnosis present

## 2022-04-30 DIAGNOSIS — M25511 Pain in right shoulder: Secondary | ICD-10-CM | POA: Diagnosis present

## 2022-04-30 DIAGNOSIS — M62838 Other muscle spasm: Secondary | ICD-10-CM | POA: Diagnosis present

## 2022-04-30 DIAGNOSIS — M25611 Stiffness of right shoulder, not elsewhere classified: Secondary | ICD-10-CM | POA: Diagnosis present

## 2022-04-30 NOTE — Therapy (Signed)
OUTPATIENT PHYSICAL THERAPY TREATMENT NOTE  Patient Name: Cindy Luna MRN: [id number] DOB:1981/03/04, 41 y.o., female Today's Date: 04/09/2022   PT End of Session - 04/30/22 0943     Visit Number 3    Number of Visits 8    Date for PT Re-Evaluation 06/04/22    Authorization Type UHC MCR    PT Start Time 4193    PT Stop Time 1005    PT Time Calculation (min) 34 min    Activity Tolerance Patient tolerated treatment well    Behavior During Therapy [facility] for tasks assessed/performed                 Past Medical History:  Diagnosis Date   Anemia    Depression    DM (diabetes mellitus) ([place])    DUB (dysfunctional uterine bleeding) 06/24/2016   End stage renal disease ([place])    HLD (hyperlipidemia)    HTN (hypertension)    Lupus (HCC)    Ovarian cyst 09/01/2016   Renal disorder    Sleep apnea    Past Surgical History:  Procedure Laterality Date   AV FISTULA PLACEMENT  2016 right, 2009 left   left and right are both working    [place] Left    AV fistula stents, arm   TUBAL LIGATION  2010   Patient Active Problem List   Diagnosis Date Noted   Tinea versicolor 11/16/2019   Chronic constipation 11/16/2019   Eczema 11/16/2019   Environmental and seasonal allergies 07/17/2019   History of depression 07/17/2019   Patient is Jehovah's Witness 07/13/2019   Chest tightness 01/04/2019   Dysmenorrhea 05/03/2018   Class 3 severe obesity with serious comorbidity and body mass index (BMI) of 45.0 to 49.9 in adult ([place]) 04/18/2018   Tobacco use disorder 04/18/2018   Lymphadenopathy of head and neck 09/29/2017   OSA (obstructive sleep apnea) 12/10/2014   ESRD (end stage renal disease) on dialysis ([place]) 11/19/2014   Lupus ([place]) 11/19/2014    PCP: Grier Mitts MD   REFERRING PROVIDER: Dr Vanetta Mulders   REFERRING DIAG: Right shoulder pain   THERAPY DIAG:  Chronic right shoulder pain  Stiffness of right shoulder, not elsewhere  classified  Other muscle spasm  Rationale for Evaluation and Treatment Rehabilitation  ONSET DATE: > 6 months   SUBJECTIVE:                                                                                                                                                                                      SUBJECTIVE STATEMENT:  Pt states the shoulder feels okay today. She was  not excessively sore after last session. She is in less pain than last week.   Previous: Pt is not sure if it was due to therapy though had increased R shoulder pain after prior Rx.  Pt had to babysit after prior Rx and had increased pain lifting the baby.  Pt has increased pain and limitations with reaching behind back.  She has discomfort with lifting objects overhead including pots/pans.  R shoulder gives her the most pain but she does have L shoulder discomfort with mopping and lifting objects overhead.  Pt reports she has been performing her HEP though was performing ER in supine.  Pt received injections in bilat shoulders on 9/7.  Pt had increased R shoulder pain the day of and the day after the injection, but had much improved pain and sx's when the initial pain subsided.  Pt reports the injection didn't help the L shoulder much.  MD note indicated adhesive capsulitis on R shoulder and RTC tendinitis on L.  PT order indicated R shoulder ROM for adhesive capsulitis and L shoulder strengthening for RC tendinitis.      PERTINENT HISTORY: Renal Failure ( on Dialysis) and has shunts in UE ; Depression   PAIN:  Are you having pain? no: NPRS scale:0/10 Pain location:       Ant and post R shoulder   /  L shoulder Pain description: aching  Aggravating factors: reaching back  Relieving factors: the shot helped   PRECAUTIONS: None  WEIGHT BEARING RESTRICTIONS No  FALLS:  Has patient fallen in last 6 months? No  LIVING ENVIRONMENT: None  OCCUPATION: On disability   Hobbies: Nothing   PLOF:  Independent  PATIENT GOALS  Get it stronger   OBJECTIVE:   DIAGNOSTIC FINDINGS:  Shoulder x-ray 8/25 FINDINGS: There is no evidence of fracture or dislocation. There is no evidence of arthropathy or other focal bone abnormality. Soft tissues are unremarkable.   IMPRESSION: Negative.    TODAY'S TREATMENT:   Scaption pulley 2 min Wand ER 3s 10x  ER YTB walk outs 2x5 S/L ER 2x10 Wall push up 2x10 R UE standing rows with YTB 2x10 elbow at 45 UBE L1 3 min fwd 3 min retro  PATIENT EDUCATION: Education details: HEP and instructed pt in performing S/L'ing; symptom management; exercise form ; POC  Person educated: Patient Education method: Explanation, Demonstration, Tactile cues, Verbal cues, and Handouts Education comprehension: verbalized understanding, returned demonstration, verbal cues required, tactile cues required, and needs further education   HOME EXERCISE PROGRAM: Access Code: [email or web address] URL: [email or web address] Date: 04/16/2022 Prepared by: Carolyne Littles   ASSESSMENT:  CLINICAL IMPRESSION: Pt responded well today to higher volume of loading and intro to isometric cuff strengthening today without pain. Pt does complain of anterior shoulder tightness today with ext and horizontal ABD movements. Plan to continue with mobility, low load and moderate volume loading as tolerated. HEP not updated today in order to assess response to treatment at next. Pt should benefit from skilled PT services to address impairments and goals and to improve overall function.      OBJECTIVE IMdecreased ROM, decreased strength, and painPAIRMENTS decreased ROM, decreased strength, and pain.   ACTIVITY LIMITATIONS carrying, lifting, and reach over head  PARTICIPATION LIMITATIONS: cleaning, driving, and shopping  PERSONAL FACTORS 3+ comorbidities: Dialysis, dialysis prot in both arm, past history of frozen shoulder   are also affecting patient's functional outcome.    REHAB POTENTIAL: Good  CLINICAL DECISION MAKING: Stable/uncomplicated decreasing pain since the injection  EVALUATION COMPLEXITY: Low   GOALS: Goals reviewed with patient? Yes  SHORT TERM GOALS: Target date: 04/30/2022  (Remove Blue Hyperlink)  Patient will have full edn reactive right shoulder flexion  Baseline: Goal status: INITIAL  2.  Patient will improve active IR to her gluteal without pain  Baseline:  Goal status: INITIAL  3.  Patient will e independent with basic HEP  Baseline:  Goal status: INITIAL   LONG TERM GOALS: Target date: 05/21/2022    Patient will reach behind her back to an equal spot on the left without pain in order to perform ADL's  Baseline:  Goal status: INITIAL  2.  Patient will reach a 2lb with her right shoulder without pain  Baseline:  Goal status: INITIAL  3.  Patient will be independent with a complete exercise program for her shoudler  Baseline:  Goal status: INITIAL   PLAN: PT FREQUENCY: 2x/week  PT DURATION: 8 weeks  PLANNED INTERVENTIONS: Therapeutic exercises, Therapeutic activity, Gait training, Patient/Family education, Self Care, Joint mobilization, Aquatic Therapy, Dry Needling, Cryotherapy, Moist heat, Taping, Ultrasound, and Manual therapy  PLAN FOR NEXT SESSION: continue to progress exercises, manual therapy if needed. Consider AROM yellow band IR/ER ; update HEP per pt response/tolerance  Daleen Bo PT, DPT 04/30/22 10:12 AM

## 2022-05-01 ENCOUNTER — Other Ambulatory Visit (HOSPITAL_COMMUNITY): Payer: Self-pay

## 2022-05-07 ENCOUNTER — Ambulatory Visit (HOSPITAL_BASED_OUTPATIENT_CLINIC_OR_DEPARTMENT_OTHER): Payer: 59 | Admitting: Physical Therapy

## 2022-05-07 ENCOUNTER — Encounter (HOSPITAL_BASED_OUTPATIENT_CLINIC_OR_DEPARTMENT_OTHER): Payer: Self-pay | Admitting: Physical Therapy

## 2022-05-07 DIAGNOSIS — G8929 Other chronic pain: Secondary | ICD-10-CM

## 2022-05-07 DIAGNOSIS — M25511 Pain in right shoulder: Secondary | ICD-10-CM | POA: Diagnosis not present

## 2022-05-07 DIAGNOSIS — M62838 Other muscle spasm: Secondary | ICD-10-CM

## 2022-05-07 DIAGNOSIS — M25611 Stiffness of right shoulder, not elsewhere classified: Secondary | ICD-10-CM

## 2022-05-07 NOTE — Therapy (Signed)
OUTPATIENT PHYSICAL THERAPY TREATMENT NOTE  Patient Name: Cindy Luna MRN: [id number] DOB:[birth year], 41 y.o., female Today's Date: 04/09/2022   PT End of Session - 05/07/22 0947     Visit Number 4    Number of Visits 8    Date for PT Re-Evaluation 06/04/22    Authorization Type UHC MCR    PT Start Time 0933    PT Stop Time 1003    PT Time Calculation (min) 30 min    Activity Tolerance Patient tolerated treatment well    Behavior During Therapy [facility] for tasks assessed/performed                  Past Medical History:  Diagnosis Date   Anemia    Depression    DM (diabetes mellitus) ([place])    DUB (dysfunctional uterine bleeding) 06/24/2016   End stage renal disease ([place])    HLD (hyperlipidemia)    HTN (hypertension)    Lupus (HCC)    Ovarian cyst 09/01/2016   Renal disorder    Sleep apnea    Past Surgical History:  Procedure Laterality Date   AV FISTULA PLACEMENT  2016 right, 2009 left   left and right are both working    [place] Left    AV fistula stents, arm   TUBAL LIGATION  2010   Patient Active Problem List   Diagnosis Date Noted   Tinea versicolor 11/16/2019   Chronic constipation 11/16/2019   Eczema 11/16/2019   Environmental and seasonal allergies 07/17/2019   History of depression 07/17/2019   Patient is Jehovah's Witness 07/13/2019   Chest tightness 01/04/2019   Dysmenorrhea 05/03/2018   Class 3 severe obesity with serious comorbidity and body mass index (BMI) of 45.0 to 49.9 in adult ([place]) 04/18/2018   Tobacco use disorder 04/18/2018   Lymphadenopathy of head and neck 09/29/2017   OSA (obstructive sleep apnea) 12/10/2014   ESRD (end stage renal disease) on dialysis ([place]) 11/19/2014   Lupus ([place]) 11/19/2014    PCP: Grier Mitts MD   REFERRING PROVIDER: Dr Vanetta Mulders   REFERRING DIAG: Right shoulder pain   THERAPY DIAG:  Chronic right shoulder pain  Stiffness of right shoulder, not elsewhere  classified  Other muscle spasm  Rationale for Evaluation and Treatment Rehabilitation  ONSET DATE: > 6 months   SUBJECTIVE:                                                                                                                                                                                      SUBJECTIVE STATEMENT:  Pt states she has no issues with previous  sessions. Lifting with resistance causes pain. AROM does not recreate pain.    Previous: Pt is not sure if it was due to therapy though had increased R shoulder pain after prior Rx.  Pt had to babysit after prior Rx and had increased pain lifting the baby.  Pt has increased pain and limitations with reaching behind back.  She has discomfort with lifting objects overhead including pots/pans.  R shoulder gives her the most pain but she does have L shoulder discomfort with mopping and lifting objects overhead.  Pt reports she has been performing her HEP though was performing ER in supine.  Pt received injections in bilat shoulders on 9/7.  Pt had increased R shoulder pain the day of and the day after the injection, but had much improved pain and sx's when the initial pain subsided.  Pt reports the injection didn't help the L shoulder much.  MD note indicated adhesive capsulitis on R shoulder and RTC tendinitis on L.  PT order indicated R shoulder ROM for adhesive capsulitis and L shoulder strengthening for RC tendinitis.      PERTINENT HISTORY: Renal Failure ( on Dialysis) and has shunts in UE ; Depression   PAIN:  Are you having pain? no: NPRS scale:0/10 Pain location:       Ant and post R shoulder   /  L shoulder Pain description: aching  Aggravating factors: reaching back  Relieving factors: the shot helped   PRECAUTIONS: None  WEIGHT BEARING RESTRICTIONS No  FALLS:  Has patient fallen in last 6 months? No  LIVING ENVIRONMENT: None  OCCUPATION: On disability   Hobbies: Nothing   PLOF: Independent  PATIENT  GOALS  Get it stronger   OBJECTIVE:   DIAGNOSTIC FINDINGS:  Shoulder x-ray 8/25 FINDINGS: There is no evidence of fracture or dislocation. There is no evidence of arthropathy or other focal bone abnormality. Soft tissues are unremarkable.   IMPRESSION: Negative.    TODAY'S TREATMENT:   Scaption pulley 2 min  Standing YTB shoulder flexion 3x8 Bicep curls 3lbs 3x10 ER YTB walk outs 2x6 SA wall slide with YTB 2x8 Wall push up 10x  STM L infra   UBE L1 3 min fwd 3 min  PATIENT EDUCATION: Education details: anatomy, exercise progression, DOMS expectations, muscle firing,  envelope of function, HEP, POC  Person educated: Patient Education method: Explanation, Demonstration, Tactile cues, Verbal cues, and Handouts Education comprehension: verbalized understanding, returned demonstration, verbal cues required, tactile cues required, and needs further education   HOME EXERCISE PROGRAM: Access Code: [email or web address] URL: [email or web address] Date: 04/16/2022 Prepared by: Carolyne Littles   ASSESSMENT:  CLINICAL IMPRESSION: Pt with improvement in AROM today without pain. Pt report of pain with resistance suggests continued shoulder weakness so HEP modified today to focus on cuff and scapular strength bilaterally. Pt had L infra spasm during session with continued repetition likely due to port access on L. Pt advised to focus on strength as her motion is appropriate at this time. Pt to reduce frequency of visits and focus on independent strength. Continue with strength as tolerated. Pt should benefit from skilled PT services to address impairments and goals and to improve overall function.      OBJECTIVE IMdecreased ROM, decreased strength, and painPAIRMENTS decreased ROM, decreased strength, and pain.   ACTIVITY LIMITATIONS carrying, lifting, and reach over head  PARTICIPATION LIMITATIONS: cleaning, driving, and shopping  PERSONAL FACTORS 3+ comorbidities: Dialysis,  dialysis prot in both arm, past history of frozen shoulder   are also  affecting patient's functional outcome.   REHAB POTENTIAL: Good  CLINICAL DECISION MAKING: Stable/uncomplicated decreasing pain since the injection   EVALUATION COMPLEXITY: Low   GOALS: Goals reviewed with patient? Yes  SHORT TERM GOALS: Target date: 04/30/2022  (Remove Blue Hyperlink)  Patient will have full edn reactive right shoulder flexion  Baseline: Goal status: INITIAL  2.  Patient will improve active IR to her gluteal without pain  Baseline:  Goal status: INITIAL  3.  Patient will e independent with basic HEP  Baseline:  Goal status: INITIAL   LONG TERM GOALS: Target date: 05/21/2022    Patient will reach behind her back to an equal spot on the left without pain in order to perform ADL's  Baseline:  Goal status: INITIAL  2.  Patient will reach a 2lb with her right shoulder without pain  Baseline:  Goal status: INITIAL  3.  Patient will be independent with a complete exercise program for her shoudler  Baseline:  Goal status: INITIAL   PLAN: PT FREQUENCY: 2x/week  PT DURATION: 8 weeks  PLANNED INTERVENTIONS: Therapeutic exercises, Therapeutic activity, Gait training, Patient/Family education, Self Care, Joint mobilization, Aquatic Therapy, Dry Needling, Cryotherapy, Moist heat, Taping, Ultrasound, and Manual therapy  PLAN FOR NEXT SESSION: continue to progress exercises, manual therapy if needed. Consider AROM yellow band IR/ER ; update HEP per pt response/tolerance  Daleen Bo PT, DPT 05/07/22 10:07 AM

## 2022-05-28 ENCOUNTER — Encounter (HOSPITAL_BASED_OUTPATIENT_CLINIC_OR_DEPARTMENT_OTHER): Payer: 59 | Admitting: Physical Therapy

## 2022-06-02 ENCOUNTER — Other Ambulatory Visit (HOSPITAL_COMMUNITY): Payer: Self-pay

## 2022-06-03 ENCOUNTER — Other Ambulatory Visit (HOSPITAL_COMMUNITY): Payer: Self-pay

## 2022-06-04 ENCOUNTER — Other Ambulatory Visit (HOSPITAL_COMMUNITY): Payer: Self-pay

## 2022-06-27 ENCOUNTER — Other Ambulatory Visit (HOSPITAL_COMMUNITY): Payer: Self-pay

## 2022-07-28 ENCOUNTER — Other Ambulatory Visit (HOSPITAL_COMMUNITY): Payer: Self-pay

## 2022-07-28 MED ORDER — WEGOVY 1.7 MG/0.75ML ~~LOC~~ SOAJ
1.7000 mg | SUBCUTANEOUS | 3 refills | Status: DC
  Filled 2022-07-28: qty 3, 28d supply, fill #0

## 2022-08-03 ENCOUNTER — Other Ambulatory Visit (HOSPITAL_COMMUNITY): Payer: Self-pay

## 2022-08-03 MED ORDER — MOUNJARO 2.5 MG/0.5ML ~~LOC~~ SOAJ
SUBCUTANEOUS | 0 refills | Status: DC
  Filled 2022-08-03 – 2022-08-13 (×2): qty 2, 28d supply, fill #0

## 2022-08-04 ENCOUNTER — Ambulatory Visit (INDEPENDENT_AMBULATORY_CARE_PROVIDER_SITE_OTHER): Payer: 59

## 2022-08-04 VITALS — Ht 68.0 in | Wt 275.0 lb

## 2022-08-04 DIAGNOSIS — Z0000 Encounter for general adult medical examination without abnormal findings: Secondary | ICD-10-CM

## 2022-08-04 NOTE — Patient Instructions (Addendum)
[name] , Thank you for taking time to come for your Medicare Wellness Visit. I appreciate your ongoing commitment to your health goals. Please review the following plan we discussed and let me know if I can assist you in the future.   These are the goals we discussed:  Goals       Exercise 3x per week (30 min per time)      Lose weight (pt-stated)        This is a list of the screening recommended for you and due dates:  Health Maintenance  Topic Date Due   DTaP/Tdap/Td vaccine (1 - Tdap) Never done   Pap Smear  04/18/2021   COVID-19 Vaccine (5 - 2023-24 season) 08/20/2022*   Flu Shot  10/25/2022*   Hepatitis C Screening: USPSTF Recommendation to screen - Ages 18-79 yo.  08/05/2023*   Medicare Annual Wellness Visit  08/05/2023   HIV Screening  Completed   HPV Vaccine  Aged Out  *Topic was postponed. The date shown is not the original due date.    Advanced directives: Advance directive discussed with you today. Even though you declined this today, please call our office should you change your mind, and we can give you the proper paperwork for you to fill out.   Conditions/risks identified: None  Next appointment: Follow up in one year for your annual wellness visit.    Preventive Care 40-64 Years, Female Preventive care refers to lifestyle choices and visits with your health care provider that can promote health and wellness. What does preventive care include? A yearly physical exam. This is also called an annual well check. Dental exams once or twice a year. Routine eye exams. Ask your health care provider how often you should have your eyes checked. Personal lifestyle choices, including: Daily care of your teeth and gums. Regular physical activity. Eating a healthy diet. Avoiding tobacco and drug use. Limiting alcohol use. Practicing safe sex. Taking low-dose aspirin daily starting at age 39. Taking vitamin and mineral supplements as recommended by your health care  provider. What happens during an annual well check? The services and screenings done by your health care provider during your annual well check will depend on your age, overall health, lifestyle risk factors, and family history of disease. Counseling  Your health care provider may ask you questions about your: Alcohol use. Tobacco use. Drug use. Emotional well-being. Home and relationship well-being. Sexual activity. Eating habits. Work and work Statistician. Method of birth control. Menstrual cycle. Pregnancy history. Screening  You may have the following tests or measurements: Height, weight, and BMI. Blood pressure. Lipid and cholesterol levels. These may be checked every 5 years, or more frequently if you are over 70 years old. Skin check. Lung cancer screening. You may have this screening every year starting at age 23 if you have a 30-pack-year history of smoking and currently smoke or have quit within the past 15 years. Fecal occult blood test (FOBT) of the stool. You may have this test every year starting at age 67. Flexible sigmoidoscopy or colonoscopy. You may have a sigmoidoscopy every 5 years or a colonoscopy every 10 years starting at age 73. Hepatitis C blood test. Hepatitis B blood test. Sexually transmitted disease (STD) testing. Diabetes screening. This is done by checking your blood sugar (glucose) after you have not eaten for a while (fasting). You may have this done every 1-3 years. Mammogram. This may be done every 1-2 years. Talk to your health care provider  about when you should start having regular mammograms. This may depend on whether you have a family history of breast cancer. BRCA-related cancer screening. This may be done if you have a family history of breast, ovarian, tubal, or peritoneal cancers. Pelvic exam and Pap test. This may be done every 3 years starting at age 39. Starting at age 36, this may be done every 5 years if you have a Pap test in  combination with an HPV test. Bone density scan. This is done to screen for osteoporosis. You may have this scan if you are at high risk for osteoporosis. Discuss your test results, treatment options, and if necessary, the need for more tests with your health care provider. Vaccines  Your health care provider may recommend certain vaccines, such as: Influenza vaccine. This is recommended every year. Tetanus, diphtheria, and acellular pertussis (Tdap, Td) vaccine. You may need a Td booster every 10 years. Zoster vaccine. You may need this after age 48. Pneumococcal 13-valent conjugate (PCV13) vaccine. You may need this if you have certain conditions and were not previously vaccinated. Pneumococcal polysaccharide (PPSV23) vaccine. You may need one or two doses if you smoke cigarettes or if you have certain conditions. Talk to your health care provider about which screenings and vaccines you need and how often you need them. This information is not intended to replace advice given to you by your health care provider. Make sure you discuss any questions you have with your health care provider. Document Released: 08/09/2015 Document Revised: 04/01/2016 Document Reviewed: 05/14/2015 Elsevier Interactive Patient Education  2017 [employer] [facility] Prevention in the Home Falls can cause injuries. They can happen to people of all ages. There are many things you can do to make your home safe and to help prevent falls. What can I do on the outside of my home? Regularly fix the edges of walkways and driveways and fix any cracks. Remove anything that might make you trip as you walk through a door, such as a raised step or threshold. Trim any bushes or trees on the path to your home. Use bright outdoor lighting. Clear any walking paths of anything that might make someone trip, such as rocks or tools. Regularly check to see if handrails are loose or broken. Make sure that both sides of any steps have  handrails. Any raised decks and porches should have guardrails on the edges. Have any leaves, snow, or ice cleared regularly. Use sand or salt on walking paths during winter. Clean up any spills in your garage right away. This includes oil or grease spills. What can I do in the bathroom? Use night lights. Install grab bars by the toilet and in the tub and shower. Do not use towel bars as grab bars. Use non-skid mats or decals in the tub or shower. If you need to sit down in the shower, use a plastic, non-slip stool. Keep the floor dry. Clean up any water that spills on the floor as soon as it happens. Remove soap buildup in the tub or shower regularly. Attach bath mats securely with double-sided non-slip rug tape. Do not have throw rugs and other things on the floor that can make you trip. What can I do in the bedroom? Use night lights. Make sure that you have a light by your bed that is easy to reach. Do not use any sheets or blankets that are too big for your bed. They should not hang down onto the floor.  Have a firm chair that has side arms. You can use this for support while you get dressed. Do not have throw rugs and other things on the floor that can make you trip. What can I do in the kitchen? Clean up any spills right away. Avoid walking on wet floors. Keep items that you use a lot in easy-to-reach places. If you need to reach something above you, use a strong step stool that has a grab bar. Keep electrical cords out of the way. Do not use floor polish or wax that makes floors slippery. If you must use wax, use non-skid floor wax. Do not have throw rugs and other things on the floor that can make you trip. What can I do with my stairs? Do not leave any items on the stairs. Make sure that there are handrails on both sides of the stairs and use them. Fix handrails that are broken or loose. Make sure that handrails are as long as the stairways. Check any carpeting to make sure  that it is firmly attached to the stairs. Fix any carpet that is loose or worn. Avoid having throw rugs at the top or bottom of the stairs. If you do have throw rugs, attach them to the floor with carpet tape. Make sure that you have a light switch at the top of the stairs and the bottom of the stairs. If you do not have them, ask someone to add them for you. What else can I do to help prevent falls? Wear shoes that: Do not have high heels. Have rubber bottoms. Are comfortable and fit you well. Are closed at the toe. Do not wear sandals. If you use a stepladder: Make sure that it is fully opened. Do not climb a closed stepladder. Make sure that both sides of the stepladder are locked into place. Ask someone to hold it for you, if possible. Clearly mark and make sure that you can see: Any grab bars or handrails. First and last steps. Where the edge of each step is. Use tools that help you move around (mobility aids) if they are needed. These include: Canes. Walkers. Scooters. Crutches. Turn on the lights when you go into a dark area. Replace any light bulbs as soon as they burn out. Set up your furniture so you have a clear path. Avoid moving your furniture around. If any of your floors are uneven, fix them. If there are any pets around you, be aware of where they are. Review your medicines with your doctor. Some medicines can make you feel dizzy. This can increase your chance of falling. Ask your doctor what other things that you can do to help prevent falls. This information is not intended to replace advice given to you by your health care provider. Make sure you discuss any questions you have with your health care provider. Document Released: 05/09/2009 Document Revised: 12/19/2015 Document Reviewed: 08/17/2014 Elsevier Interactive Patient Education  2017 [employer].

## 2022-08-04 NOTE — Progress Notes (Signed)
Subjective:   Cindy Luna is a 42 y.o. female who presents for Medicare Annual (Subsequent) preventive examination.  Review of Systems    Virtual Visit via Telephone Note  I connected with  Cindy Luna on 08/04/22 at 10:00 AM EST by telephone and verified that I am speaking with the correct person using two identifiers.  Location: Patient: Home Provider: Office Persons participating in the virtual visit: patient/Nurse Health Advisor   I discussed the limitations, risks, security and privacy concerns of performing an evaluation and management service by telephone and the availability of in person appointments. The patient expressed understanding and agreed to proceed.  Interactive audio and video telecommunications were attempted between this nurse and patient, however failed, due to patient having technical difficulties OR patient did not have access to video capability.  We continued and completed visit with audio only.  Some vital signs may be absent or patient reported.   Cindy Peaches, LPN  Cardiac Risk Factors include: advanced age (>41men, >45 women);Other (see comment), Risk factor comments: Dx Lupus     Objective:    Today's Vitals   08/04/22 0957  Weight: 275 lb (124.7 kg)  Height: 5\' 8"  (1.727 m)   Body mass index is 41.81 kg/m.     08/04/2022   10:04 AM 04/09/2022    9:05 PM 07/31/2021   10:09 AM 04/08/2021   10:19 AM 07/30/2020   10:01 AM 04/29/2018    5:54 PM 04/22/2018    9:45 PM  Advanced Directives  Does Patient Have a Medical Advance Directive? No No No No No No No  Would patient like information on creating a medical advance directive? No - Patient declined No - Patient declined No - Patient declined No - Patient declined No - Patient declined  No - Patient declined    Current Medications (verified) Outpatient Encounter Medications as of 08/04/2022  Medication Sig   cetirizine (ZYRTEC) 10 MG tablet Take 10 mg by mouth daily.   cinacalcet  (SENSIPAR) 90 MG tablet Take 90 mg by mouth daily.   docusate sodium (COLACE) 100 MG capsule Take 100 mg by mouth 2 (two) times daily. (Patient not taking: Reported on 04/09/2022)   esomeprazole (NEXIUM) 40 MG capsule Take 40 mg by mouth daily.   linaclotide (LINZESS) 145 MCG CAPS capsule Take 145 mcg by mouth as needed.   norethindrone (AYGESTIN) 5 MG tablet Take 1 tablet by mouth daily.   Semaglutide, 1 MG/DOSE, (OZEMPIC, 1 MG/DOSE,) 4 MG/3ML SOPN Inject 1 mg into the skin once a week (Patient not taking: Reported on 04/09/2022)   Semaglutide, 1 MG/DOSE, (OZEMPIC, 1 MG/DOSE,) 4 MG/3ML SOPN Inject 2 mg into to skin once a week (Patient not taking: Reported on 04/09/2022)   Semaglutide, 2 MG/DOSE, (OZEMPIC, 2 MG/DOSE,) 8 MG/3ML SOPN Inject 2 mg into the skin once a week   Semaglutide, 2 MG/DOSE, (OZEMPIC, 2 MG/DOSE,) 8 MG/3ML SOPN Inject 2 mg subcutaneously once a week   Semaglutide-Weight Management (WEGOVY) 1.7 MG/0.75ML SOAJ Inject 1.7 mg into the skin once a week.   sevelamer carbonate (RENVELA) 800 MG tablet Take 800 mg by mouth. Take 5 tablets three times daily with meal and 2 tablets two times with snack   SUPER B COMPLEX/C PO    tirzepatide (MOUNJARO) 2.5 MG/0.5ML Pen Inject 2.5 mg under the skin once a week   triamcinolone cream (KENALOG) 0.1 % Apply 1 application topically 2 (two) times daily.   Zinc 50 MG TABS    [  DISCONTINUED] cinacalcet (SENSIPAR) 60 MG tablet Take 60 mg by mouth daily with supper.   [DISCONTINUED] hydrocortisone cream 0.5 % Apply 1 application topically 2 (two) times daily.   [DISCONTINUED] lactobacillus acidophilus (BACID) TABS tablet Take 2 tablets by mouth 3 (three) times daily.   [DISCONTINUED] linaclotide (LINZESS) 72 MCG capsule Take 72 mcg by mouth daily before breakfast.   [DISCONTINUED] methocarbamol (ROBAXIN) 500 MG tablet Take 1 tablet (500 mg total) by mouth every 6 (six) hours as needed for muscle spasms.   [DISCONTINUED] pantoprazole (PROTONIX) 40 MG  tablet Take 40 mg by mouth daily.   [DISCONTINUED] Semaglutide, 1 MG/DOSE, (OZEMPIC, 1 MG/DOSE,) 4 MG/3ML SOPN Inject 2 mg into the skin once a week (Patient not taking: Reported on 04/09/2022)   [DISCONTINUED] Semaglutide,0.25 or 0.5MG /DOS, (OZEMPIC, 0.25 OR 0.5 MG/DOSE,) 2 MG/1.5ML SOPN Inject into the skin. (Patient not taking: Reported on 04/09/2022)   [DISCONTINUED] topiramate (TOPAMAX) 100 MG tablet Take 1 tablet (100 mg total) by mouth at bedtime.   No facility-administered encounter medications on file as of 08/04/2022.    Allergies (verified) Azithromycin, Benadryl [diphenhydramine hcl], Doxycycline hyclate, Vancomycin, and Adhesive [tape]   History: Past Medical History:  Diagnosis Date   Anemia    Depression    DM (diabetes mellitus) (HCC)    DUB (dysfunctional uterine bleeding) 06/24/2016   End stage renal disease (HCC)    HLD (hyperlipidemia)    HTN (hypertension)    Lupus (HCC)    Ovarian cyst 09/01/2016   Renal disorder    Sleep apnea    Past Surgical History:  Procedure Laterality Date   AV FISTULA PLACEMENT  2016 right, 2009 left   left and right are both working    CESAREAN SECTION     OTHER SURGICAL HISTORY Left    AV fistula stents, arm   TUBAL LIGATION  2010   Family History  Problem Relation Age of Onset   Hypertension Mother    Diabetes Mother    Autism Daughter    Thyroid disease Sister    Colon cancer Maternal Grandmother    Prostate cancer Maternal Grandfather    Breast cancer Maternal Aunt    Diabetes Other        both sides of the fam   Hypertension Other        both sides of the fam   Social History   Socioeconomic History   Marital status: Single    Spouse name: Not on file   Number of children: 1   Years of education: Not on file   Highest education level: Bachelor's degree (e.g., BA, AB, BS)  Occupational History   Not on file  Tobacco Use   Smoking status: Former    Types: Cigars    Start date: 2018    Quit date: 07/2018     Years since quitting: 4.0   Smokeless tobacco: Never  Substance and Sexual Activity   Alcohol use: No   Drug use: No   Sexual activity: Yes    Partners: Male    Birth control/protection: None  Other Topics Concern   Not on file  Social History Narrative   ** Merged History Encounter **       Level of education: college    Employment: unemployed    Transportation: car    Exercise: no   Housing situation: apt   Relationships (safe): yes   Contact for message (voicemail): [phone]   Social Determinants of Health   Financial Resource Strain: Low  Risk  (08/04/2022)   Overall Financial Resource Strain (CARDIA)    Difficulty of Paying Living Expenses: Not hard at all  Food Insecurity: No Food Insecurity (08/04/2022)   Hunger Vital Sign    Worried About Running Out of Food in the Last Year: Never true    Ran Out of Food in the Last Year: Never true  Transportation Needs: No Transportation Needs (08/04/2022)   PRAPARE - Hydrologist (Medical): No    Lack of Transportation (Non-Medical): No  Physical Activity: Inactive (08/04/2022)   Exercise Vital Sign    Days of Exercise per Week: 0 days    Minutes of Exercise per Session: 0 min  Stress: No Stress Concern Present (08/04/2022)   [facility]    Feeling of Stress : Not at all  Social Connections: Moderately Integrated (08/04/2022)   Social Connection and Isolation Panel [NHANES]    Frequency of Communication with Friends and Family: More than three times a week    Frequency of Social Gatherings with Friends and Family: More than three times a week    Attends Religious Services: More than 4 times per year    Active Member of [employer] or Organizations: Yes    Attends Music therapist: More than 4 times per year    Marital Status: Never married    Tobacco Counseling Counseling given: Not Answered   Clinical Intake:  Pre-visit  preparation completed: Yes  Pain : No/denies pain     BMI - recorded: 41.81 Nutritional Status: BMI > 30  Obese Nutritional Risks: None Diabetes: No  How often do you need to have someone help you when you read instructions, pamphlets, or other written materials from your doctor or pharmacy?: 1 - Never  Diabetic?  No  Interpreter Needed?: No  Information entered by :: Rolene Arbour LPN   Activities of Daily Living    08/04/2022   10:02 AM 08/02/2022    8:37 PM  In your present state of health, do you have any difficulty performing the following activities:  Hearing? 0 0  Vision? 0 0  Difficulty concentrating or making decisions? 0 0  Walking or climbing stairs? 0 0  Dressing or bathing? 0 0  Doing errands, shopping? 0 0  Preparing Food and eating ? N N  Using the Toilet? N N  In the past six months, have you accidently leaked urine? N N  Do you have problems with loss of bowel control? N N  Managing your Medications? N N  Managing your Finances? N N  Housekeeping or managing your Housekeeping? N N    Patient Care Team: Billie Ruddy, MD as PCP - General (Family Medicine)  Indicate any recent Medical Services you may have received from other than [facility] providers in the past year (date may be approximate).     Assessment:   This is a routine wellness examination for Cindy Luna.  Hearing/Vision screen Hearing Screening - Comments:: Denies hearing difficulties   Vision Screening - Comments:: Wears rx glasses - up to date with routine eye exams with  [facility]  Dietary issues and exercise activities discussed: Exercise limited by: None identified   Goals Addressed               This Visit's Progress     Lose weight (pt-stated)         Depression Screen    08/04/2022   10:02 AM  04/16/2022    9:16 AM 07/31/2021   10:03 AM 07/30/2020   10:03 AM 07/13/2019   11:43 AM 12/01/2018    3:19 PM 04/18/2018    3:59 PM  PHQ 2/9 Scores  PHQ - 2 Score 0 0 0 0 0 0 0   PHQ- 9 Score  5  0 3      Fall Risk    08/04/2022   10:03 AM 08/02/2022    8:37 PM 04/16/2022    9:16 AM 07/31/2021   10:05 AM 07/30/2020   10:02 AM  Fall Risk   Falls in the past year? 0 0 0 0 0  Number falls in past yr: 0 0 0 0 0  Injury with Fall? 0 0 0 0 0  Risk for fall due to : No Fall Risks  No Fall Risks    Follow up Falls prevention discussed  Falls evaluation completed  Falls evaluation completed;Falls prevention discussed    FALL RISK PREVENTION PERTAINING TO THE HOME:  Any stairs in or around the home? Yes  If so, are there any without handrails? No  Home free of loose throw rugs in walkways, pet beds, electrical cords, etc? Yes  Adequate lighting in your home to reduce risk of falls? Yes   ASSISTIVE DEVICES UTILIZED TO PREVENT FALLS:  Life alert? No  Use of a cane, walker or w/c? No  Grab bars in the bathroom? Yes  Shower chair or bench in shower? No  Elevated toilet seat or a handicapped toilet? No   TIMED UP AND GO:   Was the test performed? No . Audio Visit    Cognitive Function:        08/04/2022   10:04 AM 07/31/2021   10:07 AM  6CIT Screen  What Year? 0 points 0 points  What month? 0 points 0 points  What time? 0 points 0 points  Count back from 20 0 points 0 points  Months in reverse 0 points 0 points  Repeat phrase 0 points 0 points  Total Score 0 points 0 points    Immunizations Immunization History  Administered Date(s) Administered   Hepatitis B, PED/ADOLESCENT 10/14/2012, 12/16/2012, 11/09/2014, 12/10/2014, 01/09/2015, 05/13/2015   Hepatitis B, adult 10/14/2012, 12/16/2012, 11/09/2014, 12/10/2014, 01/09/2015, 05/13/2015   PFIZER(Purple Top)SARS-COV-2 Vaccination 01/02/2020, 01/23/2020, 05/07/2020, 07/24/2021    TDAP status: Due, Education has been provided regarding the importance of this vaccine. Advised may receive this vaccine at local pharmacy or Health Dept. Aware to provide a copy of the vaccination record if obtained from local  pharmacy or Health Dept. Verbalized acceptance and understanding.  Flu Vaccine status: Declined, Education has been provided regarding the importance of this vaccine but patient still declined. Advised may receive this vaccine at local pharmacy or Health Dept. Aware to provide a copy of the vaccination record if obtained from local pharmacy or Health Dept. Verbalized acceptance and understanding.    Covid-19 vaccine status: Completed vaccines  Qualifies for Shingles Vaccine? No   Zostavax completed No   Shingrix Completed?: No.    Education has been provided regarding the importance of this vaccine. Patient has been advised to call insurance company to determine out of pocket expense if they have not yet received this vaccine. Advised may also receive vaccine at local pharmacy or Health Dept. Verbalized acceptance and understanding.  Screening Tests Health Maintenance  Topic Date Due   DTaP/Tdap/Td (1 - Tdap) Never done   PAP SMEAR-Modifier  04/18/2021   COVID-19 Vaccine (5 -  2023-24 season) 08/20/2022 (Originally 03/27/2022)   INFLUENZA VACCINE  10/25/2022 (Originally 02/24/2022)   Hepatitis C Screening  08/05/2023 (Originally 06/29/2000)   Medicare Annual Wellness (AWV)  08/05/2023   HIV Screening  Completed   HPV VACCINES  Aged Out    Health Maintenance  Health Maintenance Due  Topic Date Due   DTaP/Tdap/Td (1 - Tdap) Never done   PAP SMEAR-Modifier  04/18/2021          Lung Cancer Screening: (Low Dose CT Chest recommended if Age 37-80 years, 30 pack-year currently smoking OR have quit w/in 15years.) does not qualify.     Additional Screening:  Hepatitis C Screening: does qualify; Completed Deferred  Vision Screening: Recommended annual ophthalmology exams for early detection of glaucoma and other disorders of the eye. Is the patient up to date with their annual eye exam?  Yes  Who is the provider or what is the name of the office in which the patient attends annual eye  exams? [facility] If pt is not established with a provider, would they like to be referred to a provider to establish care? No .   Dental Screening: Recommended annual dental exams for proper oral hygiene  Community Resource Referral / Chronic Care Management:  CRR required this visit?  No   CCM required this visit?  No      Plan:     I have personally reviewed and noted the following in the patient's chart:   Medical and social history Use of alcohol, tobacco or illicit drugs  Current medications and supplements including opioid prescriptions. Patient is not currently taking opioid prescriptions. Functional ability and status Nutritional status Physical activity Advanced directives List of other physicians Hospitalizations, surgeries, and ER visits in previous 12 months Vitals Screenings to include cognitive, depression, and falls Referrals and appointments  In addition, I have reviewed and discussed with patient certain preventive protocols, quality metrics, and best practice recommendations. A written personalized care plan for preventive services as well as general preventive health recommendations were provided to patient.     Cindy Peaches, LPN   02/27/6628   Nurse Notes: Patient due Hep-C Screening

## 2022-08-07 ENCOUNTER — Other Ambulatory Visit (HOSPITAL_COMMUNITY): Payer: Self-pay

## 2022-08-10 ENCOUNTER — Other Ambulatory Visit (HOSPITAL_COMMUNITY): Payer: Self-pay

## 2022-08-11 ENCOUNTER — Other Ambulatory Visit (HOSPITAL_COMMUNITY): Payer: Self-pay

## 2022-08-13 ENCOUNTER — Other Ambulatory Visit (HOSPITAL_COMMUNITY): Payer: Self-pay

## 2022-08-26 ENCOUNTER — Other Ambulatory Visit (HOSPITAL_COMMUNITY): Payer: Self-pay

## 2022-08-26 MED ORDER — MOUNJARO 5 MG/0.5ML ~~LOC~~ SOAJ
SUBCUTANEOUS | 11 refills | Status: DC
  Filled 2022-08-26: qty 2, 28d supply, fill #0
  Filled 2022-10-22: qty 2, 28d supply, fill #1
  Filled 2023-01-21 (×2): qty 2, 28d supply, fill #2
  Filled 2023-06-26: qty 2, 28d supply, fill #3

## 2022-09-16 ENCOUNTER — Other Ambulatory Visit (HOSPITAL_COMMUNITY): Payer: Self-pay

## 2022-09-16 MED ORDER — MOUNJARO 7.5 MG/0.5ML ~~LOC~~ SOAJ
7.5000 mg | SUBCUTANEOUS | 11 refills | Status: DC
  Filled 2022-09-16: qty 2, 28d supply, fill #0

## 2022-09-17 ENCOUNTER — Other Ambulatory Visit (HOSPITAL_COMMUNITY): Payer: Self-pay

## 2022-09-30 ENCOUNTER — Other Ambulatory Visit (HOSPITAL_COMMUNITY): Payer: Self-pay

## 2022-09-30 MED ORDER — MOUNJARO 10 MG/0.5ML ~~LOC~~ SOAJ
10.0000 mg | SUBCUTANEOUS | 11 refills | Status: DC
  Filled 2022-09-30: qty 2, 28d supply, fill #0
  Filled 2022-10-22 – 2023-01-21 (×4): qty 2, 28d supply, fill #1
  Filled 2023-06-26: qty 2, 28d supply, fill #2
  Filled 2023-09-09: qty 2, 28d supply, fill #3

## 2022-10-01 ENCOUNTER — Other Ambulatory Visit (HOSPITAL_COMMUNITY): Payer: Self-pay

## 2022-10-21 ENCOUNTER — Other Ambulatory Visit: Payer: Self-pay

## 2022-10-21 ENCOUNTER — Other Ambulatory Visit (HOSPITAL_COMMUNITY): Payer: Self-pay

## 2022-10-21 MED ORDER — MOUNJARO 12.5 MG/0.5ML ~~LOC~~ SOAJ
SUBCUTANEOUS | 11 refills | Status: DC
  Filled 2022-10-21 – 2022-10-24 (×2): qty 2, 28d supply, fill #0
  Filled 2022-12-29: qty 2, 28d supply, fill #1

## 2022-10-22 ENCOUNTER — Other Ambulatory Visit (HOSPITAL_COMMUNITY): Payer: Self-pay

## 2022-10-26 ENCOUNTER — Other Ambulatory Visit (HOSPITAL_COMMUNITY): Payer: Self-pay

## 2022-11-03 ENCOUNTER — Other Ambulatory Visit (HOSPITAL_COMMUNITY): Payer: Self-pay

## 2022-11-25 ENCOUNTER — Other Ambulatory Visit (HOSPITAL_COMMUNITY): Payer: Self-pay

## 2022-11-25 ENCOUNTER — Other Ambulatory Visit: Payer: Self-pay | Admitting: Family Medicine

## 2022-11-28 ENCOUNTER — Other Ambulatory Visit (HOSPITAL_COMMUNITY): Payer: Self-pay

## 2022-12-01 ENCOUNTER — Other Ambulatory Visit (HOSPITAL_COMMUNITY): Payer: Self-pay

## 2022-12-02 ENCOUNTER — Other Ambulatory Visit (HOSPITAL_COMMUNITY): Payer: Self-pay

## 2022-12-02 MED ORDER — MOUNJARO 15 MG/0.5ML ~~LOC~~ SOAJ
SUBCUTANEOUS | 11 refills | Status: DC
  Filled 2022-12-02: qty 2, 28d supply, fill #0
  Filled 2022-12-26 – 2023-02-10 (×2): qty 2, 28d supply, fill #1
  Filled 2023-03-15: qty 2, 28d supply, fill #2
  Filled 2023-04-08: qty 2, 28d supply, fill #3
  Filled 2023-05-06: qty 2, 28d supply, fill #4
  Filled 2023-06-03: qty 2, 28d supply, fill #5
  Filled 2023-06-26: qty 2, 28d supply, fill #6
  Filled 2023-07-19: qty 2, 28d supply, fill #7
  Filled 2023-08-23 – 2023-09-22 (×2): qty 2, 28d supply, fill #8

## 2022-12-25 ENCOUNTER — Other Ambulatory Visit (HOSPITAL_COMMUNITY): Payer: Self-pay

## 2022-12-29 ENCOUNTER — Other Ambulatory Visit (HOSPITAL_COMMUNITY): Payer: Self-pay

## 2023-01-13 ENCOUNTER — Other Ambulatory Visit (HOSPITAL_COMMUNITY): Payer: Self-pay

## 2023-01-13 MED ORDER — FAMOTIDINE 20 MG PO TABS
20.0000 mg | ORAL_TABLET | ORAL | 11 refills | Status: DC
  Filled 2023-01-13: qty 30, 30d supply, fill #0
  Filled 2023-02-10: qty 30, 30d supply, fill #1
  Filled 2023-03-15: qty 30, 30d supply, fill #2
  Filled 2023-04-20: qty 30, 30d supply, fill #3
  Filled 2023-05-16: qty 30, 30d supply, fill #4
  Filled 2023-06-16: qty 30, 30d supply, fill #5
  Filled 2023-07-17 – 2023-07-19 (×2): qty 30, 30d supply, fill #6
  Filled 2023-08-16: qty 30, 30d supply, fill #7
  Filled 2023-09-12: qty 30, 30d supply, fill #8
  Filled 2023-10-12: qty 30, 30d supply, fill #9
  Filled 2023-12-17: qty 30, 30d supply, fill #10
  Filled 2024-01-11 (×2): qty 30, 30d supply, fill #11

## 2023-01-21 ENCOUNTER — Other Ambulatory Visit: Payer: Self-pay

## 2023-01-21 ENCOUNTER — Other Ambulatory Visit (HOSPITAL_COMMUNITY): Payer: Self-pay

## 2023-03-16 ENCOUNTER — Other Ambulatory Visit: Payer: Self-pay

## 2023-04-08 ENCOUNTER — Other Ambulatory Visit (HOSPITAL_COMMUNITY): Payer: Self-pay

## 2023-05-11 ENCOUNTER — Other Ambulatory Visit (HOSPITAL_COMMUNITY): Payer: Self-pay

## 2023-06-17 ENCOUNTER — Encounter: Payer: Self-pay | Admitting: Nephrology

## 2023-06-17 ENCOUNTER — Other Ambulatory Visit: Payer: Self-pay | Admitting: Nephrology

## 2023-06-17 DIAGNOSIS — R221 Localized swelling, mass and lump, neck: Secondary | ICD-10-CM

## 2023-06-17 DIAGNOSIS — R220 Localized swelling, mass and lump, head: Secondary | ICD-10-CM

## 2023-06-22 ENCOUNTER — Other Ambulatory Visit: Payer: Self-pay | Admitting: Nephrology

## 2023-06-22 DIAGNOSIS — I871 Compression of vein: Secondary | ICD-10-CM

## 2023-06-23 ENCOUNTER — Ambulatory Visit
Admission: RE | Admit: 2023-06-23 | Discharge: 2023-06-23 | Disposition: A | Discharge status: Home / Self Care | Payer: 59 | Source: Ambulatory Visit | Attending: Nephrology | Admitting: Nephrology

## 2023-06-23 DIAGNOSIS — R220 Localized swelling, mass and lump, head: Secondary | ICD-10-CM

## 2023-06-23 DIAGNOSIS — I871 Compression of vein: Secondary | ICD-10-CM

## 2023-06-23 DIAGNOSIS — R221 Localized swelling, mass and lump, neck: Secondary | ICD-10-CM

## 2023-06-23 MED ORDER — IOPAMIDOL (ISOVUE-300) INJECTION 61%
100.0000 mL | Freq: Once | INTRAVENOUS | Status: AC | PRN
Start: 2023-06-23 — End: 2023-06-23
  Administered 2023-06-23: 100 mL via INTRAVENOUS

## 2023-06-26 ENCOUNTER — Other Ambulatory Visit (HOSPITAL_COMMUNITY): Payer: Self-pay

## 2023-06-26 ENCOUNTER — Other Ambulatory Visit: Payer: Self-pay

## 2023-06-28 ENCOUNTER — Other Ambulatory Visit (HOSPITAL_COMMUNITY): Payer: Self-pay

## 2023-06-28 MED ORDER — MOUNJARO 5 MG/0.5ML ~~LOC~~ SOAJ
5.0000 mg | SUBCUTANEOUS | 11 refills | Status: DC
  Filled 2023-06-28 – 2023-10-14 (×2): qty 2, 28d supply, fill #0

## 2023-07-03 ENCOUNTER — Other Ambulatory Visit (HOSPITAL_COMMUNITY): Payer: Self-pay

## 2023-07-15 ENCOUNTER — Other Ambulatory Visit: Payer: Self-pay | Admitting: Nephrology

## 2023-07-15 DIAGNOSIS — N289 Disorder of kidney and ureter, unspecified: Secondary | ICD-10-CM

## 2023-07-19 ENCOUNTER — Other Ambulatory Visit (HOSPITAL_COMMUNITY): Payer: Self-pay

## 2023-07-19 ENCOUNTER — Inpatient Hospital Stay
Admission: RE | Admit: 2023-07-19 | Discharge: 2023-07-19 | Discharge status: Home / Self Care | Payer: 59 | Source: Ambulatory Visit | Attending: Nephrology

## 2023-07-19 DIAGNOSIS — N289 Disorder of kidney and ureter, unspecified: Secondary | ICD-10-CM

## 2023-08-12 ENCOUNTER — Ambulatory Visit: Payer: 59 | Admitting: Family Medicine

## 2023-08-12 ENCOUNTER — Encounter: Payer: Self-pay | Admitting: Family Medicine

## 2023-08-12 DIAGNOSIS — Z0000 Encounter for general adult medical examination without abnormal findings: Secondary | ICD-10-CM | POA: Diagnosis not present

## 2023-08-12 NOTE — Patient Instructions (Addendum)
I really enjoyed getting to talk with you today! I am available on Tuesdays and Thursdays for virtual visits if you have any questions or concerns, or if I can be of any further assistance.   CHECKLIST FROM ANNUAL WELLNESS VISIT:  -Follow up (please call to schedule if not scheduled after visit):   -Please schedule regular follow up with your primary care doctor in the next 1-2 months as it has been some time since you have been in.   -yearly for annual wellness visit with primary care office  Here is a list of your preventive care/health maintenance measures and the plan for each if any are due:  PLAN For any measures below that may be due:   Health Maintenance  Topic Date Due   Hepatitis C Screening  Never done   DTaP/Tdap/Td (1 - Tdap) Never done   INFLUENZA VACCINE  10/25/2023 (Originally 02/25/2023)   Cervical Cancer Screening (HPV/Pap Cotest)  08/11/2024 (Originally 04/19/2023)   Pneumococcal Vaccine 46-42 Years old (1 of 2 - PCV) 08/11/2024 (Originally 06/29/1987)   COVID-19 Vaccine (5 - 2024-25 season) 08/11/2024 (Originally 03/28/2023)   Medicare Annual Wellness (AWV)  08/11/2024   HIV Screening  Completed   HPV VACCINES  Aged Out    -See a dentist at least yearly  -Get your eyes checked and then per your eye specialist's recommendations  -Other issues addressed today:   -I have included below further information regarding a healthy whole foods based diet, physical activity guidelines for adults, stress management and opportunities for social connections. I hope you find this information useful.   -----------------------------------------------------------------------------------------------------------------------------------------------------------------------------------------------------------------------------------------------------------  NUTRITION: -continue to work with your nutritionist/dietician to eat a healthy and balanced diet. Reduce and eliminate as much as  possible any foods and beverages that have added chemicals and added sugars.   EXERCISE GUIDELINES FOR ADULTS: -if you wish to increase your physical activity, do so gradually and with the approval of your doctor -STOP and seek medical care immediately if you have any chest pain, chest discomfort or trouble breathing when starting or increasing exercise  -move and stretch your body, legs, feet and arms when sitting for long periods -Physical activity guidelines for optimal health in adults: -least 150 minutes per week of aerobic exercise (can talk, but not sing) once approved by your doctor, 20-30 minutes of sustained activity or two 10 minute episodes of sustained activity every day.  -resistance training at least 2 days per week if approved by your doctor -balance exercises 3+ days per week:   Stand somewhere where you have something sturdy to hold onto if you lose balance.    1) lift up on toes, start with 5x per day and work up to 20x   2) stand and lift on leg straight out to the side so that foot is a few inches of the floor, start with 5x each side and work up to 20x each side   3) stand on one foot, start with 5 seconds each side and work up to 20 seconds on each side  If you need ideas or help with getting more active:   -Walk with a Doc: [email or web address]-[email or web address]   -YouTube has lots of exercise videos for different ages and abilities as well  ADVANCED HEALTHCARE DIRECTIVES:   Advanced Directives assistance:   [email or web address].cy  Everyone should have advanced health care directives in place. This is so that you get the care you want, should you ever be in a situation where you are unable to  make your own medical decisions.   From the [place] Advanced Directive Website: "Advance Health Care Directives are legal documents in which you give written instructions about your health care if, in the future, you cannot speak for  yourself.   A health care power of attorney allows you to name a person you trust to make your health care decisions if you cannot make them yourself. A declaration of a desire for a natural death (or living will) is document, which states that you desire not to have your life prolonged by extraordinary measures if you have a terminal or incurable illness or if you are in a vegetative state. An advance instruction for mental health treatment makes a declaration of instructions, information and preferences regarding your mental health treatment. It also states that you are aware that the advance instruction authorizes a mental health treatment provider to act according to your wishes. It may also outline your consent or refusal of mental health treatment. A declaration of an anatomical gift allows anyone over the age of 58 to make a gift by will, organ donor card or other document."   Please see the following website or an elder law attorney for forms, FAQs and for completion of advanced directives: [place] Health Care Directives Advance Health Care Directives (http://guzman.[email or web address]/)  Or copy and paste the following to your web browser: PoshChat.[email or web address]

## 2023-08-12 NOTE — Progress Notes (Signed)
 Patient unable to obtain vital signs due to telehealth visit

## 2023-08-12 NOTE — Progress Notes (Signed)
PATIENT CHECK-IN and HEALTH RISK ASSESSMENT QUESTIONNAIRE:  -completed by phone/video for upcoming Medicare Preventive Visit  -PLEASE SELECT "NOT IN PERSON" for the method of visit.   Pre-Visit Check-in: 1)Vitals (height, wt, BP, etc) - record in vitals section for visit on day of visit Request home vitals (wt, BP, etc.) and enter into vitals, THEN update Vital Signs SmartPhrase below at the top of the HPI. See below.  2)Review and Update Medications, Allergies PMH, Surgeries, Social history in Epic 3)Hospitalizations in the last year with date/reason? No   4)Review and Update Care Team (patient's specialists) in Epic 5) Complete PHQ9 in Epic  6) Complete Fall Screening in Epic 7)Review all Health Maintenance Due and order under PCP if not done.  Medicare Wellness Patient Questionnaire:  Answer theses question about your habits: How often do you have a drink containing alcohol?No How many drinks containing alcohol do you have on a typical day when you are drinking?N/A How often do you have six or more drinks on one occasion?N/A Have you ever smoked?No  Quit date if applicable? 2020  How many packs a day do/did you smoke?  2 cigars Do you use smokeless tobacco? No Do you use an illicit drugs? No On average, how many days per week do you engage in moderate to strenuous exercise (like a brisk walk)?N/A On average, how many minutes do you engage in exercise at this level?No Are you sexually active? No Number of partners? She see a dietician because of her kidney disease and bowel issues Typical breakfast: Varies  Typical lunch: Varies  Typical dinner: Varies  Typical snacks: Crackers, fruit   Beverages: filtered water, coke and juice  Answer theses question about your everyday activities: Can you perform most household chores? Yes  Are you deaf or have significant trouble hearing? No Do you feel that you have a problem with memory? Sometimes  Do you feel safe at home?Yes  Last  dentist visit? 03/23/2023 8. Do you have any difficulty performing your everyday activities? Occasionally Are you having any difficulty walking, taking medications on your own, and or difficulty managing daily home needs?no Do you have difficulty walking or climbing stairs?no Do you have difficulty dressing or bathing?No Do you have difficulty doing errands alone such as visiting a doctor's office or shopping? Yes  Do you currently have any difficulty preparing food and eating? No Do you currently have any difficulty using the toilet?No Do you have any difficulty managing your finances?No Do you have any difficulties with housekeeping of managing your housekeeping? No   Do you have Advanced Directives in place (Living Will, Healthcare Power or Attorney)? No, has the paperwork already   Last eye Exam and location? Dr. London Sheer 02/02/2023   Do you currently use prescribed or non-prescribed narcotic or opioid pain medications? no  Do you have a history or close family history of breast, ovarian, tubal or peritoneal cancer or a family member with BRCA (breast cancer susceptibility 1 and 2) gene mutations? Yes breast cancer   Nurse/Assistant Credentials/time stamp: Mg 12:34 PM    ----------------------------------------------------------------------------------------------------------------------------------------------------------------------------------------------------------------------  Because this visit was a virtual/telehealth visit, some criteria may be missing or patient reported. Any vitals not documented were not able to be obtained and vitals that have been documented are patient reported.    MEDICARE ANNUAL PREVENTIVE VISIT WITH PROVIDER: (Welcome to Medicare, initial annual wellness or annual wellness exam)  Virtual Visit via Phone Note  I connected with Cindy Luna on 08/12/23 by phone and  verified that I am speaking with the correct person using two identifiers.  Video did not work today for patient. Completed in audio only - she could see me but I could not see her.   Location patient: in [place] but visiting someone else today - so not at her house Location provider:work or home office Persons participating in the virtual visit: patient, provider  Concerns and/or follow up today: no new concerns   See HM section in Epic for other details of completed HM.    ROS: negative for report of fevers, unintentional weight loss, vision changes, vision loss, hearing loss or change, chest pain, sob, hemoptysis, melena, hematochezia, hematuria, falls, bleeding or bruising, thoughts of suicide or self harm, memory loss  Patient-completed extensive health risk assessment - reviewed and discussed with the patient: See Health Risk Assessment completed with patient prior to the visit either above or in recent phone note. This was reviewed in detailed with the patient today and appropriate recommendations, orders and referrals were placed as needed per Summary below and patient instructions.   Review of Medical History: -PMH, PSH, Family History and current specialty and care providers reviewed and updated and listed below   Patient Care Team: Deeann Saint, MD as PCP - General (Family Medicine)   Past Medical History:  Diagnosis Date   Anemia    Depression    DM (diabetes mellitus) (HCC)    DUB (dysfunctional uterine bleeding) 06/24/2016   End stage renal disease (HCC)    HLD (hyperlipidemia)    HTN (hypertension)    Lupus    Ovarian cyst 09/01/2016   Renal disorder    Sleep apnea     Past Surgical History:  Procedure Laterality Date   AV FISTULA PLACEMENT  2016 right, 2009 left   left and right are both working    CESAREAN SECTION     OTHER SURGICAL HISTORY Left    AV fistula stents, arm   TUBAL LIGATION  2010    Social History   Socioeconomic History   Marital status: Single    Spouse name: Not on file   Number of children: 1   Years of  education: Not on file   Highest education level: Bachelor's degree (e.g., BA, AB, BS)  Occupational History   Not on file  Tobacco Use   Smoking status: Former    Types: Cigars    Start date: 2018    Quit date: 07/2018    Years since quitting: 5.0   Smokeless tobacco: Never  Substance and Sexual Activity   Alcohol use: No   Drug use: No   Sexual activity: Yes    Partners: Male    Birth control/protection: None  Other Topics Concern   Not on file  Social History Narrative   ** Merged History Encounter **       Level of education: college    Employment: unemployed    Transportation: car    Exercise: no   Housing situation: apt   Relationships (safe): yes   Contact for message (voicemail): [phone]   Social Drivers of Health   Financial Resource Strain: Low Risk  (08/12/2023)   Overall Financial Resource Strain (CARDIA)    Difficulty of Paying Living Expenses: Not hard at all  Food Insecurity: No Food Insecurity (08/12/2023)   Hunger Vital Sign    Worried About Running Out of Food in the Last Year: Never true    Ran Out of Food in the Last Year: Never true  Recent Concern: Food Insecurity - Food Insecurity Present (08/12/2023)   Hunger Vital Sign    Worried About Running Out of Food in the Last Year: Sometimes true    Ran Out of Food in the Last Year: Sometimes true  Transportation Needs: No Transportation Needs (08/12/2023)   PRAPARE - Administrator, Civil Service (Medical): No    Lack of Transportation (Non-Medical): No  Physical Activity: Inactive (08/12/2023)   Exercise Vital Sign    Days of Exercise per Week: 0 days    Minutes of Exercise per Session: 0 min  Stress: No Stress Concern Present (08/12/2023)   [employer] of Occupational Health - Occupational Stress Questionnaire    Feeling of Stress : Not at all  Social Connections: Moderately Integrated (08/12/2023)   Social Connection and Isolation Panel [NHANES]    Frequency of Communication  with Friends and Family: More than three times a week    Frequency of Social Gatherings with Friends and Family: Three times a week    Attends Religious Services: More than 4 times per year    Active Member of Clubs or Organizations: Yes    Attends Banker Meetings: More than 4 times per year    Marital Status: Never married  Intimate Partner Violence: Not At Risk (08/12/2023)   Humiliation, Afraid, Rape, and Kick questionnaire    Fear of Current or Ex-Partner: No    Emotionally Abused: No    Physically Abused: No    Sexually Abused: No    Family History  Problem Relation Age of Onset   Hypertension Mother    Diabetes Mother    Autism Daughter    Thyroid disease Sister    Colon cancer Maternal Grandmother    Prostate cancer Maternal Grandfather    Breast cancer Maternal Aunt    Diabetes Other        both sides of the fam   Hypertension Other        both sides of the fam    Current Outpatient Medications on File Prior to Visit  Medication Sig Dispense Refill   cetirizine (ZYRTEC) 10 MG tablet Take 10 mg by mouth daily.     cinacalcet (SENSIPAR) 90 MG tablet Take 90 mg by mouth daily.     docusate sodium (COLACE) 100 MG capsule Take 100 mg by mouth 2 (two) times daily.     esomeprazole (NEXIUM) 40 MG capsule Take 40 mg by mouth daily.     famotidine (PEPCID) 20 MG tablet Take 1 tablet (20 mg total) by mouth daily as directed for heartburn. 30 tablet 11   linaclotide (LINZESS) 145 MCG CAPS capsule Take 145 mcg by mouth as needed.     norethindrone (AYGESTIN) 5 MG tablet Take 1 tablet by mouth daily.     sevelamer carbonate (RENVELA) 800 MG tablet Take 800 mg by mouth. Take 5 tablets three times daily with meal and 2 tablets two times with snack     SUPER B COMPLEX/C PO      tirzepatide (MOUNJARO) 10 MG/0.5ML Pen Inject 10 mg into the skin once a week. 2 mL 11   tirzepatide (MOUNJARO) 12.5 MG/0.5ML Pen Inject 12.5 mg subcutaneously once a week 2 mL 11   tirzepatide  (MOUNJARO) 15 MG/0.5ML Pen Inject 15 mg under the skin once a week 60 mL 11   tirzepatide (MOUNJARO) 5 MG/0.5ML Pen Inject 5 mg subcutaneously once a week 2 mL 11   tirzepatide (MOUNJARO) 5 MG/0.5ML Pen Inject  5 mg into the skin once a week. 2 mL 11   tirzepatide (MOUNJARO) 7.5 MG/0.5ML Pen Inject 7.5 mg into the skin once a week. 2 mL 11   triamcinolone cream (KENALOG) 0.1 % Apply 1 application topically 2 (two) times daily. 30 g 0   Zinc 50 MG TABS      Semaglutide, 1 MG/DOSE, (OZEMPIC, 1 MG/DOSE,) 4 MG/3ML SOPN Inject 1 mg into the skin once a week (Patient not taking: Reported on 04/09/2022) 3 mL 4   Semaglutide, 1 MG/DOSE, (OZEMPIC, 1 MG/DOSE,) 4 MG/3ML SOPN Inject 2 mg into to skin once a week (Patient not taking: Reported on 04/09/2022) 3 mL 4   Semaglutide, 2 MG/DOSE, (OZEMPIC, 2 MG/DOSE,) 8 MG/3ML SOPN Inject 2 mg into the skin once a week 3 mL 4   Semaglutide, 2 MG/DOSE, (OZEMPIC, 2 MG/DOSE,) 8 MG/3ML SOPN Inject 2 mg subcutaneously once a week 3 mL 11   Semaglutide-Weight Management (WEGOVY) 1.7 MG/0.75ML SOAJ Inject 1.7 mg into the skin once a week. 3 mL 3   tirzepatide (MOUNJARO) 2.5 MG/0.5ML Pen Inject 2.5 mg under the skin once a week 2 mL 0   No current facility-administered medications on file prior to visit.    Allergies  Allergen Reactions   Azithromycin Hives   Benadryl [Diphenhydramine Hcl] Hives   Doxycycline Hyclate    Vancomycin Swelling   Adhesive [Tape] Rash       Physical Exam Vitals requested from patient and listed below if patient had equipment and was able to obtain at home for this virtual visit: There were no vitals filed for this visit. Estimated body mass index is 41.81 kg/m as calculated from the following:   Height as of 08/04/22: 5\' 8"  (1.727 m).   Weight as of 08/04/22: 275 lb (124.7 kg).  EKG (optional): deferred due to virtual visit  GENERAL: alert, oriented, no acute distress detected, full vision exam deferred due to pandemic and/or virtual  encounter  PSYCH/NEURO: pleasant and cooperative, no obvious depression or anxiety, speech and thought processing grossly intact, Cognitive function grossly intact  Flowsheet Row Office Visit from 08/12/2023 in [facility]  PHQ-9 Total Score 2           08/12/2023   12:21 PM 08/04/2022   10:02 AM 04/16/2022    9:16 AM 07/31/2021   10:03 AM 07/30/2020   10:03 AM  Depression screen PHQ 2/9  Decreased Interest 0 0 0 0 0  Down, Depressed, Hopeless 0 0 0 0 0  PHQ - 2 Score 0 0 0 0 0  Altered sleeping 1  1  0  Tired, decreased energy 1  2  0  Change in appetite 0  2  0  Feeling bad or failure about yourself  0  0  0  Trouble concentrating 0  0  0  Moving slowly or fidgety/restless 0  0  0  Suicidal thoughts 0  0  0  PHQ-9 Score 2  5  0  Difficult doing work/chores Not difficult at all  Not difficult at all  Not difficult at all       04/16/2022    9:16 AM 08/02/2022    8:37 PM 08/04/2022   10:03 AM 08/12/2023   12:22 PM 08/12/2023   12:38 PM  Fall Risk  Falls in the past year? 0 0 0 0 0  Was there an injury with Fall? 0 0 0 0 0  Fall Risk Category Calculator 0  0 0 0 0   Fall Risk Category (Retired) Low Low Low    (RETIRED) Patient Fall Risk Level Low fall risk  Low fall risk    Patient at Risk for Falls Due to No Fall Risks  No Fall Risks No Fall Risks   Fall risk Follow up Falls evaluation completed  Falls prevention discussed Falls evaluation completed      Patient-reported     SUMMARY AND PLAN:  Encounter for Medicare annual wellness exam  Discussed applicable health maintenance/preventive health measures and advised and referred or ordered per patient preferences: -declines flu and pneumonia vaccines -she got the updated covid vaccine in the fall -she gets her pap smears with her gynecologist and reports had last year, Dr. Lorane Gell - asked assistant to obtain copy for record for PCP -discussed hep C sreening Health Maintenance  Topic Date  Due   Hepatitis C Screening  Never done   DTaP/Tdap/Td (1 - Tdap) Never done   INFLUENZA VACCINE  10/25/2023 (Originally 02/25/2023)   Cervical Cancer Screening (HPV/Pap Cotest)  08/11/2024 (Originally 04/19/2023)   Pneumococcal Vaccine 53-61 Years old (1 of 2 - PCV) 08/11/2024 (Originally 06/29/1987)   COVID-19 Vaccine (5 - 2024-25 season) 08/11/2024 (Originally 03/28/2023)   Medicare Annual Wellness (AWV)  08/11/2024   HIV Screening  Completed   HPV VACCINES  Aged [employer] and counseling on the following was provided based on the above review of health and a plan/checklist for the patient, along with additional information discussed, was provided for the patient in the patient instructions :  -Advised on importance of completing advanced directives, discussed options for completing and provided information in patient instructions as well -Advised and counseled on a healthy lifestyle - including the importance of a healthy diet, regular physical activity, -Reviewed patient's current diet. Advised and counseled on a whole foods based healthy diet. She sees a dietician as has kidney diease, dialysis and regurgitation issues if eats raw vegetables. -reviewed patient's current physical activity level and discussed exercise guidelines for adults. Discussed community resources and ideas for safe exercise at home to assist in meeting exercise guideline recommendations in a safe and healthy way.  -Advise yearly dental visits at minimum and regular eye exams   Follow up: see patient instructions     Patient Instructions  I really enjoyed getting to talk with you today! I am available on Tuesdays and Thursdays for virtual visits if you have any questions or concerns, or if I can be of any further assistance.   CHECKLIST FROM ANNUAL WELLNESS VISIT:  -Follow up (please call to schedule if not scheduled after visit):   -Please schedule regular follow up with your primary care doctor in the  next 1-2 months as it has been some time since you have been in.   -yearly for annual wellness visit with primary care office  Here is a list of your preventive care/health maintenance measures and the plan for each if any are due:  PLAN For any measures below that may be due:   Health Maintenance  Topic Date Due   Hepatitis C Screening  Never done   DTaP/Tdap/Td (1 - Tdap) Never done   INFLUENZA VACCINE  10/25/2023 (Originally 02/25/2023)   Cervical Cancer Screening (HPV/Pap Cotest)  08/11/2024 (Originally 04/19/2023)   Pneumococcal Vaccine 34-44 Years old (1 of 2 - PCV) 08/11/2024 (Originally 06/29/1987)   COVID-19 Vaccine (5 - 2024-25 season) 08/11/2024 (Originally 03/28/2023)   Medicare Annual Wellness (AWV)  08/11/2024   HIV Screening  Completed   HPV VACCINES  Aged Out    -See a dentist at least yearly  -Get your eyes checked and then per your eye specialist's recommendations  -Other issues addressed today:   -I have included below further information regarding a healthy whole foods based diet, physical activity guidelines for adults, stress management and opportunities for social connections. I hope you find this information useful.   -----------------------------------------------------------------------------------------------------------------------------------------------------------------------------------------------------------------------------------------------------------  NUTRITION: -continue to work with your nutritionist/dietician to eat a healthy and balanced diet. Reduce and eliminate as much as possible any foods and beverages that have added chemicals and added sugars.   EXERCISE GUIDELINES FOR ADULTS: -if you wish to increase your physical activity, do so gradually and with the approval of your doctor -STOP and seek medical care immediately if you have any chest pain, chest discomfort or trouble breathing when starting or increasing exercise  -move and stretch  your body, legs, feet and arms when sitting for long periods -Physical activity guidelines for optimal health in adults: -least 150 minutes per week of aerobic exercise (can talk, but not sing) once approved by your doctor, 20-30 minutes of sustained activity or two 10 minute episodes of sustained activity every day.  -resistance training at least 2 days per week if approved by your doctor -balance exercises 3+ days per week:   Stand somewhere where you have something sturdy to hold onto if you lose balance.    1) lift up on toes, start with 5x per day and work up to 20x   2) stand and lift on leg straight out to the side so that foot is a few inches of the floor, start with 5x each side and work up to 20x each side   3) stand on one foot, start with 5 seconds each side and work up to 20 seconds on each side  If you need ideas or help with getting more active:   -Walk with a Doc: [email or web address]   -YouTube has lots of exercise videos for different ages and abilities as well  ADVANCED HEALTHCARE DIRECTIVES:  [place] Advanced Directives assistance:   [email or web address].cy  Everyone should have advanced health care directives in place. This is so that you get the care you want, should you ever be in a situation where you are unable to make your own medical decisions.   From the [place] Advanced Directive Website: "Advance Health Care Directives are legal documents in which you give written instructions about your health care if, in the future, you cannot speak for yourself.   A health care power of attorney allows you to name a person you trust to make your health care decisions if you cannot make them yourself. A declaration of a desire for a natural death (or living will) is document, which states that you desire not to have your life prolonged by extraordinary measures if you have a terminal or incurable illness or if you are in a  vegetative state. An advance instruction for mental health treatment makes a declaration of instructions, information and preferences regarding your mental health treatment. It also states that you are aware that the advance instruction authorizes a mental health treatment provider to act according to your wishes. It may also outline your consent or refusal of mental health treatment. A declaration of an anatomical gift allows anyone over the age of 42 to make a gift by will, organ donor card or other document."   Please see the following website  or an elder Sales executive for forms, FAQs and for completion of advanced directives: [place] TEFL teacher Health Care Directives Advance Health Care Directives (http://guzman.[email or web address]/)  Or copy and paste the following to your web browser: PoshChat.[email or web address]               Terressa Koyanagi, DO

## 2023-08-23 ENCOUNTER — Other Ambulatory Visit (HOSPITAL_COMMUNITY): Payer: Self-pay

## 2023-08-31 ENCOUNTER — Other Ambulatory Visit (HOSPITAL_COMMUNITY): Payer: Self-pay

## 2023-09-07 ENCOUNTER — Other Ambulatory Visit (HOSPITAL_COMMUNITY): Payer: Self-pay

## 2023-09-09 ENCOUNTER — Other Ambulatory Visit (HOSPITAL_COMMUNITY): Payer: Self-pay

## 2023-09-13 ENCOUNTER — Other Ambulatory Visit (HOSPITAL_COMMUNITY): Payer: Self-pay

## 2023-09-22 ENCOUNTER — Other Ambulatory Visit (HOSPITAL_COMMUNITY): Payer: Self-pay

## 2023-09-22 MED ORDER — LINZESS 145 MCG PO CAPS
145.0000 ug | ORAL_CAPSULE | Freq: Every day | ORAL | 5 refills | Status: DC | PRN
  Filled 2023-09-22: qty 30, 30d supply, fill #0
  Filled 2024-01-11: qty 30, 30d supply, fill #1
  Filled 2024-05-12: qty 30, 30d supply, fill #2

## 2023-09-23 ENCOUNTER — Other Ambulatory Visit: Payer: Self-pay

## 2023-09-29 ENCOUNTER — Other Ambulatory Visit (HOSPITAL_COMMUNITY): Payer: Self-pay

## 2023-09-29 MED ORDER — MOUNJARO 15 MG/0.5ML ~~LOC~~ SOAJ
15.0000 mg | SUBCUTANEOUS | 11 refills | Status: DC
Start: 2023-09-29 — End: 2024-01-04
  Filled 2023-09-29: qty 2, 28d supply, fill #0

## 2023-09-30 ENCOUNTER — Other Ambulatory Visit: Payer: Self-pay

## 2023-10-06 ENCOUNTER — Other Ambulatory Visit (HOSPITAL_COMMUNITY): Payer: Self-pay

## 2023-10-06 MED ORDER — ZEPBOUND 2.5 MG/0.5ML ~~LOC~~ SOAJ
2.5000 mg | SUBCUTANEOUS | 11 refills | Status: DC
  Filled 2023-10-06 – 2023-11-09 (×3): qty 2, 28d supply, fill #0

## 2023-10-12 ENCOUNTER — Other Ambulatory Visit (HOSPITAL_COMMUNITY): Payer: Self-pay

## 2023-10-13 ENCOUNTER — Other Ambulatory Visit (HOSPITAL_COMMUNITY): Payer: Self-pay

## 2023-10-13 MED ORDER — WEGOVY 1 MG/0.5ML ~~LOC~~ SOAJ
1.0000 mg | SUBCUTANEOUS | 11 refills | Status: DC
  Filled 2023-10-13 – 2023-11-09 (×4): qty 2, 28d supply, fill #0

## 2023-10-14 ENCOUNTER — Other Ambulatory Visit (HOSPITAL_COMMUNITY): Payer: Self-pay

## 2023-10-15 ENCOUNTER — Other Ambulatory Visit (HOSPITAL_COMMUNITY): Payer: Self-pay

## 2023-10-21 ENCOUNTER — Ambulatory Visit (INDEPENDENT_AMBULATORY_CARE_PROVIDER_SITE_OTHER): Admitting: Adult Health

## 2023-10-21 ENCOUNTER — Encounter: Payer: Self-pay | Admitting: Adult Health

## 2023-10-21 VITALS — BP 100/60 | HR 93 | Temp 98.8°F | Ht 68.0 in | Wt 258.0 lb

## 2023-10-21 DIAGNOSIS — H6992 Unspecified Eustachian tube disorder, left ear: Secondary | ICD-10-CM | POA: Diagnosis not present

## 2023-10-21 NOTE — Progress Notes (Signed)
 Subjective:    Patient ID: Cindy Luna, female    DOB: [birth year], 43 y.o.   MRN: [id number]  HPI 43 year old female who  has a past medical history of Anemia, Depression, DM (diabetes mellitus) (HCC), DUB (dysfunctional uterine bleeding) (06/24/2016), End stage renal disease (HCC), HLD (hyperlipidemia), HTN (hypertension), Lupus, Ovarian cyst (09/01/2016), Renal disorder, and Sleep apnea.  She is a patient of Dr. Salomon Fick who I am seeing today for an acute issue. She reports that for the last 5 days she was having left sided ear itching. Last night she developed pain in the left ear and noticed the feeling of drainage in her throat.   Last night she placed a single dose of polymycin ear drops from her daughter due to the pain.   She denies fevers, chills, sinus pain or pressure. She has had some sneezing.   She uses Zyrtec and Flonase daily.   Review of Systems See HPI   Past Medical History:  Diagnosis Date   Anemia    Depression    DM (diabetes mellitus) (HCC)    DUB (dysfunctional uterine bleeding) 06/24/2016   End stage renal disease (HCC)    HLD (hyperlipidemia)    HTN (hypertension)    Lupus    Ovarian cyst 09/01/2016   Renal disorder    Sleep apnea     Social History   Socioeconomic History   Marital status: Single    Spouse name: Not on file   Number of children: 1   Years of education: Not on file   Highest education level: Bachelor's degree (e.g., BA, AB, BS)  Occupational History   Not on file  Tobacco Use   Smoking status: Former    Types: Cigars    Start date: 2018    Quit date: 07/2018    Years since quitting: 5.2   Smokeless tobacco: Never  Substance and Sexual Activity   Alcohol use: No   Drug use: No   Sexual activity: Yes    Partners: Male    Birth control/protection: None  Other Topics Concern   Not on file  Social History Narrative   ** Merged History Encounter **       Level of education: college    Employment: unemployed     Transportation: car    Exercise: no   Housing situation: apt   Relationships (safe): yes   Contact for message (voicemail): [phone]   Social Drivers of Health   Financial Resource Strain: Low Risk  (08/12/2023)   Overall Financial Resource Strain (CARDIA)    Difficulty of Paying Living Expenses: Not hard at all  Food Insecurity: No Food Insecurity (08/12/2023)   Hunger Vital Sign    Worried About Running Out of Food in the Last Year: Never true    Ran Out of Food in the Last Year: Never true  Recent Concern: Food Insecurity - Food Insecurity Present (08/12/2023)   Hunger Vital Sign    Worried About Running Out of Food in the Last Year: Sometimes true    Ran Out of Food in the Last Year: Sometimes true  Transportation Needs: No Transportation Needs (08/12/2023)   PRAPARE - Administrator, Civil Service (Medical): No    Lack of Transportation (Non-Medical): No  Physical Activity: Inactive (08/12/2023)   Exercise Vital Sign    Days of Exercise per Week: 0 days    Minutes of Exercise per Session: 0 min  Stress: No Stress Concern Present (08/12/2023)  [employer] of Occupational Health - Occupational Stress Questionnaire    Feeling of Stress : Not at all  Social Connections: Moderately Integrated (08/12/2023)   Social Connection and Isolation Panel [NHANES]    Frequency of Communication with Friends and Family: More than three times a week    Frequency of Social Gatherings with Friends and Family: Three times a week    Attends Religious Services: More than 4 times per year    Active Member of Clubs or Organizations: Yes    Attends Banker Meetings: More than 4 times per year    Marital Status: Never married  Intimate Partner Violence: Not At Risk (08/12/2023)   Humiliation, Afraid, Rape, and Kick questionnaire    Fear of Current or Ex-Partner: No    Emotionally Abused: No    Physically Abused: No    Sexually Abused: No    Past Surgical History:   Procedure Laterality Date   AV FISTULA PLACEMENT  2016 right, 2009 left   left and right are both working    CESAREAN SECTION     OTHER SURGICAL HISTORY Left    AV fistula stents, arm   TUBAL LIGATION  2010    Family History  Problem Relation Age of Onset   Hypertension Mother    Diabetes Mother    Autism Daughter    Thyroid disease Sister    Colon cancer Maternal Grandmother    Prostate cancer Maternal Grandfather    Breast cancer Maternal Aunt    Diabetes Other        both sides of the fam   Hypertension Other        both sides of the fam    Allergies  Allergen Reactions   Azithromycin Hives   Benadryl [Diphenhydramine Hcl] Hives   Doxycycline Hyclate    Vancomycin Swelling   Adhesive [Tape] Rash    Current Outpatient Medications on File Prior to Visit  Medication Sig Dispense Refill   cetirizine (ZYRTEC) 10 MG tablet Take 10 mg by mouth daily.     cinacalcet (SENSIPAR) 90 MG tablet Take 90 mg by mouth daily.     docusate sodium (COLACE) 100 MG capsule Take 100 mg by mouth 2 (two) times daily.     esomeprazole (NEXIUM) 40 MG capsule Take 40 mg by mouth daily.     famotidine (PEPCID) 20 MG tablet Take 1 tablet (20 mg total) by mouth daily as directed for heartburn. 30 tablet 11   linaclotide (LINZESS) 145 MCG CAPS capsule Take 145 mcg by mouth as needed.     linaclotide (LINZESS) 145 MCG CAPS capsule Take 1 capsule (145 mcg total) by mouth daily as needed. 30 capsule 5   norethindrone (AYGESTIN) 5 MG tablet Take 1 tablet by mouth daily.     Semaglutide-Weight Management (WEGOVY) 1 MG/0.5ML SOAJ Inject 1 mg into the skin once a week. 2 mL 11   Semaglutide-Weight Management (WEGOVY) 1.7 MG/0.75ML SOAJ Inject 1.7 mg into the skin once a week. 3 mL 3   sevelamer carbonate (RENVELA) 800 MG tablet Take 800 mg by mouth. Take 5 tablets three times daily with meal and 2 tablets two times with snack     SUPER B COMPLEX/C PO      tirzepatide (MOUNJARO) 15 MG/0.5ML Pen Inject 15  mg under the skin once a week 60 mL 11   tirzepatide (MOUNJARO) 15 MG/0.5ML Pen Inject 15 mg into the skin once a week. 2 mL 11   tirzepatide [facility])  5 MG/0.5ML Pen Inject 5 mg into the skin once a week. 2 mL 11   tirzepatide (ZEPBOUND) 2.5 MG/0.5ML Pen Inject 2.5 mg into the skin once a week. 2 mL 11   triamcinolone cream (KENALOG) 0.1 % Apply 1 application topically 2 (two) times daily. 30 g 0   Zinc 50 MG TABS      No current facility-administered medications on file prior to visit.    BP 100/60   Pulse 93   Temp 98.8 F (37.1 C) (Oral)   Ht 5\' 8"  (1.727 m)   Wt 258 lb (117 kg)   SpO2 99%   BMI 39.23 kg/m       Objective:   Physical Exam Vitals and nursing note reviewed.  Constitutional:      Appearance: Normal appearance.  HENT:     Right Ear: No middle ear effusion. Tympanic membrane is not erythematous.     Left Ear: No drainage. A middle ear effusion is present. Tympanic membrane is not erythematous or bulging.     Nose: No congestion or rhinorrhea.     Right Turbinates: Not enlarged or swollen.     Left Turbinates: Not enlarged or swollen.     Mouth/Throat:     Mouth: Mucous membranes are moist.     Pharynx: Oropharynx is clear. Uvula midline.  Cardiovascular:     Rate and Rhythm: Normal rate and regular rhythm.  Musculoskeletal:        General: Normal range of motion.  Skin:    General: Skin is warm and dry.  Neurological:     General: No focal deficit present.     Mental Status: She is alert and oriented to person, place, and time.  Psychiatric:        Mood and Affect: Mood normal.        Behavior: Behavior normal.        Thought Content: Thought content normal.        Judgment: Judgment normal.        Assessment & Plan:  1. Dysfunction of left eustachian tube (Primary) - No signs of infection  - - Will have her use Afrin for no longer than three days in a row.  - Follow up as needed or if symptoms worsen   Shirline Frees, NP

## 2023-10-28 ENCOUNTER — Other Ambulatory Visit (HOSPITAL_COMMUNITY): Payer: Self-pay

## 2023-11-08 ENCOUNTER — Other Ambulatory Visit (HOSPITAL_COMMUNITY): Payer: Self-pay

## 2023-11-08 MED ORDER — ZEPBOUND 2.5 MG/0.5ML ~~LOC~~ SOAJ
2.5000 mg | SUBCUTANEOUS | 11 refills | Status: DC
  Filled 2023-11-08: qty 2, 28d supply, fill #0

## 2023-11-09 ENCOUNTER — Other Ambulatory Visit (HOSPITAL_BASED_OUTPATIENT_CLINIC_OR_DEPARTMENT_OTHER): Payer: Self-pay

## 2023-11-09 ENCOUNTER — Other Ambulatory Visit (HOSPITAL_COMMUNITY): Payer: Self-pay

## 2023-11-09 ENCOUNTER — Other Ambulatory Visit: Payer: Self-pay

## 2023-11-12 ENCOUNTER — Other Ambulatory Visit: Payer: Self-pay

## 2023-11-22 ENCOUNTER — Encounter: Payer: Self-pay | Admitting: Nephrology

## 2023-11-22 DIAGNOSIS — E119 Type 2 diabetes mellitus without complications: Secondary | ICD-10-CM | POA: Insufficient documentation

## 2023-11-23 ENCOUNTER — Encounter: Payer: Self-pay | Admitting: Nephrology

## 2023-12-15 ENCOUNTER — Other Ambulatory Visit (HOSPITAL_COMMUNITY): Payer: Self-pay

## 2023-12-15 MED ORDER — OZEMPIC (1 MG/DOSE) 4 MG/3ML ~~LOC~~ SOPN
1.0000 mg | PEN_INJECTOR | SUBCUTANEOUS | 5 refills | Status: DC
  Filled 2023-12-15: qty 3, 28d supply, fill #0

## 2023-12-16 ENCOUNTER — Other Ambulatory Visit (HOSPITAL_COMMUNITY): Payer: Self-pay

## 2023-12-28 ENCOUNTER — Other Ambulatory Visit (HOSPITAL_COMMUNITY): Payer: Self-pay

## 2023-12-30 ENCOUNTER — Ambulatory Visit (HOSPITAL_BASED_OUTPATIENT_CLINIC_OR_DEPARTMENT_OTHER): Admitting: Orthopaedic Surgery

## 2023-12-30 ENCOUNTER — Telehealth (HOSPITAL_BASED_OUTPATIENT_CLINIC_OR_DEPARTMENT_OTHER): Payer: Self-pay | Admitting: Orthopaedic Surgery

## 2023-12-30 ENCOUNTER — Ambulatory Visit (HOSPITAL_BASED_OUTPATIENT_CLINIC_OR_DEPARTMENT_OTHER)

## 2023-12-30 ENCOUNTER — Ambulatory Visit (HOSPITAL_COMMUNITY)
Admission: RE | Admit: 2023-12-30 | Discharge: 2023-12-30 | Disposition: A | Discharge status: Home / Self Care | Attending: Vascular Surgery | Admitting: Vascular Surgery

## 2023-12-30 ENCOUNTER — Encounter (HOSPITAL_COMMUNITY)
Admission: RE | Disposition: A | Discharge status: Home / Self Care | Payer: Self-pay | Source: Home / Self Care | Attending: Vascular Surgery

## 2023-12-30 DIAGNOSIS — M7501 Adhesive capsulitis of right shoulder: Secondary | ICD-10-CM | POA: Diagnosis not present

## 2023-12-30 DIAGNOSIS — M25552 Pain in left hip: Secondary | ICD-10-CM

## 2023-12-30 DIAGNOSIS — M25551 Pain in right hip: Secondary | ICD-10-CM

## 2023-12-30 DIAGNOSIS — N186 End stage renal disease: Secondary | ICD-10-CM

## 2023-12-30 DIAGNOSIS — M7502 Adhesive capsulitis of left shoulder: Secondary | ICD-10-CM

## 2023-12-30 SURGERY — A/V SHUNT INTERVENTION
Anesthesia: LOCAL

## 2023-12-30 NOTE — Telephone Encounter (Signed)
Please advise. Do you want to change to a CT?

## 2023-12-30 NOTE — Progress Notes (Signed)
 Chief Complaint: Bilateral shoulder pain, right hip pain     History of Present Illness:     12/30/2023: Presents with bilateral shoulder pain in the setting of ongoing adhesive capsulitis and limited overhead range of motion as well as right hip pain.  She has had a history of arthritis that is known in the right hip.  She has previously trialed therapy and strengthening of the right hip without improvement.  She is very adamant to injections given her history of renal disease.   Presents today with some right shoulder pain consistent with her ongoing adhesive capsulitis.  There is some lateral shoulder pain now on the left as well.  This is worse with overhead activities.  Left shoulder does not have restricted range of motion although there is pain.  This is also worse with laying directly on it.  Cindy Luna is a 43 y.o. female with 2 months of atraumatic right shoulder pain.  She is currently on dialysis from end-stage renal disease as result of her lupus.  She states that she have any specific injury or incident to the right shoulder.  She has not noticed increasingly loss of motion and pain in the shoulder.  She is having significant difficulty feeling like she cannot sleep on the shoulder.  She states that she feels like it needs to pop.    Surgical History:   None  PMH/PSH/Family History/Social History/Meds/Allergies:    Past Medical History:  Diagnosis Date   Anemia    Depression    DUB (dysfunctional uterine bleeding) 06/24/2016   End stage renal disease (HCC)    HLD (hyperlipidemia)    HTN (hypertension)    Lupus    Ovarian cyst 09/01/2016   Renal disorder    Sleep apnea    Past Surgical History:  Procedure Laterality Date   AV FISTULA PLACEMENT  2016 right, 2009 left   left and right are both working    CESAREAN SECTION     OTHER SURGICAL HISTORY Left    AV fistula stents, arm   TUBAL LIGATION  2010   Social History    Socioeconomic History   Marital status: Single    Spouse name: Not on file   Number of children: 1   Years of education: Not on file   Highest education level: Bachelor's degree (e.g., BA, AB, BS)  Occupational History   Not on file  Tobacco Use   Smoking status: Former    Types: Cigars    Start date: 2018    Quit date: 07/2018    Years since quitting: 5.4   Smokeless tobacco: Never  Substance and Sexual Activity   Alcohol use: No   Drug use: No   Sexual activity: Yes    Partners: Male    Birth control/protection: None  Other Topics Concern   Not on file  Social History Narrative   ** Merged History Encounter **       Level of education: college    Employment: unemployed    Transportation: car    Exercise: no   Housing situation: apt   Relationships (safe): yes   Contact for message (voicemail): [phone]   Social Drivers of Health   Financial Resource Strain: Low Risk  (12/20/2023)   Received from [facility]   Overall [employer]  Strain (CARDIA)    Difficulty of Paying Living Expenses: Not hard at all  Food Insecurity: No Food Insecurity (12/20/2023)   Received from [facility]   Hunger Vital Sign    Worried About Running Out of Food in the Last Year: Never true    Ran Out of Food in the Last Year: Never true  Transportation Needs: No Transportation Needs (12/20/2023)   Received from [facility] - Transportation    Lack of Transportation (Medical): No    Lack of Transportation (Non-Medical): No  Physical Activity: Sufficiently Active (12/20/2023)   Received from [facility]   Exercise Vital Sign    Days of Exercise per Week: 3 days    Minutes of Exercise per Session: 60 min  Stress: No Stress Concern Present (12/20/2023)   Received from [facility] of Occupational Health - Occupational Stress Questionnaire    Feeling of Stress : Not at all  Social Connections: Socially Integrated (12/20/2023)   Received from  [facility]   Social Network    How would you rate your social network (family, work, friends)?: Good participation with social networks   Family History  Problem Relation Age of Onset   Hypertension Mother    Diabetes Mother    Autism Daughter    Thyroid  disease Sister    Colon cancer Maternal Grandmother    Prostate cancer Maternal Grandfather    Breast cancer Maternal Aunt    Diabetes Other        both sides of the fam   Hypertension Other        both sides of the fam   Allergies  Allergen Reactions   Azithromycin Hives   Benadryl [Diphenhydramine Hcl] Hives   Doxycycline Hyclate    Vancomycin Swelling   Adhesive [Tape] Rash   Current Outpatient Medications  Medication Sig Dispense Refill   cetirizine (ZYRTEC) 10 MG tablet Take 10 mg by mouth daily.     cinacalcet (SENSIPAR) 90 MG tablet Take 90 mg by mouth daily.     docusate sodium (COLACE) 100 MG capsule Take 100 mg by mouth 2 (two) times daily.     esomeprazole (NEXIUM) 40 MG capsule Take 40 mg by mouth daily.     famotidine  (PEPCID ) 20 MG tablet Take 1 tablet (20 mg total) by mouth daily as directed for heartburn. 30 tablet 11   linaclotide  (LINZESS ) 145 MCG CAPS capsule Take 145 mcg by mouth as needed.     linaclotide  (LINZESS ) 145 MCG CAPS capsule Take 1 capsule (145 mcg total) by mouth daily as needed. 30 capsule 5   norethindrone (AYGESTIN) 5 MG tablet Take 1 tablet by mouth daily.     Semaglutide , 1 MG/DOSE, (OZEMPIC , 1 MG/DOSE,) 4 MG/3ML SOPN Inject 1 mg subcutaneously once a week as directed for one month and go up on the dose. 3 mL 5   Semaglutide -Weight Management (WEGOVY ) 1 MG/0.5ML SOAJ Inject 1 mg into the skin once a week. 2 mL 11   Semaglutide -Weight Management (WEGOVY ) 1.7 MG/0.75ML SOAJ Inject 1.7 mg into the skin once a week. 3 mL 3   sevelamer carbonate (RENVELA) 800 MG tablet Take 800 mg by mouth. Take 5 tablets three times daily with meal and 2 tablets two times with snack     SUPER B COMPLEX/C PO       tirzepatide  (MOUNJARO ) 15 MG/0.5ML Pen Inject 15 mg under the skin once a week 60 mL 11   tirzepatide  (MOUNJARO )  15 MG/0.5ML Pen Inject 15 mg into the skin once a week. 2 mL 11   tirzepatide  (MOUNJARO ) 5 MG/0.5ML Pen Inject 5 mg into the skin once a week. 2 mL 11   tirzepatide  (ZEPBOUND ) 2.5 MG/0.5ML Pen Inject 2.5 mg into the skin once a week. 2 mL 11   tirzepatide  (ZEPBOUND ) 2.5 MG/0.5ML Pen Inject 2.5 mg into the skin once a week. 2 mL 11   triamcinolone  cream (KENALOG ) 0.1 % Apply 1 application topically 2 (two) times daily. 30 g 0   Zinc 50 MG TABS      No current facility-administered medications for this visit.   No results found.  Review of Systems:   A ROS was performed including pertinent positives and negatives as documented in the HPI.  Physical Exam :   Constitutional: NAD and appears stated age Neurological: Alert and oriented Psych: Appropriate affect and cooperative There were no vitals taken for this visit.   Comprehensive Musculoskeletal Exam:    Musculoskeletal Exam    Inspection Right Left  Skin No atrophy or winging No atrophy or winging  Palpation    Tenderness Glenohumeral joint Lateral deltoid  Range of Motion    Flexion (passive) 170 170  Flexion (active) 160 160  Extension 30 30  Abduction 170 170  ER at the side 70 70  ER at 90 of abduction    IR at 90 of abduction    Can reach behind back to L3 L3  Strength     Limited due to pain Normal  Special Tests    Pseudoparalytic No No  Neurologic    Fires PIN, radial, median, ulnar, musculocutaneous, axillary, suprascapular, long thoracic, and spinal accessory innervated muscles. No abnormal sensibility  Vascular/Lymphatic    Radial Pulse 2+ 2+  Cervical Exam    Patient has symmetric cervical range of motion with negative Spurling's test.  Special Test: Positive pain with passive external rotation     Imaging:   Xray (right shoulder 3 views normal): Normal   I personally reviewed and  interpreted the radiographs.   Assessment:   43 year old female with bilateral shoulder pain in the setting of likely adhesive capsulitis which has now been recalcitrant to overhead therapy and range of motion and strengthening.  As result I would like to obtain an MRI of both shoulders.  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  As a result I would like her to be seen with Dr. Lucienne Ryder for possible discussion of right hip as this is likely contributing to persistent knee pain.  She does have a history of femoral anteversion of the right hip and miserable malalignment which I do believe has led to earlier osteoarthritis of the right hip  Plan :    -plan for referral to Dr. Lucienne Ryder for discussion of right total hip arthroplasty    Wilhelmenia Harada, MD Attending Physician, Orthopedic Surgery  This document was dictated using Dragon voice recognition software. A reasonable attempt at proof reading has been made to minimize errors.

## 2023-12-30 NOTE — Telephone Encounter (Signed)
 Patient states she has stints in her arm and does not have documentation so they will not do her MRI and she needs to get it done some where else. Best contact number [phone]

## 2023-12-31 ENCOUNTER — Other Ambulatory Visit: Payer: Self-pay | Admitting: *Deleted

## 2023-12-31 DIAGNOSIS — N186 End stage renal disease: Secondary | ICD-10-CM

## 2023-12-31 NOTE — Telephone Encounter (Signed)
 Called and advised. She will try to call and get the information

## 2023-12-31 NOTE — Telephone Encounter (Signed)
 Can you help with this please.

## 2024-01-01 ENCOUNTER — Other Ambulatory Visit (HOSPITAL_COMMUNITY): Payer: Self-pay

## 2024-01-03 ENCOUNTER — Ambulatory Visit (HOSPITAL_COMMUNITY)

## 2024-01-04 ENCOUNTER — Ambulatory Visit: Attending: Vascular Surgery | Admitting: Vascular Surgery

## 2024-01-04 ENCOUNTER — Other Ambulatory Visit (HOSPITAL_COMMUNITY): Payer: Self-pay

## 2024-01-04 ENCOUNTER — Ambulatory Visit (HOSPITAL_COMMUNITY)
Admission: RE | Admit: 2024-01-04 | Discharge: 2024-01-04 | Disposition: A | Discharge status: Home / Self Care | Source: Ambulatory Visit | Attending: Vascular Surgery | Admitting: Vascular Surgery

## 2024-01-04 ENCOUNTER — Ambulatory Visit (HOSPITAL_COMMUNITY)
Admission: RE | Admit: 2024-01-04 | Discharge: 2024-01-04 | Disposition: A | Discharge status: Home / Self Care | Source: Ambulatory Visit | Attending: Vascular Surgery

## 2024-01-04 ENCOUNTER — Encounter (HOSPITAL_COMMUNITY): Payer: Self-pay

## 2024-01-04 ENCOUNTER — Encounter: Payer: Self-pay | Admitting: Vascular Surgery

## 2024-01-04 ENCOUNTER — Encounter (HOSPITAL_BASED_OUTPATIENT_CLINIC_OR_DEPARTMENT_OTHER): Payer: Self-pay | Admitting: Orthopaedic Surgery

## 2024-01-04 VITALS — BP 138/77 | HR 86 | Ht 68.0 in | Wt 282.0 lb

## 2024-01-04 DIAGNOSIS — T82898A Other specified complication of vascular prosthetic devices, implants and grafts, initial encounter: Secondary | ICD-10-CM

## 2024-01-04 DIAGNOSIS — N186 End stage renal disease: Secondary | ICD-10-CM

## 2024-01-04 NOTE — Progress Notes (Unsigned)
 [facility] [place]  ASSESSMENT / PLAN: 43 y.o. 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  The patient is dialyzing through a right arm fistula.  She desires excision of the left arm fistula.  The patient is a Scientist, product/process development.  She will not accept blood products.  Thankfully she is not terribly anemic.  Will coordinate excision of the left arm fistula with her nephrology team to try to drive her hemoglobin as high as we possibly can.  I will plan to see the surgery under tourniquet control is possible.  CHIEF COMPLAINT: Large left arm fistula  HISTORY OF PRESENT ILLNESS: Cindy Luna is a 43 y.o. female 43 y.o. female referred to clinic for discussion of excision of large left arm aneurysmal fistula.  This is very bothersome to the patient, and she would like this excised.  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  She reports her hemoglobin typically runs in the 10-12 range.  I counseled the patient that surgery would expose her to some risk of blood loss, and not being able to transfuse her could result in a dangerous situation and even her death.  She is understanding and still affirms her desire for both surgery and to not accept any blood products.  I think she has good understanding of what she is asking for and I think it is appropriate.  Past Medical History:  Diagnosis Date   Anemia    Depression    DUB (dysfunctional uterine bleeding) 06/24/2016   End stage renal disease (HCC)    HLD (hyperlipidemia)    HTN (hypertension)    Lupus    Ovarian cyst 09/01/2016   Renal disorder    Sleep apnea     Past Surgical History:  Procedure Laterality Date   AV FISTULA PLACEMENT  2016 right, 2009 left   left and right are both working    CESAREAN SECTION     OTHER SURGICAL HISTORY Left    AV fistula stents, arm   TUBAL LIGATION  2010    Family History  Problem Relation Age of Onset   Hypertension Mother    Diabetes Mother     Autism Daughter    Thyroid  disease Sister    Colon cancer Maternal Grandmother    Prostate cancer Maternal Grandfather    Breast cancer Maternal Aunt    Diabetes Other        both sides of the fam   Hypertension Other        both sides of the fam    Social History   Socioeconomic History   Marital status: Single    Spouse name: Not on file   Number of children: 1   Years of education: Not on file   Highest education level: Bachelor's degree (e.g., BA, AB, BS)  Occupational History   Not on file  Tobacco Use   Smoking status: Former    Types: Cigars    Start date: 2018    Quit date: 07/2018    Years since quitting: 5.4   Smokeless tobacco: Never  Substance and Sexual Activity   Alcohol use: No   Drug use: No   Sexual activity: Yes    Partners: Male    Birth control/protection: None  Other Topics Concern   Not on file  Social History Narrative   ** Merged History Encounter **       Level of education: college    Employment: unemployed  Transportation: car    Exercise: no   Housing situation: apt   Relationships (safe): yes   Contact for message (voicemail): [phone]   Social Drivers of Health   Financial Resource Strain: Low Risk  (12/20/2023)   Received from [facility]   Overall Financial Resource Strain (CARDIA)    Difficulty of Paying Living Expenses: Not hard at all  Food Insecurity: No Food Insecurity (12/20/2023)   Received from [facility]   Hunger Vital Sign    Worried About Running Out of Food in the Last Year: Never true    Ran Out of Food in the Last Year: Never true  Transportation Needs: No Transportation Needs (12/20/2023)   [facility] - [facility]    Lack of Transportation (Medical): No    Lack of Transportation (Non-Medical): No  Physical Activity: Sufficiently Active (12/20/2023)   Received from [facility]   Exercise Vital Sign    Days of Exercise per Week: 3 days    Minutes of Exercise per  Session: 60 min  Stress: No Stress Concern Present (12/20/2023)   Received from [facility] - Occupational Stress Questionnaire    Feeling of Stress : Not at all  Social Connections: Socially Integrated (12/20/2023)   Received from [facility]   Social Network    How would you rate your social network (family, work, friends)?: Good participation with social networks  Intimate Partner Violence: Not At Risk (12/20/2023)   Received from [facility]   HITS    Over the last 12 months how often did your partner physically hurt you?: Never    Over the last 12 months how often did your partner insult you or talk down to you?: Never    Over the last 12 months how often did your partner threaten you with physical harm?: Never    Over the last 12 months how often did your partner scream or curse at you?: Never    Allergies  Allergen Reactions   Azithromycin Hives   Benadryl [Diphenhydramine Hcl] Hives   Doxycycline Hyclate    Vancomycin Swelling   Adhesive [Tape] Rash    Current Outpatient Medications  Medication Sig Dispense Refill   cetirizine (ZYRTEC) 10 MG tablet Take 10 mg by mouth daily.     cinacalcet (SENSIPAR) 90 MG tablet Take 90 mg by mouth daily.     docusate sodium (COLACE) 100 MG capsule Take 100 mg by mouth 2 (two) times daily.     esomeprazole (NEXIUM) 40 MG capsule Take 40 mg by mouth daily.     famotidine  (PEPCID ) 20 MG tablet Take 1 tablet (20 mg total) by mouth daily as directed for heartburn. 30 tablet 11   linaclotide  (LINZESS ) 145 MCG CAPS capsule Take 1 capsule (145 mcg total) by mouth daily as needed. 30 capsule 5   norethindrone (AYGESTIN) 5 MG tablet Take 1 tablet by mouth daily.     sevelamer carbonate (RENVELA) 800 MG tablet Take 800 mg by mouth. Take 5 tablets three times daily with meal and 2 tablets two times with snack     SUPER B COMPLEX/C PO      triamcinolone  cream (KENALOG ) 0.1 % Apply 1 application  topically 2 (two) times daily. 30 g 0   Zinc 50 MG TABS      No current facility-administered medications for this visit.    PHYSICAL EXAM Vitals:   01/04/24 1425  BP: 138/77  Pulse:  86  SpO2: 96%  Weight: 282 lb (127.9 kg)  Height: 5' 8 (1.727 m)   Middle-age woman in no distress Regular rate and rhythm Normal breathing Large aneurysmal fistula in the left upper extremity without ulcer.    PERTINENT LABORATORY AND RADIOLOGIC DATA  Most recent CBC    Latest Ref Rng & Units 04/29/2018    7:03 PM 04/22/2018    9:51 PM 12/06/2015   12:00 AM  CBC  WBC 4.0 - 10.5 K/uL 10.4  8.9  5.7   Hemoglobin 12.0 - 15.0 g/dL 40.9  81.1  9.8   Hematocrit 36.0 - 46.0 % 36.4  36.4  31   Platelets 150 - 400 K/uL 273  263  203      Most recent CMP    Latest Ref Rng & Units 04/29/2018    7:03 PM 04/22/2018    9:51 PM 12/06/2015   12:00 AM  CMP  Glucose 70 - 99 mg/dL 914  782    BUN 6 - 20 mg/dL 14  16  13    Creatinine 0.44 - 1.00 mg/dL 9.56  2.13  08.6   Sodium 135 - 145 mmol/L 136  135  141   Potassium 3.5 - 5.1 mmol/L 3.4  3.5  4.2   Chloride 98 - 111 mmol/L 98  92    CO2 22 - 32 mmol/L 23  29    Calcium 8.9 - 10.3 mg/dL 9.3  9.6    Total Protein 6.5 - 8.1 g/dL 9.1  7.8    Total Bilirubin 0.3 - 1.2 mg/dL 0.5  0.5    Alkaline Phos 38 - 126 U/L 65  70    AST 15 - 41 U/L 10  8    ALT 0 - 44 U/L 9  9      Renal function CrCl cannot be calculated (Patient's most recent lab result is older than the maximum 21 days allowed.).  Hemoglobin A1C  Date Value  04/16/2022 5.3 %  12/06/2015 5.1   Hgb A1c MFr Bld (%)  Date Value  02/25/2021 5.6    LDL Calculated  Date Value Ref Range Status  04/18/2018 61 0 - 99 mg/dL Final    Heber Little. Edgardo Goodwill, MD FACS [facility] Phone Number: [phone] 01/05/2024 10:08 AM   Total time spent on preparing this encounter including chart review, data review, collecting history, examining the patient, and  coordinating care: 45 minutes  Portions of this report may have been transcribed using voice recognition software.  Every effort has been made to ensure accuracy; however, inadvertent computerized transcription errors may still be present.

## 2024-01-05 ENCOUNTER — Telehealth: Payer: Self-pay

## 2024-01-05 NOTE — Telephone Encounter (Signed)
 Patient will see nephrology prior to scheduling surgery with Dr. Edgardo Goodwill

## 2024-01-11 ENCOUNTER — Other Ambulatory Visit (HOSPITAL_COMMUNITY): Payer: Self-pay

## 2024-01-20 ENCOUNTER — Ambulatory Visit: Admitting: Family Medicine

## 2024-01-22 ENCOUNTER — Ambulatory Visit
Admission: RE | Admit: 2024-01-22 | Discharge: 2024-01-22 | Disposition: A | Discharge status: Home / Self Care | Source: Ambulatory Visit | Attending: Orthopaedic Surgery | Admitting: Orthopaedic Surgery

## 2024-01-22 DIAGNOSIS — M7501 Adhesive capsulitis of right shoulder: Secondary | ICD-10-CM

## 2024-01-22 DIAGNOSIS — M7502 Adhesive capsulitis of left shoulder: Secondary | ICD-10-CM

## 2024-01-23 ENCOUNTER — Emergency Department (HOSPITAL_BASED_OUTPATIENT_CLINIC_OR_DEPARTMENT_OTHER)

## 2024-01-23 ENCOUNTER — Encounter

## 2024-01-23 ENCOUNTER — Emergency Department (HOSPITAL_BASED_OUTPATIENT_CLINIC_OR_DEPARTMENT_OTHER)
Admission: EM | Admit: 2024-01-23 | Discharge: 2024-01-23 | Disposition: A | Discharge status: Home / Self Care | Attending: Emergency Medicine | Admitting: Emergency Medicine

## 2024-01-23 ENCOUNTER — Other Ambulatory Visit: Payer: Self-pay

## 2024-01-23 ENCOUNTER — Telehealth: Admitting: Family

## 2024-01-23 ENCOUNTER — Encounter (HOSPITAL_BASED_OUTPATIENT_CLINIC_OR_DEPARTMENT_OTHER): Payer: Self-pay | Admitting: Emergency Medicine

## 2024-01-23 DIAGNOSIS — E1122 Type 2 diabetes mellitus with diabetic chronic kidney disease: Secondary | ICD-10-CM | POA: Diagnosis not present

## 2024-01-23 DIAGNOSIS — N186 End stage renal disease: Secondary | ICD-10-CM

## 2024-01-23 DIAGNOSIS — R600 Localized edema: Secondary | ICD-10-CM | POA: Diagnosis not present

## 2024-01-23 DIAGNOSIS — M7989 Other specified soft tissue disorders: Secondary | ICD-10-CM | POA: Diagnosis not present

## 2024-01-23 DIAGNOSIS — Z992 Dependence on renal dialysis: Secondary | ICD-10-CM | POA: Diagnosis not present

## 2024-01-23 DIAGNOSIS — M79662 Pain in left lower leg: Secondary | ICD-10-CM | POA: Insufficient documentation

## 2024-01-23 DIAGNOSIS — M79605 Pain in left leg: Secondary | ICD-10-CM

## 2024-01-23 DIAGNOSIS — M79604 Pain in right leg: Secondary | ICD-10-CM

## 2024-01-23 LAB — CBC WITH DIFFERENTIAL/PLATELET
Abs Immature Granulocytes: 0.03 10*3/uL (ref 0.00–0.07)
Basophils Absolute: 0 10*3/uL (ref 0.0–0.1)
Basophils Relative: 0 %
Eosinophils Absolute: 0.1 10*3/uL (ref 0.0–0.5)
Eosinophils Relative: 2 %
HCT: 30.5 % — ABNORMAL LOW (ref 36.0–46.0)
Hemoglobin: 10.1 g/dL — ABNORMAL LOW (ref 12.0–15.0)
Immature Granulocytes: 0 %
Lymphocytes Relative: 14 %
Lymphs Abs: 1.1 10*3/uL (ref 0.7–4.0)
MCH: 30.4 pg (ref 26.0–34.0)
MCHC: 33.1 g/dL (ref 30.0–36.0)
MCV: 91.9 fL (ref 80.0–100.0)
Monocytes Absolute: 1.2 10*3/uL — ABNORMAL HIGH (ref 0.1–1.0)
Monocytes Relative: 15 %
Neutro Abs: 5.4 10*3/uL (ref 1.7–7.7)
Neutrophils Relative %: 69 %
Platelets: 153 10*3/uL (ref 150–400)
RBC: 3.32 MIL/uL — ABNORMAL LOW (ref 3.87–5.11)
RDW: 13.7 % (ref 11.5–15.5)
WBC: 7.9 10*3/uL (ref 4.0–10.5)
nRBC: 0 % (ref 0.0–0.2)

## 2024-01-23 LAB — BASIC METABOLIC PANEL WITH GFR
Anion gap: 17 — ABNORMAL HIGH (ref 5–15)
BUN: 57 mg/dL — ABNORMAL HIGH (ref 6–20)
CO2: 26 mmol/L (ref 22–32)
Calcium: 8.9 mg/dL (ref 8.9–10.3)
Chloride: 96 mmol/L — ABNORMAL LOW (ref 98–111)
Creatinine, Ser: 13 mg/dL — ABNORMAL HIGH (ref 0.44–1.00)
GFR, Estimated: 3 mL/min — ABNORMAL LOW (ref >60)
Glucose, Bld: 87 mg/dL (ref 70–99)
Potassium: 4.1 mmol/L (ref 3.5–5.1)
Sodium: 139 mmol/L (ref 135–145)

## 2024-01-23 LAB — HCG, SERUM, QUALITATIVE: Preg, Serum: NEGATIVE

## 2024-01-23 LAB — TROPONIN T, HIGH SENSITIVITY
Troponin T High Sensitivity: 24 ng/L — ABNORMAL HIGH (ref <19)
Troponin T High Sensitivity: 22 ng/L — ABNORMAL HIGH (ref <19)

## 2024-01-23 LAB — PRO BRAIN NATRIURETIC PEPTIDE: Pro Brain Natriuretic Peptide: 8472 pg/mL — ABNORMAL HIGH (ref <300.0)

## 2024-01-23 MED ORDER — CLINDAMYCIN HCL 300 MG PO CAPS: 300.0000 mg | ORAL_CAPSULE | Freq: Three times a day (TID) | ORAL | 0 refills | Status: AC

## 2024-01-23 MED ORDER — OXYCODONE-ACETAMINOPHEN 5-325 MG PO TABS: 1.0000 | ORAL_TABLET | Freq: Four times a day (QID) | ORAL | 0 refills | Status: DC | PRN

## 2024-01-23 MED ORDER — HYDROCODONE-ACETAMINOPHEN 5-325 MG PO TABS
1.0000 | ORAL_TABLET | Freq: Once | ORAL | Status: AC
  Administered 2024-01-23: 1 via ORAL
  Filled 2024-01-23: qty 1

## 2024-01-23 MED ORDER — IOHEXOL 350 MG/ML SOLN
80.0000 mL | Freq: Once | INTRAVENOUS | Status: AC | PRN
  Administered 2024-01-23: 80 mL via INTRAVENOUS

## 2024-01-23 NOTE — ED Provider Notes (Signed)
 [place] EMERGENCY DEPARTMENT AT [facility] Provider Note   CSN: [id number] Arrival date & time: 01/23/24  1440     Patient presents with: Leg Swelling  Cindy Luna is a 43 y.o. female ESRD on dialysis, lupus, diabetes here for evaluation of left lower extremity pain and swelling.  Started about a week ago.  Noted to have mild swelling at the area while at dialysis last week she was referred to dermatology to rule out calciphylaxis.  She states she was seen at [facility] dermatology who performed a biopsy last week and told her she did not have calciphylaxis?  She subsequently developed some redness and increased swelling and pain to the leg.  No numbness or weakness.  She states she has some chronic shortness of breath, unchanged.  No prior history of PE or DVT.  No cough.  Dialysis Monday Wednesday Friday, compliant.  Has been on dialysis for 17 years.  She was told by dermatology to take NSAIDs and told by nephrology to not take them.  No abdominal pain, no recent falls or injuries.   HPI     Prior to Admission medications   Medication Sig Start Date End Date Taking? Authorizing Provider  clindamycin  (CLEOCIN ) 300 MG capsule Take 1 capsule (300 mg total) by mouth 3 (three) times daily for 5 days. 01/23/24 01/28/24 Yes Sharnay Cashion A, PA-C  oxyCODONE -acetaminophen  (PERCOCET/ROXICET) 5-325 MG tablet Take 1 tablet by mouth every 6 (six) hours as needed for severe pain (pain score 7-10). 01/23/24  Yes Jamaury Gumz A, PA-C  cetirizine (ZYRTEC) 10 MG tablet Take 10 mg by mouth daily.    [provider]  cinacalcet (SENSIPAR) 90 MG tablet Take 90 mg by mouth daily.    [provider]  docusate sodium (COLACE) 100 MG capsule Take 100 mg by mouth 2 (two) times daily.    [provider]  esomeprazole (NEXIUM) 40 MG capsule Take 40 mg by mouth daily. 02/06/22   [provider]  famotidine  (PEPCID ) 20 MG tablet Take 1 tablet (20 mg total) by mouth  daily as directed for heartburn. 01/13/23     linaclotide  (LINZESS ) 145 MCG CAPS capsule Take 1 capsule (145 mcg total) by mouth daily as needed. 09/22/23     norethindrone (AYGESTIN) 5 MG tablet Take 1 tablet by mouth daily. 11/23/19   [provider]  sevelamer carbonate (RENVELA) 800 MG tablet Take 800 mg by mouth. Take 5 tablets three times daily with meal and 2 tablets two times with snack    [provider]  SUPER B COMPLEX/C PO  03/17/21   [provider]  triamcinolone  cream (KENALOG ) 0.1 % Apply 1 application topically 2 (two) times daily. 02/25/21   Johnny Garnette LABOR, MD  Zinc 50 MG TABS  02/04/21   [provider]    Allergies: Azithromycin, Benadryl [diphenhydramine hcl], Doxycycline hyclate, Vancomycin, and Adhesive [tape]    Review of Systems  Constitutional: Negative.   HENT: Negative.    Respiratory: Negative.    Cardiovascular: Negative.   Gastrointestinal: Negative.   Genitourinary: Negative.   Musculoskeletal:        Left leg pain and swelling  Skin:  Positive for wound.  Neurological: Negative.   All other systems reviewed and are negative.   Updated Vital Signs BP (!) 114/45   Pulse 82   Temp 97.9 F (36.6 C) (Oral)   Resp 18   SpO2 100%   Physical Exam Vitals and nursing note reviewed.  Constitutional:      General: She is not in acute distress.    Appearance: She is well-developed. She is not ill-appearing, toxic-appearing or diaphoretic.  HENT:     Head: Normocephalic and atraumatic.     Nose: Nose normal.     Mouth/Throat:     Mouth: Mucous membranes are moist.   Eyes:     Pupils: Pupils are equal, round, and reactive to light.    Cardiovascular:     Rate and Rhythm: Normal rate.     Pulses: Normal pulses.          Dorsalis pedis pulses are 2+ on the right side and 2+ on the left side.     Heart sounds: Normal heart sounds.  Pulmonary:     Effort: Pulmonary effort is normal. No respiratory distress.     Breath  sounds: Normal breath sounds.  Abdominal:     General: There is no distension.     Palpations: Abdomen is soft.     Tenderness: There is no abdominal tenderness. There is no right CVA tenderness or guarding.   Musculoskeletal:        General: Normal range of motion.     Cervical back: Normal range of motion.     Comments: Dialysis graft palpable thrill, right.  Aneurysmal left dialysis graft, previously known. diffuse tenderness left lower extremity knee distally.  She has wound to her distal medial leg, with suture in place without any drainage.  Says some mild redness and warmth to the leg.  Her compartments are soft.  Full range of motion.    Skin:    General: Skin is warm and dry.     Capillary Refill: Capillary refill takes less than 2 seconds.     Comments: No lesions, bulla, crepitus, desquamated skin   Neurological:     General: No focal deficit present.     Mental Status: She is alert.     Cranial Nerves: Cranial nerves 2-12 are intact. No cranial nerve deficit.     Sensory: Sensation is intact.     Motor: Motor function is intact. No weakness.     Coordination: Coordination is intact.     Gait: Gait is intact. Gait normal.   Psychiatric:        Mood and Affect: Mood normal.     (all labs ordered are listed, but only abnormal results are displayed) Labs Reviewed  CBC WITH DIFFERENTIAL/PLATELET - Abnormal; Notable for the following components:      Result Value   RBC 3.32 (*)    Hemoglobin 10.1 (*)    HCT 30.5 (*)    Monocytes Absolute 1.2 (*)    All other components within normal limits  BASIC METABOLIC PANEL WITH GFR - Abnormal; Notable for the following components:   Chloride 96 (*)    BUN 57 (*)    Creatinine, Ser 13.00 (*)    GFR, Estimated 3 (*)    Anion gap 17 (*)    All other components within normal limits  PRO BRAIN NATRIURETIC PEPTIDE - Abnormal; Notable for the following components:   Pro Brain Natriuretic Peptide 8,472.0 (*)    All other components  within normal limits  TROPONIN T, HIGH SENSITIVITY - Abnormal; Notable for the following components:   Troponin T High Sensitivity 24 (*)    All other components within normal limits  TROPONIN T, HIGH SENSITIVITY - Abnormal; Notable for the following components:   Troponin T High Sensitivity 22 (*)  All other components within normal limits  HCG, SERUM, QUALITATIVE    EKG: None  Radiology: CT Angio Chest PE W and/or Wo Contrast Result Date: 01/23/2024 CLINICAL DATA:  Pulmonary embolism (PE) suspected, high prob sob, left leg pain Dialysis patient, due to receive dialysis tomorrow. EXAM: CT ANGIOGRAPHY CHEST WITH CONTRAST TECHNIQUE: Multidetector CT imaging of the chest was performed using the standard protocol during bolus administration of intravenous contrast. Multiplanar CT image reconstructions and MIPs were obtained to evaluate the vascular anatomy. RADIATION DOSE REDUCTION: This exam was performed according to the departmental dose-optimization program which includes automated exposure control, adjustment of the mA and/or kV according to patient size and/or use of iterative reconstruction technique. CONTRAST:  80mL OMNIPAQUE  IOHEXOL  350 MG/ML SOLN COMPARISON:  Chest CT 06/23/2023 FINDINGS: Cardiovascular: Suboptimal contrast bolus for pulmonary artery assessment. There is no obvious central pulmonary embolus in the central pulmonary arteries to the lobar level. The segmental and subsegmental branches are not well assessed. The heart is upper normal in size. The SVC appears patent. The SVC narrowing on prior is not appreciated on the current exam. Vascular stent in the left innominate and subclavian vein. The innominate stent is patent. Cannot assess patency of the subclavian stent. Multiple chest wall collaterals are similar to prior exam. No pericardial effusion. Mediastinum/Nodes: Scattered small mediastinal lymph nodes are not enlarged by size criteria. Patulous distal esophagus.  Lungs/Pleura: No focal airspace disease. No pleural fluid. No features of pulmonary edema. Trachea and central airways are clear. The previous left lower lobe pulmonary nodule is not definitively seen. Upper Abdomen: No acute findings. Hypodense left renal lesion measures simple fluid density and is most consistent with cyst. Musculoskeletal: There are no acute or suspicious osseous abnormalities. Review of the MIP images confirms the above findings. IMPRESSION: 1. Suboptimal contrast bolus for pulmonary artery assessment. There is no obvious central pulmonary embolus in the central pulmonary arteries to the lobar level. The segmental and subsegmental branches are not well assessed. 2. No acute intrathoracic abnormality. Electronically Signed   By: Andrea Gasman M.D.   On: 01/23/2024 18:51   US  Venous Img Lower Unilateral Left Result Date: 01/23/2024 CLINICAL DATA:  swelling EXAM: LEFT LOWER EXTREMITY VENOUS DOPPLER ULTRASOUND TECHNIQUE: Gray-scale sonography with graded compression, as well as color Doppler and duplex ultrasound were performed to evaluate the lower extremity deep venous systems from the level of the common femoral vein and including the common femoral, femoral, profunda femoral, popliteal and calf veins including the posterior tibial, peroneal and gastrocnemius veins when visible. The superficial great saphenous vein was also interrogated. Spectral Doppler was utilized to evaluate flow at rest and with distal augmentation maneuvers in the common femoral, femoral and popliteal veins. COMPARISON:  None Available. FINDINGS: Contralateral Common Femoral Vein: Respiratory phasicity is normal and symmetric with the symptomatic side. No evidence of thrombus. Normal compressibility. Common Femoral Vein: No evidence of thrombus. Normal compressibility, respiratory phasicity and response to augmentation. Saphenofemoral Junction: No evidence of thrombus. Normal compressibility and flow on color Doppler  imaging. Profunda Femoral Vein: No evidence of thrombus. Normal compressibility and flow on color Doppler imaging. Femoral Vein: The distal segment was unable to be evaluated for compression as patient was unable to tolerate compression in this region. Otherwise, no evidence of thrombus in the proximal and mid segments. Normal compressibility, respiratory phasicity and response to augmentation. Popliteal Vein: No evidence of thrombus. Normal compressibility, respiratory phasicity and response to augmentation. Calf Veins: The peroneal vein was not visualized or evaluated.  Otherwise, no evidence of thrombus. Normal compressibility and flow on color Doppler imaging. Superficial Great Saphenous Vein: No evidence of thrombus. Normal compressibility. Other Findings:  None. IMPRESSION: The distal segment of the femoral vein was unable to be evaluated for compression as the patient was unable to tolerate compression in this region. Otherwise, no deep venous thrombosis within the visualized veins of the left lower extremity. Electronically Signed   By: Rogelia Myers M.D.   On: 01/23/2024 16:20   MR Shoulder Left Wo Contrast Result Date: 01/22/2024 EXAM DESCRIPTION: MR SHOULDER LEFT WO CONTRAST CLINICAL HISTORY: Shoulder pain, chronic, rotator cuff disorder suspected, xray done COMPARISON: None Available. TECHNIQUE: MRI of the shoulder is performed according to our usual protocol with multiplanar multi sequence imaging. FINDINGS: Mild insertional tendinopathy of the supraspinatus tendon without tear. The infraspinatus, subscapularis, and teres minor tendons are unremarkable. No significant joint effusion or bursal fluid. No labral tear. The biceps tendon is unremarkable. Mild acromioclavicular osteoarthritis. IMPRESSION: Mild insertional tendinopathy of the supraspinatus tendon without tear. Mild acromioclavicular osteoarthritis. Electronically signed by: Reyes Frees MD 01/22/2024 04:35 PM EDT RP Workstation:  [id number]   MR Shoulder Right Wo Contrast Result Date: 01/22/2024 EXAM DESCRIPTION: MR SHOULDER RIGHT WO CONTRAST CLINICAL HISTORY: 42 years, Female, Shoulder pain, chronic, rotator cuff disorder suspected, xray done COMPARISON: None TECHNIQUE: MRI of the shoulder was performed with multiplanar multi sequence imaging according to our usual protocol. FINDINGS: Mild to moderate insertional tendinopathy of the supraspinatus tendon and a small interstitial tear seen anteriorly. No retraction or full-thickness tear. The infraspinatus, subscapularis, and teres minor tendons are unremarkable. No significant joint effusion or bursal fluid. No labral tear. The biceps tendon is unremarkable. Mild acromioclavicular osteoarthritis. IMPRESSION: Mild to moderate insertional tendinopathy of the supraspinatus tendon and a small interstitial partial tear anteriorly. No retraction or full-thickness tear. Mild acromioclavicular osteoarthritis. Electronically signed by: Reyes Frees MD 01/22/2024 04:33 PM EDT RP Workstation: [id number]     Procedures   Medications Ordered in the ED  HYDROcodone -acetaminophen  (NORCO/VICODIN) 5-325 MG per tablet 1 tablet (1 tablet Oral Given 01/23/24 1524)  iohexol  (OMNIPAQUE ) 350 MG/ML injection 80 mL (80 mLs Intravenous Contrast Given 01/23/24 8164)    43 year old here for evaluation of left lower extremity over the last week.  ESRD patient.  Seen initially at dialysis and sent to dermatology had biopsy performed.  According to patient she was told she did not have calciphylaxis.  As having the biopsy she has increased pain, redness and warmth to the leg.  She is some chronic shortness of breath when lying flat, no DOE.SABRA  No chest pain.  She is dialysis Monday Wednesday Friday.  Goes tomorrow.  Did all her dialysis sessions last week.  No abdominal pain, back pain.  No recent falls or injuries.  No numbness or weakness to the extremities.  No history of PE or DVT.  No recent surgery,  immobilization or malignancy.  Did a telehealth visit and sent here for DVT rule out.  Patient neurovascularly intact here.  She does have some diffuse swelling, redness and warmth knee distally.  Her wound does not have any drainage.  No fluctuance or induration to suggest deep space infection.  Labs and imaging personally viewed and interpreted:  CBC without leukocytosis, hemoglobin 10.1, baseline Metabolic panel normal potassium, creatinine 13--no recent to compare Pregnancy test negative Troponin flat BNP 8472 Ultrasound negative for distal DVT unable to visualize the distal femoral due to patient compliance however nothing proximal, mid CTA Limited evaluation however  no large PE.  I discussed results with patient.  Would feel better if getting antibiotics to treat for cellulitis.  I agree.  She has multiple medication allergies.  Will start on clindamycin  given she is dialysis.  Encourage close follow-up outpatient in 24 to 48 hours for reevaluation.  She will return for any worsening symptoms.  At this time a low suspicion for acute ACS, PE, dissection, pneumothorax, pulmonary edema requiring inpatient admission, need for emergent dialysis, DVT, ischemia, necrotizing infection, fracture, dislocation, calciphylaxis, compartment syndrome, vasculitis.  Will have her follow-up outpatient, return for any worsening symptoms.  The patient has been appropriately medically screened and/or stabilized in the ED. I have low suspicion for any other emergent medical condition which would require further screening, evaluation or treatment in the ED or require inpatient management.  Patient is hemodynamically stable and in no acute distress.  Patient able to ambulate in department prior to ED.  Evaluation does not show acute pathology that would require ongoing or additional emergent interventions while in the emergency department or further inpatient treatment.  I have discussed the diagnosis with the  patient and answered all questions.  Pain is been managed while in the emergency department and patient has no further complaints prior to discharge.  Patient is comfortable with plan discussed in room and is stable for discharge at this time.  I have discussed strict return precautions for returning to the emergency department.  Patient was encouraged to follow-up with PCP/specialist refer to at discharge.                                    Medical Decision Making Amount and/or Complexity of Data Reviewed Independent Historian: friend External Data Reviewed: labs, radiology and notes. Labs: ordered. Decision-making details documented in ED Course. Radiology: ordered and independent interpretation performed. Decision-making details documented in ED Course.  Risk OTC drugs. Prescription drug management. Parenteral controlled substances. Decision regarding hospitalization. Diagnosis or treatment significantly limited by social determinants of health.       Final diagnoses:  ESRD (end stage renal disease) (HCC)  Left leg pain    ED Discharge Orders          Ordered    oxyCODONE -acetaminophen  (PERCOCET/ROXICET) 5-325 MG tablet  Every 6 hours PRN        01/23/24 1959    clindamycin  (CLEOCIN ) 300 MG capsule  3 times daily        01/23/24 1959    US  Venous Img Lower Unilateral Left        01/23/24 2003               [id number], Kaylum Shrum A, PA-C 01/23/24 2022    Patsey Lot, MD 01/23/24 2314

## 2024-01-23 NOTE — Discharge Instructions (Addendum)
 It was a pleasure taking care of you here today.  Make sure to follow-up outpatient  Return for any worsening symptoms

## 2024-01-23 NOTE — ED Notes (Signed)
 Ultrasound delayed until patient got requested pain meds

## 2024-01-23 NOTE — ED Triage Notes (Signed)
 Left leg swelling x 1 week Seen by derm with biopsy. Worse after biopsy Increased pain  Increased swelling. Seen by virtual md, sent for dval

## 2024-01-23 NOTE — ED Notes (Signed)
 Patient provided with standard walker prior to d/c.

## 2024-01-23 NOTE — Progress Notes (Signed)
 Virtual Visit Consent   [name] Cart, you are scheduled for a virtual visit with a [facility] provider today. Just as with appointments in the office, your consent must be obtained to participate. Your consent will be active for this visit and any virtual visit you may have with one of our providers in the next 365 days. If you have a MyChart account, a copy of this consent can be sent to you electronically.  As this is a virtual visit, video technology does not allow for your provider to perform a traditional examination. This may limit your provider's ability to fully assess your condition. If your provider identifies any concerns that need to be evaluated in person or the need to arrange testing (such as labs, EKG, etc.), we will make arrangements to do so. Although advances in technology are sophisticated, we cannot ensure that it will always work on either your end or our end. If the connection with a video visit is poor, the visit may have to be switched to a telephone visit. With either a video or telephone visit, we are not always able to ensure that we have a secure connection.  By engaging in this virtual visit, you consent to the provision of healthcare and authorize for your insurance to be billed (if applicable) for the services provided during this visit. Depending on your insurance coverage, you may receive a charge related to this service.  I need to obtain your verbal consent now. Are you willing to proceed with your visit today? Cindy Luna has provided verbal consent on 01/23/2024 for a virtual visit (video or telephone). Bari Learn, FNP  Date: 01/23/2024 12:59 PM   Virtual Visit via Video Note   I, Bari Learn, connected with  Cindy Luna  ([id number], January 16, 1981) on 01/23/24 at  1:00 PM EDT by a video-enabled telemedicine application and verified that I am speaking with the correct person using two identifiers.  Location: Patient: Virtual Visit Location Patient:  Home Provider: Virtual Visit Location Provider: Home Office   I discussed the limitations of evaluation and management by telemedicine and the availability of in person appointments. The patient expressed understanding and agreed to proceed.    History of Present Illness: Cindy Luna is a 43 y.o. who identifies as a female who was assigned female at birth, and is being seen today for left lower leg swelling and pain that started last week. She saw dermatologists this week for rash on the same leg and had biopsies.  She has ESRD and on dialysis on M,W,F.  She reports if she walks on the leg it can be a 10 out 10 when walking, but a 4 out 10 when having it elevated.  Reports redness, swelling, heat.   HPI: HPI  Problems:  Patient Active Problem List   Diagnosis Date Noted   DM (diabetes mellitus) (HCC)    Chronic constipation 11/16/2019   Eczema 11/16/2019   Environmental and seasonal allergies 07/17/2019   History of depression 07/17/2019   Patient is Jehovah's Witness 07/13/2019   Chest tightness 01/04/2019   Dysmenorrhea 05/03/2018   Class 3 severe obesity with serious comorbidity and body mass index (BMI) of 45.0 to 49.9 in adult 04/18/2018   Lymphadenopathy of head and neck 09/29/2017   OSA (obstructive sleep apnea) 12/10/2014   ESRD (end stage renal disease) on dialysis (HCC) 11/19/2014   Lupus (HCC) 11/19/2014    Allergies:  Allergies  Allergen Reactions   Azithromycin Hives  Benadryl [Diphenhydramine Hcl] Hives   Doxycycline Hyclate    Vancomycin Swelling   Adhesive [Tape] Rash   Medications:  Current Outpatient Medications:    cetirizine (ZYRTEC) 10 MG tablet, Take 10 mg by mouth daily., Disp: , Rfl:    cinacalcet (SENSIPAR) 90 MG tablet, Take 90 mg by mouth daily., Disp: , Rfl:    docusate sodium (COLACE) 100 MG capsule, Take 100 mg by mouth 2 (two) times daily., Disp: , Rfl:    esomeprazole (NEXIUM) 40 MG capsule, Take 40 mg by mouth daily., Disp: , Rfl:     famotidine  (PEPCID ) 20 MG tablet, Take 1 tablet (20 mg total) by mouth daily as directed for heartburn., Disp: 30 tablet, Rfl: 11   linaclotide  (LINZESS ) 145 MCG CAPS capsule, Take 1 capsule (145 mcg total) by mouth daily as needed., Disp: 30 capsule, Rfl: 5   norethindrone (AYGESTIN) 5 MG tablet, Take 1 tablet by mouth daily., Disp: , Rfl:    sevelamer carbonate (RENVELA) 800 MG tablet, Take 800 mg by mouth. Take 5 tablets three times daily with meal and 2 tablets two times with snack, Disp: , Rfl:    SUPER B COMPLEX/C PO, , Disp: , Rfl:    triamcinolone  cream (KENALOG ) 0.1 %, Apply 1 application topically 2 (two) times daily., Disp: 30 g, Rfl: 0   Zinc 50 MG TABS, , Disp: , Rfl:   Observations/Objective: Patient is well-developed, well-nourished in no acute distress.  Resting comfortably  at home.  Head is normocephalic, atraumatic.  No labored breathing.  Speech is clear and coherent with logical content.  Patient is alert and oriented at baseline.  Left foot and leg swelling and erythemas  Assessment and Plan: 1. Swelling of left foot (Primary)  2. Left leg swelling  3. ESRD (end stage renal disease) on dialysis [facility])  Advised patient to be seen in person today Discussed possibilities of DVT, cellulitis, vs gout and needed to be addressed today to rule these out.  Stop NSAIDs today Keep follow up with specialists    Follow Up Instructions: I discussed the assessment and treatment plan with the patient. The patient was provided an opportunity to ask questions and all were answered. The patient agreed with the plan and demonstrated an understanding of the instructions.  A copy of instructions were sent to the patient via MyChart unless otherwise noted below.     The patient was advised to call back or seek an in-person evaluation if the symptoms worsen or if the condition fails to improve as anticipated.    Bari Learn, FNP

## 2024-01-23 NOTE — ED Notes (Signed)
 Patient transported to CT

## 2024-01-24 ENCOUNTER — Other Ambulatory Visit (HOSPITAL_BASED_OUTPATIENT_CLINIC_OR_DEPARTMENT_OTHER): Payer: Self-pay

## 2024-01-24 ENCOUNTER — Telehealth: Payer: Self-pay

## 2024-01-24 ENCOUNTER — Ambulatory Visit: Payer: Self-pay

## 2024-01-24 ENCOUNTER — Other Ambulatory Visit (HOSPITAL_COMMUNITY): Payer: Self-pay

## 2024-01-24 MED ORDER — CEPHALEXIN 250 MG PO CAPS
250.0000 mg | ORAL_CAPSULE | Freq: Every day | ORAL | 0 refills | Status: DC
  Filled 2024-01-24: qty 7, 7d supply, fill #0

## 2024-01-24 NOTE — Telephone Encounter (Signed)
 FYI Only or Action Required?: Action required by provider: request for appointment and medication request.  Patient was last seen in primary care on 01/23/2024 by Lavell Bari LABOR, FNP. Called Nurse Triage reporting Leg Swelling. Symptoms began a week ago. Interventions attempted: Prescription medications: Clindamycin  and Rest, hydration, or home remedies. Symptoms are: gradually worsening.  Triage Disposition: See Physician Within 24 Hours-needs follow up call.   Patient/caregiver understands and will follow disposition?: No, wishes to speak with PCP  Copied from CRM [id number]. Topic: Clinical - Red Word Triage >> Jan 24, 2024 11:29 AM Cindy Luna wrote: [facility] that prompted transfer to Nurse Triage: Patient called in stated she went to the emergency yesterday for her legs swelling, patient stating they are still swelling and getting worst wants to see Dr.Banks    ----------------------------------------------------------------------- From previous Reason for Contact - Scheduling: Patient/patient representative is calling to schedule an appointment. Refer to attachments for appointment information. Reason for Disposition  [1] MODERATE leg swelling (e.g., swelling extends up to knees) AND [2] new-onset or worsening  Answer Assessment - Initial Assessment Questions 1. ONSET: When did the swelling start? (e.g., minutes, hours, days)     Started last Sunday 2. LOCATION: What part of the leg is swollen?  Are both legs swollen or just one leg?     Left lower leg and foot swelling and right leg 3. SEVERITY: How bad is the swelling? (e.g., localized; mild, moderate, severe)   - Localized: Small area of swelling localized to one leg.   - MILD pedal edema: Swelling limited to foot and ankle, pitting edema < 1/4 inch (6 mm) deep, rest and elevation eliminate most or all swelling.   - MODERATE edema: Swelling of lower leg to knee, pitting edema > 1/4 inch (6 mm) deep, rest and elevation only  partially reduce swelling.   - SEVERE edema: Swelling extends above knee, facial or hand swelling present.      Left leg is severe, right leg is mild 4. REDNESS: Does the swelling look red or infected?     redness 5. PAIN: Is the swelling painful to touch? If Yes, ask: How painful is it?   (Scale 1-10; mild, moderate or severe)     6 out of 10 6. FEVER: Do you have a fever? If Yes, ask: What is it, how was it measured, and when did it start?      no 7. CAUSE: What do you think is causing the leg swelling?     unsure 8. MEDICAL HISTORY: Do you have a history of blood clots (e.g., DVT), cancer, heart failure, kidney disease, or liver failure?     Kidney failure,  9. RECURRENT SYMPTOM: Have you had leg swelling before? If Yes, ask: When was the last time? What happened that time?     Hx of cellulitis but no swelling to this extent 10. OTHER SYMPTOMS: Do you have any other symptoms? (e.g., chest pain, difficulty breathing)       Difficulty breathing when laying flat-started 2-3 weeks ago.  11. PREGNANCY: Is there any chance you are pregnant? When was your last menstrual period?       No  Patient reports being seen in [facility] Emergency Department yesterday for foot and leg swelling. Patient reported difficulty breathing with laying down flat. ED workup completed. Patient was started on antibiotics for possible cellulitis. Patient calling with dialysis-patient reports itching that she thinks are from the antibiotic she was put on. Patient is asking for  recommendations for the itching and if she needs a different antibiotic. Per decision tree, next hospital follow up visit for PCP is on July 14. Patient is asking to be seen sooner. Needing follow up call from office.  Protocols used: Leg Swelling and Edema-A-AH

## 2024-01-24 NOTE — Transitions of Care (Post Inpatient/ED Visit) (Signed)
   01/24/2024  Name: Cindy Luna MRN: [id number] DOB: 11-May-1981  Today's TOC FU Call Status: Today's TOC FU Call Status:: Successful TOC FU Call Completed TOC FU Call Complete Date: 01/24/24 Patient's Name and Date of Birth confirmed.  Transition Care Management Follow-up Telephone Call Date of Discharge: 01/23/24  Items Reviewed:    Medications Reviewed Today: Medications Reviewed Today   Medications were not reviewed in this encounter     Home Care and Equipment/Supplies:    Functional Questionnaire:    Follow up appointments reviewed: PCP Follow-up appointment confirmed?: Yes Date of PCP follow-up appointment?: 01/25/24 Follow-up Provider: Dr. Johnny [place]: [name], CMA

## 2024-01-24 NOTE — Telephone Encounter (Signed)
 Patient is going to speak with Nephology about a medication change  per Dr. Mercer or call back for an appt

## 2024-01-25 ENCOUNTER — Ambulatory Visit: Admitting: Family Medicine

## 2024-01-27 ENCOUNTER — Ambulatory Visit: Admitting: Orthopaedic Surgery

## 2024-01-27 VITALS — Ht 68.0 in | Wt 287.0 lb

## 2024-01-27 DIAGNOSIS — M1611 Unilateral primary osteoarthritis, right hip: Secondary | ICD-10-CM

## 2024-01-27 NOTE — Progress Notes (Signed)
 The patient is a 43 year old female I am seeing for the first time.  This is mainly due to right hip pain and the potential need for a right hip replacement in the future.  She also has left hip pain.  Her right hip pain is more in the groin.  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  I believe what she is doing with more is osteoarthritis of the right hip.  It does hurt her on a daily basis.  Her other comorbidities include lupus which she states has fortunately been somewhat dormant for about 16 years now with not needing any medication for that.  However she is on dialysis Monday Wednesday and Friday schedule and is morbidly obese with a BMI of 43.64.  She occasionally will walk with assistive device but a lot of times does not need to.  There are x-rays in the canopy system for me to review of her pelvis and hips.  On examination of the right hip has some stiffness and pain with internal and external rotation.  The left hip has some pain as well but it moves more fluidly.  All the imaging studies of the hips show a well-maintained hip joint space on the left side.  The right side does show sclerotic changes consistent with avascular necrosis but there is no evidence of femoral head collapse.  There is a periarticular osteophyte off of the lateral edge of the acetabulum.  We had a long and thorough discussion about hip replacement surgery.  She knows that given her comorbidities such as lupus and being on dialysis that she has a high risk of infection.  Also with her morbid obesity, there is certainly an increased risk of surgery.  I did give her hand about hip replacement surgery and talked about what surgery involves.  I would like to see her back in 3 months with a repeat weight and BMI calculation.  Hopefully if she can take some weight off her frame we would be more  comfortable with proceeding with a hip replacement for her right hip.  She agrees with this as well.

## 2024-02-02 ENCOUNTER — Other Ambulatory Visit (HOSPITAL_COMMUNITY): Payer: Self-pay

## 2024-02-03 ENCOUNTER — Ambulatory Visit (HOSPITAL_BASED_OUTPATIENT_CLINIC_OR_DEPARTMENT_OTHER): Admitting: Orthopaedic Surgery

## 2024-02-03 ENCOUNTER — Other Ambulatory Visit (HOSPITAL_BASED_OUTPATIENT_CLINIC_OR_DEPARTMENT_OTHER): Payer: Self-pay

## 2024-02-03 ENCOUNTER — Ambulatory Visit (HOSPITAL_BASED_OUTPATIENT_CLINIC_OR_DEPARTMENT_OTHER): Payer: Self-pay | Admitting: Orthopaedic Surgery

## 2024-02-03 DIAGNOSIS — M7581 Other shoulder lesions, right shoulder: Secondary | ICD-10-CM

## 2024-02-03 DIAGNOSIS — M7501 Adhesive capsulitis of right shoulder: Secondary | ICD-10-CM | POA: Diagnosis not present

## 2024-02-03 MED ORDER — OXYCODONE HCL 5 MG PO TABS
5.0000 mg | ORAL_TABLET | ORAL | 0 refills | Status: DC | PRN
  Filled 2024-02-03: qty 10, 2d supply, fill #0

## 2024-02-03 MED ORDER — ACETAMINOPHEN 500 MG PO TABS
500.0000 mg | ORAL_TABLET | Freq: Three times a day (TID) | ORAL | 0 refills | Status: AC
  Filled 2024-02-03: qty 30, 10d supply, fill #0

## 2024-02-03 MED ORDER — ASPIRIN 325 MG PO TBEC
325.0000 mg | DELAYED_RELEASE_TABLET | Freq: Every day | ORAL | 0 refills | Status: DC
  Filled 2024-02-03: qty 14, 14d supply, fill #0

## 2024-02-03 NOTE — Progress Notes (Signed)
 Chief Complaint: Bilateral shoulder pain, right hip pain     History of Present Illness:     02/03/2024: Presents today for follow-up of bilateral shoulders.  She is here today for MRI discussion.  She is still having pain about both shoulders worse on the right than the left.  This is worse with any type of overhead activity   Presents today with some right shoulder pain consistent with her ongoing adhesive capsulitis.  There is some lateral shoulder pain now on the left as well.  This is worse with overhead activities.  Left shoulder does not have restricted range of motion although there is pain.  This is also worse with laying directly on it.  Cindy Luna is a 43 y.o. female with 2 months of atraumatic right shoulder pain.  She is currently on dialysis from end-stage renal disease as result of her lupus.  She states that she have any specific injury or incident to the right shoulder.  She has not noticed increasingly loss of motion and pain in the shoulder.  She is having significant difficulty feeling like she cannot sleep on the shoulder.  She states that she feels like it needs to pop.    Surgical History:   None  PMH/PSH/Family History/Social History/Meds/Allergies:    Past Medical History:  Diagnosis Date   Anemia    Depression    DUB (dysfunctional uterine bleeding) 06/24/2016   End stage renal disease (HCC)    HLD (hyperlipidemia)    HTN (hypertension)    Lupus    Ovarian cyst 09/01/2016   Renal disorder    Sleep apnea    Past Surgical History:  Procedure Laterality Date   AV FISTULA PLACEMENT  2016 right, 2009 left   left and right are both working    CESAREAN SECTION     OTHER SURGICAL HISTORY Left    AV fistula stents, arm   TUBAL LIGATION  2010   Social History   Socioeconomic History   Marital status: Single    Spouse name: Not on file   Number of children: 1   Years of education: Not on file   Highest  education level: Bachelor's degree (e.g., BA, AB, BS)  Occupational History   Not on file  Tobacco Use   Smoking status: Former    Types: Cigars    Start date: 2018    Quit date: 07/2018    Years since quitting: 5.5   Smokeless tobacco: Never  Substance and Sexual Activity   Alcohol use: No   Drug use: No   Sexual activity: Yes    Partners: Male    Birth control/protection: None  Other Topics Concern   Not on file  Social History Narrative   ** Merged History Encounter **       Level of education: college    Employment: unemployed    Transportation: car    Exercise: no   Housing situation: apt   Relationships (safe): yes   Contact for message (voicemail): [phone]   Social Drivers of Health   Financial Resource Strain: Low Risk  (01/18/2024)   Overall Financial Resource Strain (CARDIA)    Difficulty of Paying Living Expenses: Not very hard  Food Insecurity: No Food Insecurity (01/18/2024)   Hunger Vital Sign    Worried About Running  Out of Food in the Last Year: Never true    Ran Out of Food in the Last Year: Never true  Transportation Needs: No Transportation Needs (01/18/2024)   PRAPARE - Administrator, Civil Service (Medical): No    Lack of Transportation (Non-Medical): No  Physical Activity: Inactive (01/18/2024)   Exercise Vital Sign    Days of Exercise per Week: 0 days    Minutes of Exercise per Session: Not on file  Stress: No Stress Concern Present (01/18/2024)   [employer] of Occupational Health - Occupational Stress Questionnaire    Feeling of Stress: Only a little  Social Connections: Moderately Integrated (01/18/2024)   Social Connection and Isolation Panel    Frequency of Communication with Friends and Family: More than three times a week    Frequency of Social Gatherings with Friends and Family: More than three times a week    Attends Religious Services: More than 4 times per year    Active Member of [employer] or Organizations: Yes     Attends Engineer, structural: More than 4 times per year    Marital Status: Never married   Family History  Problem Relation Age of Onset   Hypertension Mother    Diabetes Mother    Autism Daughter    Thyroid  disease Sister    Colon cancer Maternal Grandmother    Prostate cancer Maternal Grandfather    Breast cancer Maternal Aunt    Diabetes Other        both sides of the fam   Hypertension Other        both sides of the fam   Allergies  Allergen Reactions   Azithromycin Hives   Benadryl [Diphenhydramine Hcl] Hives   Doxycycline Hyclate    Vancomycin Swelling   Adhesive [Tape] Rash   Current Outpatient Medications  Medication Sig Dispense Refill   acetaminophen  (TYLENOL ) 500 MG tablet Take 1 tablet (500 mg total) by mouth every 8 (eight) hours for 10 days. 30 tablet 0   aspirin  EC 325 MG tablet Take 1 tablet (325 mg total) by mouth daily. 14 tablet 0   oxyCODONE  (ROXICODONE ) 5 MG immediate release tablet Take 1 tablet (5 mg total) by mouth every 4 (four) hours as needed for severe pain (pain score 7-10) or breakthrough pain. 10 tablet 0   cephALEXin  (KEFLEX ) 250 MG capsule Take 1 capsule (250 mg total) by mouth daily on dialysis days after getting off dialysis. 7 capsule 0   cetirizine (ZYRTEC) 10 MG tablet Take 10 mg by mouth daily.     cinacalcet (SENSIPAR) 90 MG tablet Take 90 mg by mouth daily.     docusate sodium (COLACE) 100 MG capsule Take 100 mg by mouth 2 (two) times daily.     esomeprazole (NEXIUM) 40 MG capsule Take 40 mg by mouth daily.     famotidine  (PEPCID ) 20 MG tablet Take 1 tablet (20 mg total) by mouth daily as directed for heartburn. 30 tablet 11   linaclotide  (LINZESS ) 145 MCG CAPS capsule Take 1 capsule (145 mcg total) by mouth daily as needed. 30 capsule 5   norethindrone (AYGESTIN) 5 MG tablet Take 1 tablet by mouth daily.     oxyCODONE -acetaminophen  (PERCOCET/ROXICET) 5-325 MG tablet Take 1 tablet by mouth every 6 (six) hours as needed for  severe pain (pain score 7-10). 15 tablet 0   sevelamer carbonate (RENVELA) 800 MG tablet Take 800 mg by mouth. Take 5 tablets three times daily with meal  and 2 tablets two times with snack     SUPER B COMPLEX/C PO      triamcinolone  cream (KENALOG ) 0.1 % Apply 1 application topically 2 (two) times daily. 30 g 0   Zinc 50 MG TABS      No current facility-administered medications for this visit.   No results found.  Review of Systems:   A ROS was performed including pertinent positives and negatives as documented in the HPI.  Physical Exam :   Constitutional: NAD and appears stated age Neurological: Alert and oriented Psych: Appropriate affect and cooperative There were no vitals taken for this visit.   Comprehensive Musculoskeletal Exam:    Musculoskeletal Exam    Inspection Right Left  Skin No atrophy or winging No atrophy or winging  Palpation    Tenderness Glenohumeral joint Lateral deltoid  Range of Motion    Flexion (passive) 170 170  Flexion (active) 160 160  Extension 30 30  Abduction 170 170  ER at the side 70 70  ER at 90 of abduction    IR at 90 of abduction    Can reach behind back to L3 L3  Strength     Limited due to pain Normal  Special Tests    Pseudoparalytic No No  Neurologic    Fires PIN, radial, median, ulnar, musculocutaneous, axillary, suprascapular, long thoracic, and spinal accessory innervated muscles. No abnormal sensibility  Vascular/Lymphatic    Radial Pulse 2+ 2+  Cervical Exam    Patient has symmetric cervical range of motion with negative Spurling's test.  Special Test: Positive pain with passive external rotation     Imaging:   Xray (right shoulder 3 views normal): Normal  MRI right shoulder, MRI left shoulder: Evidence of bilateral rotator cuff impingement with bursal sided tearing as well as biceps fraying and inferior capsular thickness  I personally reviewed and interpreted the radiographs.   Assessment:   43 year old  female with bilateral shoulder pain in the setting rotator cuff impingement and mild shoulder capsulitis.  I did also describe that her biceps are significantly painful on both sides.  At this time she has trialed injections without any relief.  She has trialed therapy and strengthening without any relief.  Given this I did discuss the role for possible shoulder arthroscopy with biceps tenodesis, acromioplasty and distal clavicle resection as well as debridement of the rotator interval.  I did discuss we could also perform an injection on the left shoulder as well to hopefully get her some relief at the time of surgery.  I discussed the risks and benefits as well as limitations associated with this.  I discussed recovery timeframe after discussion she would like to proceed with the right side  Plan :    -plan for right shoulder arthroscopy with acromioplasty, distal clavicle excision, biceps tenodesis, rotator interval debridement as well as left shoulder subacromial injection    After a lengthy discussion of treatment options, including risks, benefits, alternatives, complications of surgical and nonsurgical conservative options, the patient elected surgical repair.   The patient  is aware of the material risks  and complications including, but not limited to injury to adjacent structures, neurovascular injury, infection, numbness, bleeding, implant failure, thermal burns, stiffness, persistent pain, failure to heal, disease transmission from allograft, need for further surgery, dislocation, anesthetic risks, blood clots, risks of death,and others. The probabilities of surgical success and failure discussed with patient given their particular co-morbidities.The time and nature of expected rehabilitation and recovery was discussed.The  patient's questions were all answered preoperatively.  No barriers to understanding were noted. I explained the natural history of the disease process and Rx rationale.  I  explained to the patient what I considered to be reasonable expectations given their personal situation.  The final treatment plan was arrived at through a shared patient decision making process model.     Elspeth Parker, MD Attending Physician, Orthopedic Surgery  This document was dictated using Dragon voice recognition software. A reasonable attempt at proof reading has been made to minimize errors.

## 2024-02-14 ENCOUNTER — Other Ambulatory Visit (HOSPITAL_BASED_OUTPATIENT_CLINIC_OR_DEPARTMENT_OTHER): Payer: Self-pay

## 2024-02-14 MED ORDER — LINZESS 145 MCG PO CAPS
145.0000 ug | ORAL_CAPSULE | Freq: Every day | ORAL | 2 refills | Status: AC | PRN
  Filled 2024-02-14: qty 90, 90d supply, fill #0

## 2024-02-14 MED ORDER — ESOMEPRAZOLE MAGNESIUM 40 MG PO CPDR
40.0000 mg | DELAYED_RELEASE_CAPSULE | Freq: Every day | ORAL | 3 refills | Status: DC
  Filled 2024-02-14: qty 90, 90d supply, fill #0
  Filled 2024-05-12: qty 90, 90d supply, fill #1

## 2024-02-14 MED ORDER — FAMOTIDINE 20 MG PO TABS
20.0000 mg | ORAL_TABLET | Freq: Every day | ORAL | 3 refills | Status: AC
  Filled 2024-02-14: qty 90, 90d supply, fill #0
  Filled 2024-05-12: qty 90, 90d supply, fill #1

## 2024-02-17 ENCOUNTER — Other Ambulatory Visit (HOSPITAL_BASED_OUTPATIENT_CLINIC_OR_DEPARTMENT_OTHER): Payer: Self-pay

## 2024-02-22 ENCOUNTER — Telehealth: Payer: Self-pay

## 2024-02-22 NOTE — Telephone Encounter (Signed)
 Spoke to patient re: nephrology consult and her decision on Dr. Cleda recommended L AVF Excision.  Patient stated that she had been advised that she would loose too much blood and she had decided to forgo having surgery at this time.  This nurse offered our office's support, as needed.

## 2024-02-24 ENCOUNTER — Encounter: Payer: Self-pay | Admitting: Family Medicine

## 2024-02-24 ENCOUNTER — Ambulatory Visit: Admitting: Family Medicine

## 2024-02-24 VITALS — BP 136/80 | HR 88 | Temp 98.7°F | Ht 68.0 in | Wt 288.8 lb

## 2024-02-24 DIAGNOSIS — G4733 Obstructive sleep apnea (adult) (pediatric): Secondary | ICD-10-CM

## 2024-02-24 DIAGNOSIS — M7581 Other shoulder lesions, right shoulder: Secondary | ICD-10-CM | POA: Diagnosis not present

## 2024-02-24 DIAGNOSIS — Z01818 Encounter for other preprocedural examination: Secondary | ICD-10-CM | POA: Diagnosis not present

## 2024-02-24 DIAGNOSIS — N186 End stage renal disease: Secondary | ICD-10-CM

## 2024-02-24 DIAGNOSIS — R030 Elevated blood-pressure reading, without diagnosis of hypertension: Secondary | ICD-10-CM

## 2024-02-24 DIAGNOSIS — Z992 Dependence on renal dialysis: Secondary | ICD-10-CM

## 2024-02-24 LAB — POCT GLYCOSYLATED HEMOGLOBIN (HGB A1C): Hemoglobin A1C: 5.4 % (ref 4.0–5.6)

## 2024-02-24 NOTE — Progress Notes (Signed)
 Chief Complaint  Patient presents with   Medical Management of Chronic Issues    Patient came in today for pre-op paperwork    HPI: Pt is a 43 yo female last seen by this provider in 2023, however seen by other colleagues. Pt presents for surgical risk assessment.     Patient is seen for optimization of general medical care prior to surgery. Surgery type: Right shoulder arthroscopy with distal clavicle resection, acromioplasty, and biceps tenodesis Date of surgery: TBD  Kidney disease? Yes, ESRD on HD Prior surgeries/Issues following anesthesia? A-V Fistula placement, tubal ligation, c-section. Hx MI, heart arrythmia, CHF, angina or stroke? No.  OSA Epilepsy or Seizures? None  Arthritis or problems with neck or jaw? None   Thyroid  disease? None  Liver disease? None  Asthma, COPD or chronic lung disease? None  Diabetes? Not currently.  H/o DM after prednisone use. Not on meds. (Needs to be evaluated by anesthesia if yes to these questions.)  Other: Poor nutrition, Frail or other: no  METS:  ?Can take care of self, such as eat, dress, or use the toilet (1 MET). yes ?Can walk up a flight of steps or a hill (4 METs).yes ?Can do heavy work around the house such as scrubbing floors or lifting or moving heavy furniture (between 4 and 10 METs). yes ?Can participate in strenuous sports such as swimming, singles tennis, football, basketball, and skiing (>10 METs) . AHA Risks: Major predictors that require intensive management and may lead to delay in or cancellation of the operative procedure unless emergent: NONE   Unstable coronary syndromes including unstable or severe angina or recent MI   Decompensated heart failure including NYHA functional class IV or worsening or new-onset HF   Significant arrhythmias including high grade AV block, symptomatic ventricular arrhythmias, supraventricular arrhythmias with ventricular rate >100 bpm at rest, symptomatic bradycardia, and newly recognized  ventricular tachycardia   Severe heart valve disease including severe aortic stenosis or symptomatic mitral stenosis   Other clinical predictors that warrant careful assessment of current status: NONE   History of ischemic heart disease  History of cerebrovascular disease   History of compensated heart failure or prior heart failure   Diabetes mellitus   Renal insufficiency  Type of surgery and Risk: 1) High risk (reported risk of cardiac death or nonfatal myocardial infarction [MI] often greater than 5 percent):   Aortic and other major vascular surgery   Peripheral artery surgery   2)Intermediate risk (reported risk of cardiac death or nonfatal MI generally 1 to 5 percent):   Carotid endarterectomy   Head and neck surgery   Intraperitoneal and intrathoracic surgery   Orthopedic surgery   Prostate surgery   3)Low risk (reported risk of cardiac death or nonfatal MI generally less than 1 percent):   Ambulatory surgery   Endoscopic procedures   Superficial procedure   Cataract surgery   Breast surgery  Medications that need to be addressed prior to surgery: None Discontinue acei/arbs/non-statin lipid lowering drugs day of surgery ASA stop 7 days before or discuss with cardiology if CV risks, other anticoagulants discuss with cardiology.  ROS: See pertinent positives and negatives per HPI. 11 point ROS negative except where noted.  Past Medical History:  Diagnosis Date   Allergy    Anemia    Anxiety    Depression    DUB (dysfunctional uterine bleeding) 06/24/2016   End stage renal disease (HCC)    GERD (gastroesophageal reflux disease)    HLD (hyperlipidemia)  HTN (hypertension)    Lupus    Ovarian cyst 09/01/2016   Renal disorder    Sleep apnea     Past Surgical History:  Procedure Laterality Date   AV FISTULA PLACEMENT  2016 right, 2009 left   left and right are both working    CESAREAN SECTION     OTHER SURGICAL HISTORY Left    AV fistula stents, arm    TUBAL LIGATION  2010    Family History  Problem Relation Age of Onset   Drug abuse Father    Early death Father    Hypertension Mother    Diabetes Mother    Autism Daughter    ADD / ADHD Daughter    Learning disabilities Daughter    Thyroid  disease Sister    Colon cancer Maternal Grandmother    Cancer Maternal Grandmother    Prostate cancer Maternal Grandfather    Cancer Maternal Grandfather    Stroke Maternal Grandfather    Breast cancer Maternal Aunt    Hypertension Maternal Aunt    Diabetes Other        both sides of the fam   Hypertension Other        both sides of the fam   Alcohol abuse Maternal Uncle    Cancer Maternal Aunt    Depression Maternal Aunt    Diabetes Maternal Aunt    Diabetes Paternal Uncle    Hypertension Paternal Uncle     Social History   Socioeconomic History   Marital status: Single    Spouse name: Not on file   Number of children: 1   Years of education: Not on file   Highest education level: Bachelor's degree (e.g., BA, AB, BS)  Occupational History   Not on file  Tobacco Use   Smoking status: Former    Types: Cigars    Start date: 2018    Quit date: 07/2018    Years since quitting: 5.5   Smokeless tobacco: Never  Substance and Sexual Activity   Alcohol use: No   Drug use: No   Sexual activity: Yes    Partners: Male    Birth control/protection: None  Other Topics Concern   Not on file  Social History Narrative   ** Merged History Encounter **       Level of education: college    Employment: unemployed    Transportation: car    Exercise: no   Housing situation: apt   Relationships (safe): yes   Contact for message (voicemail): [phone]   Social Drivers of Health   Financial Resource Strain: Low Risk  (01/18/2024)   Overall Financial Resource Strain (CARDIA)    Difficulty of Paying Living Expenses: Not very hard  Food Insecurity: No Food Insecurity (01/18/2024)   Hunger Vital Sign    Worried About Running Out of  Food in the Last Year: Never true    Ran Out of Food in the Last Year: Never true  Transportation Needs: No Transportation Needs (01/18/2024)   PRAPARE - Administrator, Civil Service (Medical): No    Lack of Transportation (Non-Medical): No  Physical Activity: Inactive (01/18/2024)   Exercise Vital Sign    Days of Exercise per Week: 0 days    Minutes of Exercise per Session: Not on file  Stress: No Stress Concern Present (01/18/2024)   [employer] of Occupational Health - Occupational Stress Questionnaire    Feeling of Stress: Only a little  Social Connections: Moderately Integrated (01/18/2024)  Social Connection and Isolation Panel    Frequency of Communication with Friends and Family: More than three times a week    Frequency of Social Gatherings with Friends and Family: More than three times a week    Attends Religious Services: More than 4 times per year    Active Member of [employer] or Organizations: Yes    Attends Engineer, structural: More than 4 times per year    Marital Status: Never married     Current Outpatient Medications:    aspirin  EC 325 MG tablet, Take 1 tablet (325 mg total) by mouth daily., Disp: 14 tablet, Rfl: 0   cetirizine (ZYRTEC) 10 MG tablet, Take 10 mg by mouth daily., Disp: , Rfl:    cinacalcet (SENSIPAR) 90 MG tablet, Take 90 mg by mouth daily., Disp: , Rfl:    docusate sodium (COLACE) 100 MG capsule, Take 100 mg by mouth 2 (two) times daily., Disp: , Rfl:    esomeprazole  (NEXIUM ) 40 MG capsule, Take 40 mg by mouth daily., Disp: , Rfl:    esomeprazole  (NEXIUM ) 40 MG capsule, Take 1 capsule (40 mg total) by mouth daily., Disp: 90 capsule, Rfl: 3   famotidine  (PEPCID ) 20 MG tablet, Take 1 tablet (20 mg total) by mouth daily as directed for heartburn., Disp: 30 tablet, Rfl: 11   famotidine  (PEPCID ) 20 MG tablet, Take 1 tablet (20 mg total) by mouth daily as directed for heartburn., Disp: 90 tablet, Rfl: 3   linaclotide  (LINZESS ) 145 MCG  CAPS capsule, Take 1 capsule (145 mcg total) by mouth daily as needed., Disp: 30 capsule, Rfl: 5   linaclotide  (LINZESS ) 145 MCG CAPS capsule, Take 1 capsule (145 mcg total) by mouth daily as needed., Disp: 90 capsule, Rfl: 2   norethindrone (AYGESTIN) 5 MG tablet, Take 1 tablet by mouth daily., Disp: , Rfl:    oxyCODONE  (ROXICODONE ) 5 MG immediate release tablet, Take 1 tablet (5 mg total) by mouth every 4 (four) hours as needed for severe pain (pain score 7-10) or breakthrough pain., Disp: 10 tablet, Rfl: 0   oxyCODONE -acetaminophen  (PERCOCET/ROXICET) 5-325 MG tablet, Take 1 tablet by mouth every 6 (six) hours as needed for severe pain (pain score 7-10)., Disp: 15 tablet, Rfl: 0   sevelamer carbonate (RENVELA) 800 MG tablet, Take 800 mg by mouth. Take 5 tablets three times daily with meal and 2 tablets two times with snack, Disp: , Rfl:    SUPER B COMPLEX/C PO, , Disp: , Rfl:    triamcinolone  cream (KENALOG ) 0.1 %, Apply 1 application topically 2 (two) times daily., Disp: 30 g, Rfl: 0   Zinc 50 MG TABS, , Disp: , Rfl:    cephALEXin  (KEFLEX ) 250 MG capsule, Take 1 capsule (250 mg total) by mouth daily on dialysis days after getting off dialysis., Disp: 7 capsule, Rfl: 0  EXAM:  Vitals:   02/24/24 1338 02/24/24 1411  BP: (!) 160/82 136/80  Pulse: 88   Temp: 98.7 F (37.1 C)   SpO2: 99%     Body mass index is 43.91 kg/m.  Physical Exam Constitutional:      General: She is not in acute distress.    Appearance: Normal appearance.  HENT:     Head: Normocephalic and atraumatic.     Nose: Nose normal.     Mouth/Throat:     Mouth: Mucous membranes are moist.  Cardiovascular:     Rate and Rhythm: Normal rate and regular rhythm.     Heart sounds: Normal  heart sounds. No murmur heard.    No gallop.  Pulmonary:     Effort: Pulmonary effort is normal. No respiratory distress.     Breath sounds: Normal breath sounds. No wheezing, rhonchi or rales.  Musculoskeletal:     Right shoulder:  Tenderness present. Decreased range of motion.     Left shoulder: Tenderness and bony tenderness present. Decreased range of motion.  Skin:    General: Skin is warm and dry.  Neurological:     Mental Status: She is alert and oriented to person, place, and time.     ASSESSMENT AND PLAN:  Discussed the following assessment and plan:  Pre-op evaluation - Plan: POC HgB A1c, Ambulatory referral to Cardiology  Tendinitis of right rotator cuff  OSA on CPAP - Plan: Ambulatory referral to Pulmonology  ESRD (end stage renal disease) on dialysis [facility]) - Plan: Ambulatory referral to Cardiology  Morbid obesity (HCC)  Elevated blood pressure reading  Assessment: -Risk factors: ESRD on HD -Surgery Risks:intermediate -age, nutritional status, fraility: good nutritional status, age >85, no fraility -functional capacity: > 4 METs wethout symptoms -comorbidities: none Patient Specific Risks: patient is low-moderate risk for intermediate risks surgery   Recommendations for optimizing general medical care prior to surgery: -advised patient to discuss specific risks morbidity and mortality of surgery with surgeon, CV risks discussed with patient -advised patient will defer to surgeon for post-op DVT prophylaxis and post op care -no specific medical recommendations for this patient at this time and no recommendations to defer surgery or for further CV testing prior to surgery -form for pre-op optimization of general medical care prior to surgery faxed to surgeon office  -Patient advised to return or notify a doctor immediately if symptoms worsen or persist or new concerns arise.  Patient Instructions  Annual referral was placed for you to see the pulmonologist for your CPAP.  Another was placed to have evaluation by cardiology for surgery.  They will contact you in regards to setting up these appointments.   Clotilda JONELLE Mercer Clotilda JONELLE Mercer, MD

## 2024-02-24 NOTE — Patient Instructions (Addendum)
 A referral was placed for you to see the pulmonologist for your CPAP.  Another was placed to have evaluation by cardiology for surgery.  They will contact you in regards to setting up these appointments.

## 2024-03-07 ENCOUNTER — Other Ambulatory Visit (HOSPITAL_BASED_OUTPATIENT_CLINIC_OR_DEPARTMENT_OTHER): Payer: Self-pay

## 2024-03-17 ENCOUNTER — Other Ambulatory Visit (HOSPITAL_BASED_OUTPATIENT_CLINIC_OR_DEPARTMENT_OTHER): Payer: Self-pay | Admitting: Orthopaedic Surgery

## 2024-03-17 DIAGNOSIS — M7581 Other shoulder lesions, right shoulder: Secondary | ICD-10-CM

## 2024-04-14 ENCOUNTER — Other Ambulatory Visit (HOSPITAL_BASED_OUTPATIENT_CLINIC_OR_DEPARTMENT_OTHER): Payer: Self-pay

## 2024-04-14 ENCOUNTER — Other Ambulatory Visit (HOSPITAL_BASED_OUTPATIENT_CLINIC_OR_DEPARTMENT_OTHER): Payer: Self-pay | Admitting: Orthopaedic Surgery

## 2024-04-19 ENCOUNTER — Other Ambulatory Visit (HOSPITAL_BASED_OUTPATIENT_CLINIC_OR_DEPARTMENT_OTHER): Payer: Self-pay

## 2024-04-20 ENCOUNTER — Other Ambulatory Visit (HOSPITAL_BASED_OUTPATIENT_CLINIC_OR_DEPARTMENT_OTHER): Payer: Self-pay

## 2024-04-20 ENCOUNTER — Telehealth (HOSPITAL_BASED_OUTPATIENT_CLINIC_OR_DEPARTMENT_OTHER): Payer: Self-pay | Admitting: Orthopaedic Surgery

## 2024-04-20 NOTE — Telephone Encounter (Signed)
 Patient states that she took her meds that Dr B gave her for surgery and she does not have anymore for her surgery that is on Tuesday. Please advise best contact [phone]

## 2024-04-20 NOTE — Progress Notes (Signed)
 Cindy Luna                                          MRN: [id number]   04/20/2024   The VBCI Quality Team Specialist reviewed this patient medical record for the purposes of chart review for care gap closure. The following were reviewed: abstraction for care gap closure-glycemic status assessment.    VBCI Quality Team

## 2024-04-20 NOTE — Progress Notes (Signed)
 [facility] Pulmonary, Critical Care and Sleep Medicine   Name: Cindy Luna MRN: [id number] DOB: 1981-01-27 Referring provider: Bernardino Gasman, MD  Chief Complaint  Patient presents with  . Sleep Apnea    No issues with cpap machine. Doesn't need any supplies at this time    Summary  43 y.o. female with obstructive sleep apnea.  Subjective   She had a home sleep study done in 2020 and found to have severe sleep apnea.  She was started on CPAP then and this has helped.  She uses a full face mask.  Pressure setting is comfortable.  She was seen previously by Dr. Donzetta.  She doesn't have trouble falling asleep or staying asleep.  She doesn't use anything to help sleep.  She gets about 10 hours of sleep on her dialysis days since she naps after dialysis.  Otherwise she gets about 8 hours of sleep.  She doesn't use anything to help her stay awake.  She has an appointment at [facility] to assess whether she is a renal transplant candidate.  She was assessed at [facility].  She was using ozempic  and mounjaro , and these helped her lose weight.  Unfortunately, her insurance provider decided to stop coverage of these medications and she no longer could afford therapy.  Her weight went back up and her sleep got worse with increased weight.    She denies history of sleep walking, sleep talking, bruxism, or nightmares.  No history of restless leg syndrome.  She denies sleep hallucinations, sleep paralysis, or cataplexy.   Past Medical History  Lupus nephritis with ESRD, GERD, COVID, Allergies, Hepatic steatosis, Hypertension, IDA, PAD, Secondary hyperparathyroidism  Past Surgical History  She  has a past surgical history that includes Renal biopsy; Tubal ligation (2011); Cesarean section, unspecified (2007); Other surgical history (2009); Leg Surgery; AV fistula placement (Right, 12/11/2014); and Endometrial ablation (2011).  Social History  She  reports that she has quit  smoking. She has never used smokeless tobacco. She reports that she does not drink alcohol and does not use drugs.  Family History  Her family history includes Diabetes in her mother; Hyperlipidemia in her mother; Hypertension in her mother; Thyroid  disease in her sister.    Vital Signs  BP 132/83   Pulse 95   Temp 98.1 F (36.7 C) (Oral)   Resp 17   Ht 1.727 m (5' 8)   Wt 134 kg (294 lb 6.4 oz)   SpO2 98%   BMI 44.76 kg/m   Physical Exam   General - pleasant ENT - no sinus tenderness, no oral exudate, MP 3, no LAN Cardiac - regular rate and rhythm, no murmurs Chest - breath sounds equal bilaterally, no wheezing or rales Ext - AV fistula in Rt and Lt arm (Rt arm is the she uses for HD), no edema, cyanosis, or clubbing Skin - no rashes Psych - normal mood and behavior   Pulmonary Tests    Sleep Tests  HST 01/05/19 >> AHI 54.3, SpO2 low 57% Auto CPAP 03/20/24 to 04/18/24 >> used on 30 of 30 nights with average 7 hrs 52 min.  Average AHI 2.6 with median CPAP 12 and 95 th percentile CPAP 15 cm H2O    Chest Imaging    Cardiac Tests       Assessment/Plan   Severe obstructive sleep apnea with morbid obesity and BMI > 30. - reviewed her sleep study results - discussed how sleep apnea can impact her health -  treatment options reviewed - she is compliant with CPAP and reports benefit from therapy - she uses [employer] for her DME - will arrange for new auto CPAP 5 to 18 cm H2O - she is a candidate for zepbound ; discussed dosing schedule and side effect profile  Patient Instructions   Patient Instructions  Will see if you can get approval to start zepbound .  Will arrange for a new auto CPAP machine.  Follow up in 2 months.  Allergies and Medications  Allergies[1]    Medication List       * Accurate as of April 20, 2024 11:37 AM. If you have any questions, ask your nurse or doctor.          START taking these medications    Zepbound  2.5 mg/0.5 mL  subcutaneous pen injector Generic drug: tirzepatide  (weight loss) Inject 0.5 mL (2.5 mg total) under the skin every 7 days Indications: sleep apnea due to airway obstruction. Replaces: Mounjaro  15 mg/0.5 mL subcutaneous pen injector Started by: Vineet Sood, MD       CONTINUE taking these medications    acetaminophen  650 mg ER tablet Commonly known as: TYLENOL  Take 1,300 mg by mouth every 8 (eight) hours.   B-complex with vitamin C tablet Take 1 tablet by mouth daily.   cetirizine 10 mg tablet Commonly known as: ZyrTEC Take 10 mg by mouth daily.   cinacalcet 90 mg tablet Commonly known as: SENSIPAR Take 120 mg by mouth daily.   docusate sodium 100 mg capsule Commonly known as: COLACE Take 300 mg by mouth daily.   esomeprazole  40 mg DR capsule Commonly known as: NexIUM  Take 40 mg by mouth every evening.   famotidine  20 mg tablet Commonly known as: PEPCID  Take 20 mg by mouth every evening.   Linzess  145 mcg Cap capsule Generic drug: linaCLOtide  Take 145 mcg by mouth daily as needed.   Mircera 50 mcg/0.3 mL Syrg Generic drug: epoetin beta, methoxy peg as needed.   norethindrone 5 mg Tab Commonly known as: AYGESTIN Take 5 mg by mouth daily.   sevelamer carbonate 800 mg tablet Commonly known as: RENVELA Take 2,400 mg by mouth in the morning and 2,400 mg at noon and 2,400 mg in the evening. Take with meals.   triamcinolone  acetonide 0.1 % cream Commonly known as: KENALOG  Apply topically 2 (two) times a day.       STOP taking these medications    doxercalciferoL 4 mcg/2 mL Soln Commonly known as: HECTOROL Stopped by: Vineet Sood, MD   Mounjaro  15 mg/0.5 mL subcutaneous pen injector Generic drug: tirzepatide  Replaced by: Zepbound  2.5 mg/0.5 mL subcutaneous pen injector Stopped by: Vineet Sood, MD         Where to Get Your Medications     These medications were sent to [id number] - [place], [place] - [address] AT [facility]  [address] - PHONE: [phone] - FAX: [phone]  [address] [place]  [address]    Phone: [phone]  Zepbound  2.5 mg/0.5 mL subcutaneous pen injector     Time Spent  On the day of the visit I spent 48 minutes preparing to see the patient, obtaining and/or reviewing separately obtained history, performing a medically appropriate examination and evaluation, counseling and educating the patient/caregiver, ordering medications, test, or procedures, and documenting clinical information in the electronic medical record.This time does not include any time spent performing procedures or assesments that are separately billable.    Signature  Carolynne Allan, MD 04/20/2024,  11:37 AM  [address]   [address]   [address] [place], [place] [address] [place], [place] [address]  [place], [place] [address] [phone]  [phone]  [phone] - 2090       [1] Allergies Allergen Reactions  . Azithromycin Hives  . Vancomycin Anaphylaxis  . Alum-Mag Hydroxide-Simeth Hives and Other (See Comments)  . Clindamycin  Itching  . Diphenhydramine Hcl Itching    Hives  . Adhesive Rash    Paper tape ok.  . Doxycycline Rash  *Some images could not be shown.

## 2024-04-21 ENCOUNTER — Encounter (HOSPITAL_COMMUNITY): Payer: Self-pay | Admitting: Orthopaedic Surgery

## 2024-04-21 ENCOUNTER — Other Ambulatory Visit: Payer: Self-pay

## 2024-04-21 NOTE — Progress Notes (Signed)
 PCP - none Cardiologist - Dr Haywood Russel (saw once)  CT Chest x-ray - 01/23/24 EKG - n/a Stress Test - 05/21/20 ECHO - 05/21/15 Cardiac Cath - n/a  ICD Pacemaker/Loop - n/a  Sleep Study -  Yes (2020) CPAP - uses CPAP  Diabetes - n/a  Blood Thinner Instructions:  n/a  Aspirin  Instructions: to be used after surgery  ERAS - clear liquids til 10:15 AM DOS.  Anesthesia review: Yes  STOP now taking any Aspirin  (unless otherwise instructed by your surgeon), Aleve, Naproxen, Ibuprofen, Motrin, Advil, Goody's, BC's, all herbal medications, fish oil, and all vitamins.   Coronavirus Screening Do you have any of the following symptoms:  Cough yes/no: No Fever (>100.2F)  yes/no: No Runny nose yes/no: No Sore throat yes/no: No Difficulty breathing/shortness of breath  yes/no: No  Have you traveled in the last 14 days and where? yes/no: No  Patient verbalized understanding of instructions that were given via phone.

## 2024-04-21 NOTE — Telephone Encounter (Signed)
 Post op meds

## 2024-04-24 ENCOUNTER — Other Ambulatory Visit (HOSPITAL_BASED_OUTPATIENT_CLINIC_OR_DEPARTMENT_OTHER): Payer: Self-pay

## 2024-04-24 NOTE — Progress Notes (Signed)
 Anesthesia Chart Review:  Case: [id number] Date/Time: 04/25/24 1300   Procedures:      ARTHROSCOPY, SHOULDER WITH DEBRIDEMENT (Right) - RIGHT SHOULDER ARTHROSCOPY WITH DISTAL CLAVICLE RESECTION, ACROMIOPLASTY, BICEPS TENODESIS     INJECTION, SHOULDER (Left: Shoulder) - LEFT GLENOHUMERAL INJECTION   Anesthesia type: General   Diagnosis:      Rotator cuff impingement syndrome, right [M75.41]     Adhesive capsulitis of left shoulder [M75.02]   Pre-op diagnosis: RIGHT SHOULDER ROTATOR CUFF IMPINGEMENT, LEFT SHOULDER ADHESIVE CAPSULITIS   Location: [facility] ROOM [facility] / [facility]   Surgeons: Genelle Standing, MD       DISCUSSION: Patient is a 43 year old female scheduled for the above procedure.   History includes former smoker, ESRD (HD MWF, RUE AVF), lupus(SLE), HTN, HLD, OSA (uses CPAP), anemia, anxiety, GERD, PVD, fatty liver, osteoarthritis, obesity.  Preoperative cardiology evaluation by Dr. Inez ([facility]) on 03/07/2024 who wrote: Preoperative cardiovascular risk assessment Recent ED visit with severely elevated pro t BNP. In the absence of symptoms this is of unclear significance.  Regarding cardiovascular risk assessment patient is at elevated but acceptable risk of preoperative event given end-stage renal disease and morbid obesity. RCRI score of 1 putting her at 1.1% risk of cardiac event  Will obtain echocardiogram for evaluation of structural heart disease. If this is normal no further cardiac testing indicated prior to surgery, per [facility] AHA guidelines as she is able to do greater than 4 METS prior to noncardiac surgery. As needed follow-up. - 03/09/2024 TTE showed normal LV/RV systolic function, trace TR.   PCP Mercer Clotilda SAUNDERS, MD signed a surgical clearance letter from a medical and cardiac standpoint.   Nephrology input per Dr. Marlee,  cleared for surgery from a medical/nephrology standpoint.  Avoid surgery on a Monday if possible.  She had recent pulmonology follow-up with Dr.  Shellia on 04/20/2024. She was compliant with CPAP. Arranged for new auto CPAP 5-18 cm H2O. Discussed consideration of Zepbound  for weight. 2 month follow-up planned.   Anesthesia team to evaluate on the day of surgery. She has a clinical FYI of Blood Products Refusal. [facility] notes indicated AVF is located in her RUE.   VS: Ht 5' 8 (1.727 m)   Wt 133 kg   LMP  (LMP Unknown) Comment: patient is taking AYGESTIN  BMI 44.58 kg/m  BP Readings from Last 3 Encounters:  02/24/24 136/80  01/23/24 (!) 114/45  01/04/24 138/77   Pulse Readings from Last 3 Encounters:  02/24/24 88  01/23/24 82  01/04/24 86    PROVIDERS: Mercer Clotilda SAUNDERS, MD is PCP  Marlee Motto, MD is nephrologist Shellia Oh, MD is pulmonologist Inez Rankins, MD is cardiologist [place]). As needed follow-up 03/07/2024.    LABS: For day of surgery given ESRD. Most recent results in [facility] include: Lab Results  Component Value Date   WBC 7.9 01/23/2024   HGB 10.1 (L) 01/23/2024   HCT 30.5 (L) 01/23/2024   PLT 153 01/23/2024   GLUCOSE 87 01/23/2024   NA 139 01/23/2024   K 4.1 01/23/2024   CL 96 (L) 01/23/2024   CREATININE 13.00 (H) 01/23/2024   BUN 57 (H) 01/23/2024   CO2 26 01/23/2024   HGBA1C 5.4 02/24/2024    Per [facility] records: Sleep Tests  HST 01/05/19 >> AHI 54.3, SpO2 low 57% Auto CPAP 03/20/24 to 04/18/24 >> used on 30 of 30 nights with average 7 hrs 52 min. Average AHI 2.6 with median CPAP 12 and 95  th percentile CPAP 15 cm H2O    IMAGES: CTA Chest 01/23/2024: IMPRESSION: 1. Suboptimal contrast bolus for pulmonary artery assessment. There is no obvious central pulmonary embolus in the central pulmonary arteries to the lobar level. The segmental and subsegmental branches are not well assessed. 2. No acute intrathoracic abnormality.  MRI Left Shoulder 01/22/2024: IMPRESSION: - Mild insertional tendinopathy of the supraspinatus tendon without tear. - Mild acromioclavicular  osteoarthritis.  MRI Right Should 01/22/2024: IMPRESSION: - Mild to moderate insertional tendinopathy of the supraspinatus tendon and a small interstitial partial tear anteriorly. No retraction or full-thickness tear. - Mild acromioclavicular osteoarthritis.   EKG: EKG  03/07/2024 ([facility]): Per Narrative in Care Everywhere: NSR   CV: Echo 03/09/2024 ([facility]): Normal LV systolic function  Normal RV systolic function  Other findings as noted in the body of the report   - Left ventricle size is normal. Wall thickness is normal. Systolic function is normal. EF: 55-60%. Wall motion is normal. Doppler parameters indicate normal diastolic function.  - Right ventricle size is normal. Systolic function is normal.  - Left atrium size is normal.  - Right atrium size is normal.  - Mitral valve structure is normal. There is no mitral regurgitation.  - Tricuspid valve structure is normal. There is trace regurgitation. The right ventricular systolic pressure is normal (<36 mmHg).  - Aortic valve is tricuspid. The leaflets are not thickened and exhibit normal excursion. There is no regurgitation or stenosis.  - Pulmonic valve was not well visualized. Trace regurgitation. There is no evidence of pulmonic valve stenosis.  - The aortic root is normal in size. The ascending aorta is normal in size.  - There is no pericardial effusion.    LE Venous US  01/23/2024: IMPRESSION: The distal segment of the femoral vein was unable to be evaluated for compression as the patient was unable to tolerate compression in this region. Otherwise, no deep venous thrombosis within the visualized veins of the left lower extremity.    Nuclear stress test 05/21/2020: There was no ST segment deviation noted during stress. The left ventricular ejection fraction is normal (55-65%). Nuclear stress EF: 61%. The study is normal. This is a low risk study.   Fixed inferior perfusion defect with normal wall motion,  consistent with artifact  Past Medical History:  Diagnosis Date   Allergy    Anemia    Anxiety    Depression    DUB (dysfunctional uterine bleeding) 06/24/2016   End stage renal disease (HCC)    Dialysis Mon-Wed-Fri   Fatty liver    GERD (gastroesophageal reflux disease)    HLD (hyperlipidemia)    HTN (hypertension) 2007   with pregnancy but resolved after delivery   Lupus    Osteoarthritis    shoulders   Ovarian cyst 09/01/2016   Peripheral vascular disease    Pneumonia    x 1   Renal disorder    Sleep apnea     Past Surgical History:  Procedure Laterality Date   AV FISTULA PLACEMENT  2016 right, 2009 left   left and right are both working    CESAREAN SECTION     x 1   COLONOSCOPY     OTHER SURGICAL HISTORY Left    AV fistula stents, arm   TUBAL LIGATION  2010   UPPER GI ENDOSCOPY      MEDICATIONS: No current facility-administered medications for this encounter.    aspirin  EC 325 MG tablet   cetirizine (ZYRTEC) 10 MG tablet  cinacalcet (SENSIPAR) 90 MG tablet   docusate sodium (COLACE) 100 MG capsule   esomeprazole  (NEXIUM ) 40 MG capsule   esomeprazole  (NEXIUM ) 40 MG capsule   famotidine  (PEPCID ) 20 MG tablet   famotidine  (PEPCID ) 20 MG tablet   linaclotide  (LINZESS ) 145 MCG CAPS capsule   linaclotide  (LINZESS ) 145 MCG CAPS capsule   norethindrone (AYGESTIN) 5 MG tablet   oxyCODONE  (ROXICODONE ) 5 MG immediate release tablet   oxyCODONE -acetaminophen  (PERCOCET/ROXICET) 5-325 MG tablet   sevelamer carbonate (RENVELA) 800 MG tablet   SUPER B COMPLEX/C PO   triamcinolone  cream (KENALOG ) 0.1 %   Zinc 50 MG TABS  ASA is prescribed for post-op.  Isaiah Ruder, PA-C Surgical Short Stay/Anesthesiology [facility] Phone [phone] [facility] Phone [phone] 04/24/2024 3:49 PM

## 2024-04-24 NOTE — Anesthesia Preprocedure Evaluation (Signed)
 Anesthesia Evaluation  Patient identified by MRN, date of birth, ID band Patient awake    Reviewed: Allergy & Precautions, NPO status , Patient's Chart, lab work & pertinent test results  History of Anesthesia Complications Negative for: history of anesthetic complications  Airway Mallampati: I  TM Distance: <3 FB Neck ROM: Full   Comment: Thick, webbed neck.  Dental no notable dental hx. (+) Dental Advisory Given   Pulmonary sleep apnea and Continuous Positive Airway Pressure Ventilation , former smoker   breath sounds clear to auscultation       Cardiovascular hypertension,  Rhythm:Regular Rate:Normal     Neuro/Psych   Anxiety        GI/Hepatic ,GERD  ,,  Endo/Other  diabetes, Type 2    Renal/GU ESRF and DialysisRenal disease     Musculoskeletal   Abdominal   Peds  Hematology  (+) Blood dyscrasia, anemia   Anesthesia Other Findings   Reproductive/Obstetrics                              Anesthesia Physical Anesthesia Plan  ASA: 3  Anesthesia Plan: General   Post-op Pain Management:    Induction:   PONV Risk Score and Plan: 1  Airway Management Planned: Oral ETT  Additional Equipment:   Intra-op Plan:   Post-operative Plan: Extubation in OR  Informed Consent:   Plan Discussed with:   Anesthesia Plan Comments: (PAT note written 04/24/2024 by Isaiah Ruder, PA-C. ESRD, HD MWF. Has clinical FYI of Blood products refusal.  )         Anesthesia Quick Evaluation

## 2024-04-25 ENCOUNTER — Other Ambulatory Visit: Payer: Self-pay

## 2024-04-25 ENCOUNTER — Ambulatory Visit (HOSPITAL_COMMUNITY): Admitting: Vascular Surgery

## 2024-04-25 ENCOUNTER — Ambulatory Visit (HOSPITAL_COMMUNITY)
Admission: RE | Admit: 2024-04-25 | Discharge: 2024-04-25 | Disposition: A | Discharge status: Home / Self Care | Attending: Orthopaedic Surgery | Admitting: Orthopaedic Surgery

## 2024-04-25 ENCOUNTER — Encounter (HOSPITAL_COMMUNITY)
Admission: RE | Disposition: A | Discharge status: Home / Self Care | Payer: Self-pay | Source: Home / Self Care | Attending: Orthopaedic Surgery

## 2024-04-25 DIAGNOSIS — K219 Gastro-esophageal reflux disease without esophagitis: Secondary | ICD-10-CM | POA: Diagnosis not present

## 2024-04-25 DIAGNOSIS — M7521 Bicipital tendinitis, right shoulder: Secondary | ICD-10-CM

## 2024-04-25 DIAGNOSIS — Z01818 Encounter for other preprocedural examination: Secondary | ICD-10-CM

## 2024-04-25 DIAGNOSIS — E1122 Type 2 diabetes mellitus with diabetic chronic kidney disease: Secondary | ICD-10-CM | POA: Diagnosis not present

## 2024-04-25 DIAGNOSIS — I120 Hypertensive chronic kidney disease with stage 5 chronic kidney disease or end stage renal disease: Secondary | ICD-10-CM | POA: Diagnosis not present

## 2024-04-25 DIAGNOSIS — M7541 Impingement syndrome of right shoulder: Secondary | ICD-10-CM | POA: Diagnosis present

## 2024-04-25 DIAGNOSIS — M7502 Adhesive capsulitis of left shoulder: Secondary | ICD-10-CM | POA: Diagnosis present

## 2024-04-25 DIAGNOSIS — G4730 Sleep apnea, unspecified: Secondary | ICD-10-CM | POA: Insufficient documentation

## 2024-04-25 DIAGNOSIS — M7582 Other shoulder lesions, left shoulder: Secondary | ICD-10-CM

## 2024-04-25 DIAGNOSIS — N186 End stage renal disease: Secondary | ICD-10-CM

## 2024-04-25 DIAGNOSIS — M7581 Other shoulder lesions, right shoulder: Secondary | ICD-10-CM

## 2024-04-25 DIAGNOSIS — Z87891 Personal history of nicotine dependence: Secondary | ICD-10-CM | POA: Diagnosis not present

## 2024-04-25 DIAGNOSIS — M19011 Primary osteoarthritis, right shoulder: Secondary | ICD-10-CM | POA: Diagnosis not present

## 2024-04-25 HISTORY — PX: POSTERIOR LUMBAR FUSION 2 WITH HARDWARE REMOVAL: SHX7297

## 2024-04-25 HISTORY — DX: Unspecified osteoarthritis, unspecified site: M19.90

## 2024-04-25 HISTORY — DX: Peripheral vascular disease, unspecified: I73.9

## 2024-04-25 HISTORY — PX: SHOULDER INJECTION: SHX5048

## 2024-04-25 HISTORY — DX: Pneumonia, unspecified organism: J18.9

## 2024-04-25 HISTORY — PX: BICEPT TENODESIS: SHX5116

## 2024-04-25 HISTORY — DX: Fatty (change of) liver, not elsewhere classified: K76.0

## 2024-04-25 LAB — POCT I-STAT, CHEM 8
BUN: 52 mg/dL — ABNORMAL HIGH (ref 6–20)
Calcium, Ion: 0.83 mmol/L — CL (ref 1.15–1.40)
Chloride: 99 mmol/L (ref 98–111)
Creatinine, Ser: 12.2 mg/dL — ABNORMAL HIGH (ref 0.44–1.00)
Glucose, Bld: 99 mg/dL (ref 70–99)
HCT: 32 % — ABNORMAL LOW (ref 36.0–46.0)
Hemoglobin: 10.9 g/dL — ABNORMAL LOW (ref 12.0–15.0)
Potassium: 5.1 mmol/L (ref 3.5–5.1)
Sodium: 136 mmol/L (ref 135–145)
TCO2: 33 mmol/L — ABNORMAL HIGH (ref 22–32)

## 2024-04-25 LAB — NO BLOOD PRODUCTS

## 2024-04-25 LAB — HCG, SERUM, QUALITATIVE: Preg, Serum: NEGATIVE

## 2024-04-25 SURGERY — ARTHROSCOPY, SHOULDER WITH DEBRIDEMENT
Anesthesia: General | Site: Shoulder | Laterality: Right

## 2024-04-25 MED ORDER — LIDOCAINE 2% (20 MG/ML) 5 ML SYRINGE
INTRAMUSCULAR | Status: AC
Start: 2024-04-25 — End: 2024-04-25
  Filled 2024-04-25: qty 5

## 2024-04-25 MED ORDER — LIDOCAINE 2% (20 MG/ML) 5 ML SYRINGE
INTRAMUSCULAR | Status: DC | PRN
  Administered 2024-04-25: 100 mg via INTRAVENOUS

## 2024-04-25 MED ORDER — DROPERIDOL 2.5 MG/ML IJ SOLN: 0.6250 mg | Freq: Once | INTRAMUSCULAR | Status: DC | PRN

## 2024-04-25 MED ORDER — FENTANYL CITRATE (PF) 250 MCG/5ML IJ SOLN
INTRAMUSCULAR | Status: AC
  Filled 2024-04-25: qty 5

## 2024-04-25 MED ORDER — TRIAMCINOLONE ACETONIDE 40 MG/ML IJ SUSP
INTRAMUSCULAR | Status: AC
  Filled 2024-04-25: qty 5

## 2024-04-25 MED ORDER — TRANEXAMIC ACID-NACL 1000-0.7 MG/100ML-% IV SOLN: 1000.0000 mg | INTRAVENOUS | Status: DC

## 2024-04-25 MED ORDER — DEXAMETHASONE SODIUM PHOSPHATE 10 MG/ML IJ SOLN
INTRAMUSCULAR | Status: DC | PRN
  Administered 2024-04-25: 10 mg via INTRAVENOUS

## 2024-04-25 MED ORDER — ACETAMINOPHEN 500 MG PO TABS
ORAL_TABLET | ORAL | Status: AC
  Administered 2024-04-25: 1000 mg via ORAL
  Filled 2024-04-25: qty 2

## 2024-04-25 MED ORDER — ORAL CARE MOUTH RINSE: 15.0000 mL | Freq: Once | OROMUCOSAL | Status: AC

## 2024-04-25 MED ORDER — TRIAMCINOLONE ACETONIDE 40 MG/ML IJ SUSP
INTRAMUSCULAR | Status: DC | PRN
  Administered 2024-04-25: 5 mL

## 2024-04-25 MED ORDER — EPINEPHRINE PF 1 MG/ML IJ SOLN
INTRAMUSCULAR | Status: DC | PRN
  Administered 2024-04-25: .3 mg

## 2024-04-25 MED ORDER — FENTANYL CITRATE (PF) 250 MCG/5ML IJ SOLN
INTRAMUSCULAR | Status: DC | PRN
  Administered 2024-04-25: 100 ug via INTRAVENOUS

## 2024-04-25 MED ORDER — FENTANYL CITRATE (PF) 100 MCG/2ML IJ SOLN
INTRAMUSCULAR | Status: AC
  Filled 2024-04-25: qty 2

## 2024-04-25 MED ORDER — PHENYLEPHRINE 80 MCG/ML (10ML) SYRINGE FOR IV PUSH (FOR BLOOD PRESSURE SUPPORT)
PREFILLED_SYRINGE | INTRAVENOUS | Status: AC
  Filled 2024-04-25: qty 10

## 2024-04-25 MED ORDER — MIDAZOLAM HCL 2 MG/2ML IJ SOLN
INTRAMUSCULAR | Status: DC | PRN
  Administered 2024-04-25: 2 mg via INTRAVENOUS

## 2024-04-25 MED ORDER — EPINEPHRINE PF 1 MG/ML IJ SOLN
INTRAMUSCULAR | Status: AC
  Filled 2024-04-25: qty 1

## 2024-04-25 MED ORDER — ACETAMINOPHEN 500 MG PO TABS: 1000.0000 mg | ORAL_TABLET | Freq: Once | ORAL | Status: AC

## 2024-04-25 MED ORDER — ROCURONIUM BROMIDE 10 MG/ML (PF) SYRINGE
PREFILLED_SYRINGE | INTRAVENOUS | Status: DC | PRN
  Administered 2024-04-25: 60 mg via INTRAVENOUS

## 2024-04-25 MED ORDER — CHLORHEXIDINE GLUCONATE 0.12 % MT SOLN: 15.0000 mL | Freq: Once | OROMUCOSAL | Status: AC

## 2024-04-25 MED ORDER — DEXAMETHASONE SODIUM PHOSPHATE 10 MG/ML IJ SOLN
INTRAMUSCULAR | Status: AC
  Filled 2024-04-25: qty 1

## 2024-04-25 MED ORDER — OXYCODONE HCL 5 MG PO TABS: 5.0000 mg | ORAL_TABLET | ORAL | 0 refills | Status: AC | PRN

## 2024-04-25 MED ORDER — TRANEXAMIC ACID-NACL 1000-0.7 MG/100ML-% IV SOLN
INTRAVENOUS | Status: AC
  Filled 2024-04-25: qty 100

## 2024-04-25 MED ORDER — MIDAZOLAM HCL 2 MG/2ML IJ SOLN
INTRAMUSCULAR | Status: AC
  Filled 2024-04-25: qty 2

## 2024-04-25 MED ORDER — OXYCODONE HCL 5 MG/5ML PO SOLN: 5.0000 mg | Freq: Once | ORAL | Status: AC | PRN

## 2024-04-25 MED ORDER — PHENYLEPHRINE HCL-NACL 20-0.9 MG/250ML-% IV SOLN
INTRAVENOUS | Status: DC | PRN
  Administered 2024-04-25: 50 ug/min via INTRAVENOUS

## 2024-04-25 MED ORDER — PROPOFOL 10 MG/ML IV BOLUS
INTRAVENOUS | Status: DC | PRN
  Administered 2024-04-25: 140 mg via INTRAVENOUS

## 2024-04-25 MED ORDER — FENTANYL CITRATE (PF) 100 MCG/2ML IJ SOLN
25.0000 ug | INTRAMUSCULAR | Status: DC | PRN
  Administered 2024-04-25: 50 ug via INTRAVENOUS

## 2024-04-25 MED ORDER — OXYCODONE HCL 5 MG PO TABS
ORAL_TABLET | ORAL | Status: AC
  Filled 2024-04-25: qty 1

## 2024-04-25 MED ORDER — CHLORHEXIDINE GLUCONATE 0.12 % MT SOLN
OROMUCOSAL | Status: AC
  Administered 2024-04-25: 15 mL via OROMUCOSAL
  Filled 2024-04-25: qty 15

## 2024-04-25 MED ORDER — CEFAZOLIN SODIUM-DEXTROSE 2-4 GM/100ML-% IV SOLN
INTRAVENOUS | Status: AC
  Filled 2024-04-25: qty 100

## 2024-04-25 MED ORDER — ONDANSETRON HCL 4 MG/2ML IJ SOLN
INTRAMUSCULAR | Status: AC
  Filled 2024-04-25: qty 2

## 2024-04-25 MED ORDER — ONDANSETRON HCL 4 MG/2ML IJ SOLN: 4.0000 mg | Freq: Once | INTRAMUSCULAR | Status: DC | PRN

## 2024-04-25 MED ORDER — CEFAZOLIN SODIUM-DEXTROSE 3-4 GM/150ML-% IV SOLN
3.0000 g | INTRAVENOUS | Status: AC
  Administered 2024-04-25: 3 g via INTRAVENOUS

## 2024-04-25 MED ORDER — TRANEXAMIC ACID-NACL 1000-0.7 MG/100ML-% IV SOLN
INTRAVENOUS | Status: DC | PRN
Start: 2024-04-25 — End: 2024-04-25
  Administered 2024-04-25: 1000 mg via INTRAVENOUS

## 2024-04-25 MED ORDER — ROCURONIUM BROMIDE 10 MG/ML (PF) SYRINGE
PREFILLED_SYRINGE | INTRAVENOUS | Status: AC
Start: 2024-04-25 — End: 2024-04-25
  Filled 2024-04-25: qty 10

## 2024-04-25 MED ORDER — OXYCODONE HCL 5 MG PO TABS
5.0000 mg | ORAL_TABLET | Freq: Once | ORAL | Status: AC | PRN
  Administered 2024-04-25: 5 mg via ORAL

## 2024-04-25 MED ORDER — SUGAMMADEX SODIUM 200 MG/2ML IV SOLN
INTRAVENOUS | Status: DC | PRN
Start: 2024-04-25 — End: 2024-04-25
  Administered 2024-04-25: 550 mg via INTRAVENOUS

## 2024-04-25 MED ORDER — SODIUM CHLORIDE 0.9 % IV SOLN: INTRAVENOUS | Status: DC | PRN

## 2024-04-25 MED ORDER — BUPIVACAINE HCL (PF) 0.25 % IJ SOLN
INTRAMUSCULAR | Status: AC
  Filled 2024-04-25: qty 30

## 2024-04-25 MED ORDER — PROPOFOL 10 MG/ML IV BOLUS
INTRAVENOUS | Status: AC
  Filled 2024-04-25: qty 20

## 2024-04-25 MED ORDER — GABAPENTIN 300 MG PO CAPS
ORAL_CAPSULE | ORAL | Status: AC
  Administered 2024-04-25: 300 mg via ORAL
  Filled 2024-04-25: qty 1

## 2024-04-25 MED ORDER — GABAPENTIN 300 MG PO CAPS: 300.0000 mg | ORAL_CAPSULE | Freq: Once | ORAL | Status: AC

## 2024-04-25 MED ORDER — ACETAMINOPHEN 10 MG/ML IV SOLN: 1000.0000 mg | Freq: Once | INTRAVENOUS | Status: DC | PRN

## 2024-04-25 MED ORDER — ONDANSETRON HCL 4 MG/2ML IJ SOLN
INTRAMUSCULAR | Status: DC | PRN
Start: 2024-04-25 — End: 2024-04-25
  Administered 2024-04-25: 4 mg via INTRAVENOUS

## 2024-04-25 SURGICAL SUPPLY — 50 items
ANCHOR QUATTRO LINK KNTLS 2.9 (Anchor) IMPLANT
BAG COUNTER SPONGE SURGICOUNT (BAG) IMPLANT
BNDG ELASTIC 6INX 5YD STR LF (GAUZE/BANDAGES/DRESSINGS) ×3 IMPLANT
CANN ARTH HYDRODAM HIP 8.5X75 (CANNULA) IMPLANT
CHLORAPREP W/TINT 26 (MISCELLANEOUS) ×3 IMPLANT
COVER SURGICAL LIGHT HANDLE (MISCELLANEOUS) ×3 IMPLANT
DRAPE INCISE IOBAN 66X45 STRL (DRAPES) ×6 IMPLANT
DRAPE U-SHAPE 47X51 STRL (DRAPES) ×6 IMPLANT
DRSG TEGADERM 4X4.75 (GAUZE/BANDAGES/DRESSINGS) ×9 IMPLANT
DW OUTFLOW CASSETTE/TUBE SET (MISCELLANEOUS) ×3 IMPLANT
ELECTRODE REM PT RTRN 9FT ADLT (ELECTROSURGICAL) ×3 IMPLANT
EXCALIBUR 3.8MM X 13CM (MISCELLANEOUS) IMPLANT
GAUZE SPONGE 4X4 12PLY STRL (GAUZE/BANDAGES/DRESSINGS) ×3 IMPLANT
GAUZE SPONGE 4X4 12PLY STRL LF (GAUZE/BANDAGES/DRESSINGS) ×3 IMPLANT
GLOVE BIO SURGEON STRL SZ 6 (GLOVE) ×9 IMPLANT
GLOVE BIOGEL PI IND STRL 6.5 (GLOVE) ×6 IMPLANT
GLOVE BIOGEL PI IND STRL 8 (GLOVE) ×6 IMPLANT
GLOVE INDICATOR 8.0 STRL GRN (GLOVE) ×3 IMPLANT
GOWN STRL REUS W/ TWL LRG LVL3 (GOWN DISPOSABLE) ×9 IMPLANT
KIT BASIN OR (CUSTOM PROCEDURE TRAY) ×3 IMPLANT
KIT TURNOVER KIT B (KITS) ×3 IMPLANT
MANIFOLD NEPTUNE II (INSTRUMENTS) ×3 IMPLANT
NDL SCORPION MULTI FIRE (NEEDLE) IMPLANT
NDL SPNL 18GX3.5 QUINCKE PK (NEEDLE) ×3 IMPLANT
NDL SUT 6 .5 CRC .975X.05 MAYO (NEEDLE) IMPLANT
NEEDLE SCORPION MULTI FIRE (NEEDLE) IMPLANT
NEEDLE SPNL 18GX3.5 QUINCKE PK (NEEDLE) ×2 IMPLANT
PACK SHOULDER (CUSTOM PROCEDURE TRAY) ×3 IMPLANT
PAD ARMBOARD POSITIONER FOAM (MISCELLANEOUS) ×6 IMPLANT
PROBE APOLLO 90XL (SURGICAL WAND) ×3 IMPLANT
RESTRAINT HEAD UNIVERSAL NS (MISCELLANEOUS) ×3 IMPLANT
SLING ARM IMMOBILIZER LRG (SOFTGOODS) IMPLANT
SOLN 0.9% NACL 1000 ML (IV SOLUTION) ×2 IMPLANT
SOLN 0.9% NACL POUR BTL 1000ML (IV SOLUTION) ×3 IMPLANT
SOLN STERILE WATER 1000 ML (IV SOLUTION) ×2 IMPLANT
SOLN STERILE WATER BTL 1000 ML (IV SOLUTION) ×3 IMPLANT
SPONGE T-LAP 4X18 ~~LOC~~+RFID (SPONGE) ×6 IMPLANT
STRIP CLOSURE SKIN 1/2X4 (GAUZE/BANDAGES/DRESSINGS) ×3 IMPLANT
SUCTION TUBE FRAZIER 10FR DISP (SUCTIONS) ×3 IMPLANT
SUT BROADBAND TAPE 2PK 1.5 (SUTURE) IMPLANT
SUT ETHILON 3 0 PS 1 (SUTURE) ×3 IMPLANT
SUT VIC AB 2-0 CT1 TAPERPNT 27 (SUTURE) ×3 IMPLANT
SUT VICRYL 0 UR6 27IN ABS (SUTURE) ×3 IMPLANT
SUT VICRYL 1 TIES 12X18 (SUTURE) ×3 IMPLANT
SUTURE FIBERWR #2 38 T-5 BLUE (SUTURE) IMPLANT
SUTURE TAPE 1.3 FIBERLOP 20 ST (SUTURE) IMPLANT
TOWEL GREEN STERILE (TOWEL DISPOSABLE) ×3 IMPLANT
TOWEL GREEN STERILE FF (TOWEL DISPOSABLE) ×3 IMPLANT
TUBING ARTHROSCOPY IRRIG 16FT (MISCELLANEOUS) ×3 IMPLANT
WAND ABLATOR APOLLO I90 (BUR) IMPLANT

## 2024-04-25 NOTE — H&P (Signed)
 Chief Complaint: Bilateral shoulder pain, right hip pain        History of Present Illness:        02/03/2024: Presents today for follow-up of bilateral shoulders.  She is here today for MRI discussion.  She is still having pain about both shoulders worse on the right than the left.  This is worse with any type of overhead activity     Presents today with some right shoulder pain consistent with her ongoing adhesive capsulitis.  There is some lateral shoulder pain now on the left as well.  This is worse with overhead activities.  Left shoulder does not have restricted range of motion although there is pain.  This is also worse with laying directly on it.   Cindy Luna is a 43 y.o. female with 2 months of atraumatic right shoulder pain.  She is currently on dialysis from end-stage renal disease as result of her lupus.  She states that she have any specific injury or incident to the right shoulder.  She has not noticed increasingly loss of motion and pain in the shoulder.  She is having significant difficulty feeling like she cannot sleep on the shoulder.  She states that she feels like it needs to pop.       Surgical History:   None   PMH/PSH/Family History/Social History/Meds/Allergies:         Past Medical History:  Diagnosis Date   Anemia     Depression     DUB (dysfunctional uterine bleeding) 06/24/2016   End stage renal disease (HCC)     HLD (hyperlipidemia)     HTN (hypertension)     Lupus     Ovarian cyst 09/01/2016   Renal disorder     Sleep apnea               Past Surgical History:  Procedure Laterality Date   AV FISTULA PLACEMENT   2016 right, 2009 left    left and right are both working    CESAREAN SECTION       OTHER SURGICAL HISTORY Left      AV fistula stents, arm   TUBAL LIGATION   2010        Social History         Socioeconomic History   Marital status: Single      Spouse name: Not on file   Number of children: 1    Years of education: Not on file   Highest education level: Bachelor's degree (e.g., BA, AB, BS)  Occupational History   Not on file  Tobacco Use   Smoking status: Former      Types: Cigars      Start date: 2018      Quit date: 07/2018      Years since quitting: 5.5   Smokeless tobacco: Never  Substance and Sexual Activity   Alcohol use: No   Drug use: No   Sexual activity: Yes      Partners: Male      Birth control/protection: None  Other Topics Concern   Not on file  Social History Narrative    ** Merged History Encounter **         Level of education: college     Employment: unemployed     Transportation: car     Exercise: no    Housing situation: apt    Relationships (safe): yes    Contact for message (voicemail): [phone]  Social Drivers of Acupuncturist Strain: Low Risk  (01/18/2024)    Overall Financial Resource Strain (CARDIA)     Difficulty of Paying Living Expenses: Not very hard  Food Insecurity: No Food Insecurity (01/18/2024)    Hunger Vital Sign     Worried About Running Out of Food in the Last Year: Never true     Ran Out of Food in the Last Year: Never true  Transportation Needs: No Transportation Needs (01/18/2024)    PRAPARE - Therapist, art (Medical): No     Lack of Transportation (Non-Medical): No  Physical Activity: Inactive (01/18/2024)    Exercise Vital Sign     Days of Exercise per Week: 0 days     Minutes of Exercise per Session: Not on file  Stress: No Stress Concern Present (01/18/2024)    [employer] of Occupational Health - Occupational Stress Questionnaire     Feeling of Stress: Only a little  Social Connections: Moderately Integrated (01/18/2024)    Social Connection and Isolation Panel     Frequency of Communication with Friends and Family: More than three times a week     Frequency of Social Gatherings with Friends and Family: More than three times a week     Attends Religious  Services: More than 4 times per year     Active Member of [employer] or Organizations: Yes     Attends Engineer, structural: More than 4 times per year     Marital Status: Never married         Family History  Problem Relation Age of Onset   Hypertension Mother     Diabetes Mother     Autism Daughter     Thyroid  disease Sister     Colon cancer Maternal Grandmother     Prostate cancer Maternal Grandfather     Breast cancer Maternal Aunt     Diabetes Other          both sides of the fam   Hypertension Other          both sides of the fam        Allergies      Allergies  Allergen Reactions   Azithromycin Hives   Benadryl [Diphenhydramine Hcl] Hives   Doxycycline Hyclate     Vancomycin Swelling   Adhesive [Tape] Rash            Current Outpatient Medications  Medication Sig Dispense Refill   acetaminophen  (TYLENOL ) 500 MG tablet Take 1 tablet (500 mg total) by mouth every 8 (eight) hours for 10 days. 30 tablet 0   aspirin  EC 325 MG tablet Take 1 tablet (325 mg total) by mouth daily. 14 tablet 0   oxyCODONE  (ROXICODONE ) 5 MG immediate release tablet Take 1 tablet (5 mg total) by mouth every 4 (four) hours as needed for severe pain (pain score 7-10) or breakthrough pain. 10 tablet 0   cephALEXin  (KEFLEX ) 250 MG capsule Take 1 capsule (250 mg total) by mouth daily on dialysis days after getting off dialysis. 7 capsule 0   cetirizine (ZYRTEC) 10 MG tablet Take 10 mg by mouth daily.       cinacalcet (SENSIPAR) 90 MG tablet Take 90 mg by mouth daily.       docusate sodium (COLACE) 100 MG capsule Take 100 mg by mouth 2 (two) times daily.       esomeprazole  (NEXIUM ) 40 MG  capsule Take 40 mg by mouth daily.       famotidine  (PEPCID ) 20 MG tablet Take 1 tablet (20 mg total) by mouth daily as directed for heartburn. 30 tablet 11   linaclotide  (LINZESS ) 145 MCG CAPS capsule Take 1 capsule (145 mcg total) by mouth daily as needed. 30 capsule 5   norethindrone (AYGESTIN) 5 MG tablet  Take 1 tablet by mouth daily.       oxyCODONE -acetaminophen  (PERCOCET/ROXICET) 5-325 MG tablet Take 1 tablet by mouth every 6 (six) hours as needed for severe pain (pain score 7-10). 15 tablet 0   sevelamer carbonate (RENVELA) 800 MG tablet Take 800 mg by mouth. Take 5 tablets three times daily with meal and 2 tablets two times with snack       SUPER B COMPLEX/C PO         triamcinolone  cream (KENALOG ) 0.1 % Apply 1 application topically 2 (two) times daily. 30 g 0   Zinc 50 MG TABS            No current facility-administered medications for this visit.      Imaging Results (Last 48 hours)  No results found.     Review of Systems:   A ROS was performed including pertinent positives and negatives as documented in the HPI.   Physical Exam :   Constitutional: NAD and appears stated age Neurological: Alert and oriented Psych: Appropriate affect and cooperative There were no vitals taken for this visit.    Comprehensive Musculoskeletal Exam:     Musculoskeletal Exam      Inspection Right Left  Skin No atrophy or winging No atrophy or winging  Palpation      Tenderness Glenohumeral joint Lateral deltoid  Range of Motion      Flexion (passive) 170 170  Flexion (active) 160 160  Extension 30 30  Abduction 170 170  ER at the side 70 70  ER at 90 of abduction      IR at 90 of abduction      Can reach behind back to L3 L3  Strength        Limited due to pain Normal  Special Tests      Pseudoparalytic No No  Neurologic      Fires PIN, radial, median, ulnar, musculocutaneous, axillary, suprascapular, long thoracic, and spinal accessory innervated muscles. No abnormal sensibility  Vascular/Lymphatic      Radial Pulse 2+ 2+  Cervical Exam      Patient has symmetric cervical range of motion with negative Spurling's test.  Special Test: Positive pain with passive external rotation        Imaging:   Xray (right shoulder 3 views normal): Normal   MRI right shoulder, MRI left  shoulder: Evidence of bilateral rotator cuff impingement with bursal sided tearing as well as biceps fraying and inferior capsular thickness   I personally reviewed and interpreted the radiographs.     Assessment:   43 year old female with bilateral shoulder pain in the setting rotator cuff impingement and mild shoulder capsulitis.  I did also describe that her biceps are significantly painful on both sides.  At this time she has trialed injections without any relief.  She has trialed therapy and strengthening without any relief.  Given this I did discuss the role for possible shoulder arthroscopy with biceps tenodesis, acromioplasty and distal clavicle resection as well as debridement of the rotator interval.  I did discuss we could also perform an injection on the left shoulder  as well to hopefully get her some relief at the time of surgery.  I discussed the risks and benefits as well as limitations associated with this.  I discussed recovery timeframe after discussion she would like to proceed with the right side   Plan :     -plan for right shoulder arthroscopy with acromioplasty, distal clavicle excision, biceps tenodesis, rotator interval debridement as well as left shoulder subacromial injection       After a lengthy discussion of treatment options, including risks, benefits, alternatives, complications of surgical and nonsurgical conservative options, the patient elected surgical repair.    The patient  is aware of the material risks  and complications including, but not limited to injury to adjacent structures, neurovascular injury, infection, numbness, bleeding, implant failure, thermal burns, stiffness, persistent pain, failure to heal, disease transmission from allograft, need for further surgery, dislocation, anesthetic risks, blood clots, risks of death,and others. The probabilities of surgical success and failure discussed with patient given their particular co-morbidities.The time  and nature of expected rehabilitation and recovery was discussed.The patient's questions were all answered preoperatively.  No barriers to understanding were noted. I explained the natural history of the disease process and Rx rationale.  I explained to the patient what I considered to be reasonable expectations given their personal situation.  The final treatment plan was arrived at through a shared patient decision making process model.         Elspeth Parker, MD Attending Physician, Orthopedic Surgery   This document was dictated using Dragon voice recognition software. A reasonable attempt at proof reading has been made to minimize errors.        Electronically signed by

## 2024-04-25 NOTE — Discharge Instructions (Signed)
 Discharge Instructions    Attending Surgeon: Elspeth Parker, MD Office Phone Number: [phone]   Diagnosis and Procedures:    Surgeries Performed: Right shoulder biceps tenodesis, acromial debridement, distal clavicle resection  Discharge Plan:    Diet: Resume usual diet. Begin with light or bland foods.  Drink plenty of fluids.  Activity:  Keep sling and dressing in place until your follow up visit in Physical Therapy You are advised to go home directly from the hospital or surgical center. Restrict your activities.  GENERAL INSTRUCTIONS: 1.  Keep your surgical site elevated above your heart for at least 5-7 days or longer to prevent swelling. This will improve your comfort and your overall recovery following surgery.     2. Please call Dr. Danetta office at [phone] with questions Monday-Friday during business hours. If no one answers, please leave a message and someone should get back to the patient within 24 hours. For emergencies please call 911 or proceed to the emergency room.   3. Patient to notify surgical team if experiences any of the following: Bowel/Bladder dysfunction, uncontrolled pain, nerve/muscle weakness, incision with increased drainage or redness, nausea/vomiting and Fever greater than 101.0 F.  Be alert for signs of infection including redness, streaking, odor, fever or chills. Be alert for excessive pain or bleeding and notify your surgeon immediately.  WOUND INSTRUCTIONS:   Leave your dressing/cast/splint in place until your post operative visit.  Keep it clean and dry.  Always keep the incision clean and dry until the staples/sutures are removed. If there is no drainage from the incision you should keep it open to air. If there is drainage from the incision you must keep it covered at all times until the drainage stops  Do not soak in a bath tub, hot tub, pool, lake or other body of water until 21 days after your surgery and your incision is  completely dry and healed.  If you have removable sutures (or staples) they must be removed 10-14 days (unless otherwise instructed) from the day of your surgery.     1)  Elevate the extremity as much as possible.  2)  Keep the dressing clean and dry.  3)  Please call us  if the dressing becomes wet or dirty.  4)  If you are experiencing worsening pain or worsening swelling, please call.     MEDICATIONS: Resume all previous home medications at the previous prescribed dose and frequency unless otherwise noted Start taking the  pain medications on an as-needed basis as prescribed  Please taper down pain medication over the next week following surgery.  Ideally you should not require a refill of any narcotic pain medication.  Take pain medication with food to minimize nausea. In addition to the prescribed pain medication, you may take over-the-counter pain relievers such as Tylenol .  Do NOT take additional tylenol  if your pain medication already has tylenol  in it.  Aspirin  325mg  daily per bottle instructions. Narcotic Policy: Per [facility] clinic policy, our goal is ensure optimal postoperative pain control with a multimodal pain management strategy. For all [facility] patients, our goal is to wean post-operative narcotic medications by 6 weeks post-operatively, and many times sooner. If this is not possible due to utilization of pain medication prior to surgery, your [facility] doctor will support your acute post-operative pain control for the first 6 weeks postoperatively, with a plan to transition you back to your primary pain team following that. Maralee will work to ensure a Therapist, occupational.  FOLLOWUP INSTRUCTIONS: 1. Follow up at the Physical Therapy Clinic 3-4 days following surgery. This appointment should be scheduled unless other arrangements have been made.The Physical Therapy scheduling number is [phone] if an appointment has not already been arranged.  2. Contact Dr.  Danetta office during office hours at [phone] or the practice after hours line at [phone] for non-emergencies. For medical emergencies call 911.   Discharge Location: Home

## 2024-04-25 NOTE — Brief Op Note (Signed)
   Brief Op Note  Date of Surgery: 04/25/2024  Preoperative Diagnosis: RIGHT SHOULDER ROTATOR CUFF IMPINGEMENT, LEFT SHOULDER ADHESIVE CAPSULITIS  Postoperative Diagnosis: same  Procedure: Procedure(s): ARTHROSCOPY, SHOULDER WITH DEBRIDEMENT INJECTION, SHOULDER TENODESIS, BICEPS  Implants: Implant Name Type Inv. Item Serial No. Manufacturer Lot No. LRB No. Used Action  ANCHOR QUATTRO LINK KNTLS 2.9 - [id number] Anchor ANCHOR LEIGHTON HIGASHI KNTLS 2.9  [employer](ORTH,TRAU,BIO,SG) [id number] Right 1 Implanted    Surgeons: Surgeon(s): Genelle Standing, MD  Anesthesia: General    Estimated Blood Loss: See anesthesia record  Complications: None  Condition to PACU: Stable  Standing LITTIE Genelle, MD 04/25/2024 2:33 PM

## 2024-04-25 NOTE — Progress Notes (Signed)
 Per Dr. CANDIE Waddell Plowman ok for patient to proceed to OR with hCG serum qualitative test still in process. Patient is adamant that she is not pregnant and states she  takes norethindrone and has not had a menstrual cycle since 2018. Per Dr. Plowman ok to proceed as long as the patient understands the risks of not waiting on the test to result.

## 2024-04-25 NOTE — Op Note (Signed)
 Date of Surgery: 04/25/2024  INDICATIONS: Ms. Oplinger is a 43 y.o.-year-old female with right shoulder impingement and biceps tendonitis.  The risk and benefits of the procedure were discussed in detail and documented in the pre-operative evaluation.   PREOPERATIVE DIAGNOSES: Right shoulder, biceps tendinitis and subacromial impingement.  POSTOPERATIVE DIAGNOSIS: Same.  PROCEDURE: Arthroscopic extensive debridement - 29823 Subdeltoid Bursa, Supraspinatus Tendon, Anterior Labrum, and Superior Labrum Arthroscopic distal clavicle excision - 70175 Arthroscopic subacromial decompression - 70173 Arthroscopic biceps tenodesis - 70171 Left shoulder subacromial injection  SURGEON: Elspeth LITTIE Parker MD  ASSISTANT: Conley Funk, ATC  ANESTHESIA:  general  IV FLUIDS AND URINE: See anesthesia record.  ANTIBIOTICS: Ancef  ESTIMATED BLOOD LOSS: 5 mL.  IMPLANTS:  Implant Name Type Inv. Item Serial No. Manufacturer Lot No. LRB No. Used Action  ANCHOR QUATTRO LINK KNTLS 2.9 - [id number] Anchor ANCHOR QUATTRO LINK KNTLS 2.9  [employer](ORTH,TRAU,BIO,SG) [id number] Right 1 Implanted    DRAINS: None  CULTURES: None  COMPLICATIONS: none  PROCEDURE:    OPERATIVE FINDING: Exam under anesthesia:   Examination under anesthesia revealed forward elevation of 150 degrees.  With the arm at the side, there was 65 degrees of external rotation.  There is a 1+ anterior load shift and a 1+ posterior load shift.    Arthroscopic findings demonstrated: Articular space: Rotator interval redness/injection Chondral surfaces: Normal Biceps: Redness/fraying Subscapularis: Intact Supraspinatus: Undersurface tearing approximately 5 percent. Infraspinatus: Intact    I identified the patient in the pre-operative holding area.  I marked the operative right shoulder with my initials. I reviewed the risks and benefits of the proposed surgical intervention and the patient wished to proceed.  Anesthesia was then  performed with regional block.  The patient was transferred to the operative suite and placed in the beach chair position with all bony prominences padded.     SCDs were placed on bilateral lower extremity. Appropriate antibiotics was administered within 1 hour before incision.  Anesthesia was induced.  The operative extremity was then prepped and draped in standard fashion. A time out was performed confirming the correct extremity, correct patient and correct procedure.   The arthroscope was introduced in the glenohumeral joint from a posterior portal.  An anterior portal was created.  The shoulder was examined and the above findings were noted.     With an arthroscopic shaver and a wand ablator, synovitis throughout the  shoulder was resected.  The arthroscopic shaver was used to excise torn portions of the labrum back to a stable margin. Specifically this was done for the anterior and superior labrum. The rotator interval was debrided using shaver and electrocautery.  At this time the biceps was tagged with a self passing device with a #2 nonabsorbable suture twice.  This was done through the anterior portal.  This was placed in a single 2.9 Quattro link anchor.   The rotator cuff was then approached through the subacromial space. Anterior, lateral, and posterior portals were used.  Bursectomy was performed with an arthroscopic shaver and ArthroCare wand.  The soft tissues and 1 mm of bone on the undersurface of the acromion and clavicle were resected with the arthroscopic shaver and wand.     The shoulder was irrigated.  The arthroscopic instruments were removed.  Wounds were closed with 3-0 nylon sutures.  A sterile dressing was applied with xeroform, 4x8s, abdominal pad, and tape. An [name] was placed and the upper extremity was placed in a shoulder immobilizer.  The patient tolerated the procedure  well and was taken to the recovery room in stable condition.  All counts were correct in the case.  T  This time the left shoulder was injected in the subacromial space using an anterior lateral approach with 2 cc of 40 mg/cc Kenalog  and 5 cc of 0.25% Marcaine.  The patient tolerated the procedure well and was taken to the recovery room in stable condition.    POSTOPERATIVE PLAN: She follow the biceps repair protocol.  She may begin active range of motion once her nerve block wears off.  She replaced on aspirin  for blood clot prevention  Elspeth LITTIE Parker, MD 2:41 PM

## 2024-04-25 NOTE — Anesthesia Procedure Notes (Signed)
 Procedure Name: Intubation Date/Time: 04/25/2024 1:41 PM  Performed by: Obadiah Reyes BROCKS, CRNAPre-anesthesia Checklist: Patient identified, Emergency Drugs available, Suction available and Patient being monitored Patient Re-evaluated:Patient Re-evaluated prior to induction Oxygen Delivery Method: Circle System Utilized Preoxygenation: Pre-oxygenation with 100% oxygen Induction Type: IV induction Ventilation: Mask ventilation without difficulty Laryngoscope Size: Glidescope and 3 Grade View: Grade II Tube type: Oral Tube size: 7.0 mm Number of attempts: 1 Airway Equipment and Method: Stylet and Oral airway Placement Confirmation: ETT inserted through vocal cords under direct vision, positive ETCO2 and breath sounds checked- equal and bilateral Secured at: 22 cm Tube secured with: Tape Dental Injury: Teeth and Oropharynx as per pre-operative assessment  Difficulty Due To: Difficulty was anticipated and Difficult Airway- due to large tongue

## 2024-04-25 NOTE — Op Note (Signed)
 Date of Surgery: 04/25/2024  INDICATIONS: Cindy Luna is a 43 y.o.-year-old female with right shoulder impingement and biceps tendonitis.  The risk and benefits of the procedure were discussed in detail and documented in the pre-operative evaluation.   PREOPERATIVE DIAGNOSES: Right shoulder, biceps tendinitis and subacromial impingement.  POSTOPERATIVE DIAGNOSIS: Same.  PROCEDURE: Arthroscopic extensive debridement - 29823 Subdeltoid Bursa, Supraspinatus Tendon, Anterior Labrum, and Superior Labrum Arthroscopic distal clavicle excision - 70175 Arthroscopic subacromial decompression - 70173 Arthroscopic biceps tenodesis - 70171  SURGEON: Elspeth LITTIE Parker MD  ASSISTANT: Conley Funk, ATC  ANESTHESIA:  general  IV FLUIDS AND URINE: See anesthesia record.  ANTIBIOTICS: Ancef  ESTIMATED BLOOD LOSS: 5 mL.  IMPLANTS:  Implant Name Type Inv. Item Serial No. Manufacturer Lot No. LRB No. Used Action  ANCHOR QUATTRO LINK KNTLS 2.9 - [id number] Anchor ANCHOR QUATTRO LINK KNTLS 2.9  [employer](ORTH,TRAU,BIO,SG) [id number] Right 1 Implanted    DRAINS: None  CULTURES: None  COMPLICATIONS: none  PROCEDURE:    OPERATIVE FINDING: Exam under anesthesia:   Examination under anesthesia revealed forward elevation of 150 degrees.  With the arm at the side, there was 65 degrees of external rotation.  There is a 1+ anterior load shift and a 1+ posterior load shift.    Arthroscopic findings demonstrated: Articular space: Rotator interval redness/injection Chondral surfaces: Normal Biceps: Redness/fraying Subscapularis: Intact Supraspinatus: Undersurface tearing approximately 5 percent. Infraspinatus: Intact    I identified the patient in the pre-operative holding area.  I marked the operative right shoulder with my initials. I reviewed the risks and benefits of the proposed surgical intervention and the patient wished to proceed.  Anesthesia was then performed with regional block.  The  patient was transferred to the operative suite and placed in the beach chair position with all bony prominences padded.     SCDs were placed on bilateral lower extremity. Appropriate antibiotics was administered within 1 hour before incision.  Anesthesia was induced.  The operative extremity was then prepped and draped in standard fashion. A time out was performed confirming the correct extremity, correct patient and correct procedure.   The arthroscope was introduced in the glenohumeral joint from a posterior portal.  An anterior portal was created.  The shoulder was examined and the above findings were noted.     With an arthroscopic shaver and a wand ablator, synovitis throughout the  shoulder was resected.  The arthroscopic shaver was used to excise torn portions of the labrum back to a stable margin. Specifically this was done for the anterior and superior labrum. The rotator interval was debrided using shaver and electrocautery.  At this time the biceps was tagged with a self passing device with a #2 nonabsorbable suture twice.  This was done through the anterior portal.  This was placed in a single 2.9 Quattro link anchor.   The rotator cuff was then approached through the subacromial space. Anterior, lateral, and posterior portals were used.  Bursectomy was performed with an arthroscopic shaver and ArthroCare wand.  The soft tissues and 1 mm of bone on the undersurface of the acromion and clavicle were resected with the arthroscopic shaver and wand.     The shoulder was irrigated.  The arthroscopic instruments were removed.  Wounds were closed with 3-0 nylon sutures.  A sterile dressing was applied with xeroform, 4x8s, abdominal pad, and tape. An [name] was placed and the upper extremity was placed in a shoulder immobilizer.  The patient tolerated the procedure well and was taken  to the recovery room in stable condition.  All counts were correct in the case. The patient tolerated the procedure  well and was taken to the recovery room in stable condition.    POSTOPERATIVE PLAN: She follow the biceps repair protocol.  She may begin active range of motion once her nerve block wears off.  She replaced on aspirin  for blood clot prevention  Elspeth LITTIE Parker, MD 2:34 PM

## 2024-04-25 NOTE — Anesthesia Postprocedure Evaluation (Signed)
 Anesthesia Post Note  Patient: Cindy Luna  Procedure(s) Performed: ARTHROSCOPY, SHOULDER WITH DEBRIDEMENT (Right) INJECTION, SHOULDER (Left: Shoulder) TENODESIS, BICEPS (Right)     Patient location during evaluation: PACU Anesthesia Type: General Level of consciousness: awake Pain management: pain level controlled Vital Signs Assessment: post-procedure vital signs reviewed and stable Respiratory status: spontaneous breathing Cardiovascular status: blood pressure returned to baseline Postop Assessment: no apparent nausea or vomiting Anesthetic complications: no   No notable events documented.  Last Vitals:  Vitals:   04/25/24 1515 04/25/24 1530  BP: (!) 158/58 (!) 160/61  Pulse: 80 79  Resp: 18 19  Temp:  36.7 C  SpO2: 90% 99%    Last Pain:  Vitals:   04/25/24 1551  PainSc: 4                  Lauraine KATHEE [place]

## 2024-04-25 NOTE — Transfer of Care (Signed)
 Immediate Anesthesia Transfer of Care Note  Patient: Cindy Luna  Procedure(s) Performed: ARTHROSCOPY, SHOULDER WITH DEBRIDEMENT (Right) INJECTION, SHOULDER (Left: Shoulder) TENODESIS, BICEPS (Right)  Patient Location: PACU  Anesthesia Type:General  Level of Consciousness: awake, alert , and oriented  Airway & Oxygen Therapy: Patient Spontanous Breathing and Patient connected to face mask oxygen  Post-op Assessment: Report given to RN and Post -op Vital signs reviewed and stable  Post vital signs: Reviewed and stable  Last Vitals:  Vitals Value Taken Time  BP 151/70 04/25/24 14:53  Temp 36.8 C 04/25/24 14:53  Pulse 82 04/25/24 14:59  Resp 29 04/25/24 14:59  SpO2 100 % 04/25/24 14:59  Vitals shown include unfiled device data.  Last Pain:  Vitals:   04/25/24 1204  PainSc: 0-No pain         Complications: No notable events documented.

## 2024-04-26 ENCOUNTER — Encounter (HOSPITAL_COMMUNITY): Payer: Self-pay | Admitting: Orthopaedic Surgery

## 2024-04-26 ENCOUNTER — Telehealth (HOSPITAL_BASED_OUTPATIENT_CLINIC_OR_DEPARTMENT_OTHER): Payer: Self-pay | Admitting: Orthopaedic Surgery

## 2024-04-26 NOTE — Telephone Encounter (Signed)
 Returned call to patient informing her she may ice 30 mins on/30 mins off cycle

## 2024-04-26 NOTE — Telephone Encounter (Signed)
 Patient states she was sent home with an ice machine without instructions like when to use it ext. Best contact number [phone]

## 2024-04-27 ENCOUNTER — Encounter (HOSPITAL_BASED_OUTPATIENT_CLINIC_OR_DEPARTMENT_OTHER): Payer: Self-pay | Admitting: Physical Therapy

## 2024-04-27 ENCOUNTER — Other Ambulatory Visit: Payer: Self-pay

## 2024-04-27 ENCOUNTER — Ambulatory Visit (HOSPITAL_BASED_OUTPATIENT_CLINIC_OR_DEPARTMENT_OTHER): Attending: Orthopaedic Surgery | Admitting: Physical Therapy

## 2024-04-27 DIAGNOSIS — M6281 Muscle weakness (generalized): Secondary | ICD-10-CM | POA: Insufficient documentation

## 2024-04-27 DIAGNOSIS — M25511 Pain in right shoulder: Secondary | ICD-10-CM | POA: Insufficient documentation

## 2024-04-27 DIAGNOSIS — M7501 Adhesive capsulitis of right shoulder: Secondary | ICD-10-CM | POA: Diagnosis not present

## 2024-04-27 DIAGNOSIS — M25611 Stiffness of right shoulder, not elsewhere classified: Secondary | ICD-10-CM | POA: Insufficient documentation

## 2024-04-27 NOTE — Therapy (Unsigned)
 OUTPATIENT PHYSICAL THERAPY EVALUATION   Patient Name: Cindy Luna MRN: [id number] DOB:[birth year], 43 y.o., female Today's Date: 04/28/2024  END OF SESSION:  PT End of Session - 04/27/24 1549     Visit Number 1    Number of Visits 20    Date for Recertification  07/22/24    Authorization Type [employer] Dual Complete    Progress Note Due on Visit 10    PT Start Time 1515    PT Stop Time 1550    PT Time Calculation (min) 35 min    Activity Tolerance Patient tolerated treatment well    Behavior During Therapy [facility] for tasks assessed/performed          Past Medical History:  Diagnosis Date   Allergy    Anemia    Anxiety    Depression    DUB (dysfunctional uterine bleeding) 06/24/2016   End stage renal disease (HCC)    Dialysis Mon-Wed-Fri   Fatty liver    GERD (gastroesophageal reflux disease)    HLD (hyperlipidemia)    HTN (hypertension) 2007   with pregnancy but resolved after delivery   Lupus    Osteoarthritis    shoulders   Ovarian cyst 09/01/2016   Peripheral vascular disease    Pneumonia    x 1   Renal disorder    Sleep apnea    Past Surgical History:  Procedure Laterality Date   AV FISTULA PLACEMENT  2016 right, 2009 left   left and right are both working    BICEPT TENODESIS Right 04/25/2024   Procedure: TENODESIS, BICEPS;  Surgeon: Genelle Standing, MD;  Location: [facility] OR;  Service: Orthopedics;  Laterality: Right;   CESAREAN SECTION     x 1   COLONOSCOPY     OTHER SURGICAL HISTORY Left    AV fistula stents, arm   POSTERIOR LUMBAR FUSION 2 WITH HARDWARE REMOVAL Right 04/25/2024   Procedure: ARTHROSCOPY, SHOULDER WITH DEBRIDEMENT;  Surgeon: Genelle Standing, MD;  Location: [facility] OR;  Service: Orthopedics;  Laterality: Right;  RIGHT SHOULDER ARTHROSCOPY WITH DISTAL CLAVICLE RESECTION, ACROMIOPLASTY, BICEPS TENODESIS   SHOULDER INJECTION Left 04/25/2024   Procedure: INJECTION, SHOULDER;  Surgeon: Genelle Standing, MD;  Location: [facility] OR;  Service: Orthopedics;   Laterality: Left;  LEFT GLENOHUMERAL INJECTION   TUBAL LIGATION  2010   UPPER GI ENDOSCOPY     Patient Active Problem List   Diagnosis Date Noted   Biceps tendinitis of right upper extremity 04/25/2024   Arthrosis of right acromioclavicular joint 04/25/2024   Tendinitis of right rotator cuff 04/25/2024   Rotator cuff tendonitis, left 04/25/2024   Unilateral primary osteoarthritis, right hip 01/27/2024   DM (diabetes mellitus) (HCC)    Chronic constipation 11/16/2019   Eczema 11/16/2019   Environmental and seasonal allergies 07/17/2019   History of depression 07/17/2019   Patient is Jehovah's Witness 07/13/2019   Chest tightness 01/04/2019   Dysmenorrhea 05/03/2018   Class 3 severe obesity with serious comorbidity and body mass index (BMI) of 45.0 to 49.9 in adult (HCC) 04/18/2018   Lymphadenopathy of head and neck 09/29/2017   OSA (obstructive sleep apnea) 12/10/2014   ESRD (end stage renal disease) on dialysis (HCC) 11/19/2014   Lupus (HCC) 11/19/2014     REFERRING PROVIDER: Standing LITTIE Genelle MD   REFERRING DIAG:  M75.81 (ICD-10-CM) - Tendinitis of right rotator cuff     Arthroscopic extensive debridement - 29823 Subdeltoid Bursa, Supraspinatus Tendon, Anterior Labrum, and Superior Labrum Arthroscopic distal clavicle excision - 70175 Arthroscopic  subacromial decompression - 70173 Arthroscopic biceps tenodesis - 70171 Rationale for Evaluation and Treatment: Rehabilitation  THERAPY DIAG:  Acute pain of right shoulder  Stiffness of right shoulder, not elsewhere classified  Muscle weakness (generalized)  ONSET DATE: DOS 04/25/24   SUBJECTIVE:                                                                                                                                                                                           SUBJECTIVE STATEMENT: Had frozen shoulder in 2023 and that is what started this all.   PERTINENT HISTORY:  dialysis  PAIN:  Are you having  pain? Yes: NPRS scale: 3 at rest Pain location: Rt shoulder Pain description: sore Aggravating factors: movement Relieving factors: ice  PRECAUTIONS:  Dialysis- shoulder has to be still for 4 hours 3 days/week   RED FLAGS: None   WEIGHT BEARING RESTRICTIONS:  No  FALLS:  Has patient fallen in last 6 months? No   OCCUPATION:  Not working  PLOF:  Independent  PATIENT GOALS:  Have my mobility back  OBJECTIVE:  Note: Objective measures were completed at Evaluation unless otherwise noted.   PATIENT SURVEYS:  UEFS  Extreme difficulty/unable (0), Quite a bit of difficulty (1), Moderate difficulty (2), Little difficulty (3), No difficulty (4) Survey date:  10/3 EVAL  Any of your usual work, household or school activities 0  2. Your usual hobbies, recreational/sport activities 0   3. Lifting a bag of groceries to waist level 0   4. Lifting a bag of groceries above your head 0  5. Grooming your hair 0  6. Pushing up on your hands (I.e. from bathtub or chair) 0  7. Preparing food (I.e. peeling/cutting) 0  8. Driving  0  9. Vacuuming, sweeping, or raking 0  10. Dressing  1  11. Doing up buttons 3  12. Using tools/appliances 0  13. Opening doors 0  14. Cleaning  0  15. Tying or lacing shoes 1  16. Sleeping  2  17. Laundering clothes (I.e. washing, ironing, folding) 0  18. Opening a jar 1  19. Throwing a ball 0  20. Carrying a small suitcase with your affected limb.  0  Score total:  8/80     COGNITIVE STATUS: Within functional limits for tasks assessed   SENSATION: WFL  EDEMA:  No  POSTURE:  Eval: Rounded shoulder in sling as expected post op  HAND DOMINANCE:  Right   Body Part #1 Shoulder  PALPATION: EVAL: no s/s of infection  UPPER EXTREMITY ROM:  Passive ROM Right eval   Shoulder flexion 50 in pendulum  Shoulder extension    Shoulder abduction    Shoulder adduction    Shoulder extension    Shoulder internal rotation    Shoulder external  rotation    Elbow flexion WFL   Elbow extension -10   Wrist  WFL    (Blank rows = not tested)   TREATMENT DATE:   EVAL Changed bandages- no s/s of infection See HEP   PATIENT EDUCATION:  Education details: Anatomy of condition, POC, HEP, exercise form/rationale Person educated: Patient Education method: Explanation, Demonstration, Tactile cues, Verbal cues, and Handouts Education comprehension: verbalized understanding, returned demonstration, verbal cues required, tactile cues required, and needs further education   HOME EXERCISE PROGRAM: Access Code: [email or web address] URL: [email or web address] Date: 04/28/2024 Prepared by: Harlene Cordon  Exercises - Seated Scapular Retraction  - 5 x daily - 7 x weekly - 1 sets - 10 reps - Seated Neck Sidebending Stretch  - 5 x daily - 7 x weekly - 1 sets - 5 reps - 2 breaths hold - Seated Elbow Flexion AAROM  - 5 x daily - 7 x weekly - 1 sets - 10 reps - Putty Squeezes  - 3-5 x daily - 7 x weekly - 2-3 min hold - Circular Shoulder Pendulum with Table Support  - 3-5 x daily - 7 x weekly - 1 sets - 10 reps   ASSESSMENT:  CLINICAL IMPRESSION: Patient is a 42y.o. female who was seen today for physical therapy evaluation and treatment for status post surgical intervention to her right shoulder Including extensive debridement, distal clavicle excision, SA D, biceps tenodesis.SABRA     PERSONAL FACTORS: Patient is currently on dialysis 3 days/week are also affecting patient's functional outcome.   REHAB POTENTIAL: Good  CLINICAL DECISION MAKING: Stable/uncomplicated  EVALUATION COMPLEXITY: Low   GOALS: Goals reviewed with patient? Yes  SHORT TERM GOALS: Target date: 05/13/24  Patient will demonstrate full active range of motion of elbow without discomfort and without assistance Baseline: Goal status: INITIAL  2.  Demonstrate full passive range of motion flexion as well as active assisted range of motion on pulleys without  increase in pain Baseline:  Goal status: INITIAL  3.  Pain to stay below 5 out of 10 with daily activities Baseline:  Goal status: INITIAL  4.  Independent in self-care as mom will be leaving Baseline:  Goal status: INITIAL    LONG TERM GOALS: Target date: POC date  Full active range of motion of shoulder without compensatory patterns and without impingement Baseline:  Goal status: INITIAL  2.  UEFS to improve by MDC x 3 Baseline:  Goal status: INITIAL  3.  Gross shoulder strength to 85% of the opposite upper extremity Baseline:  Goal status: INITIAL  4.  Independent and long-term home exercise program for continued strength and stability Baseline:  Goal status: INITIAL    PLAN:  PT FREQUENCY: 1-2x/week  PT DURATION: Plan of care date  PLANNED INTERVENTIONS: 97164- PT Re-evaluation, 97750- Physical Performance Testing, 97110-Therapeutic exercises, 97530- Therapeutic activity, [id number]- Neuromuscular re-education, 97535- Self Care, 02859- Manual therapy, [phone]- Aquatic Therapy, [phone] (1-2 muscles), 20561 (3+ muscles)- Dry Needling, Patient/Family education, Taping, Joint mobilization, Spinal mobilization, Scar mobilization, and Cryotherapy.  PLAN FOR NEXT SESSION: Progression as tolerated considering labral repair and biceps tenodesis   Harlene Cordon, PT, DPT 04/28/2024, 7:44 AM

## 2024-04-28 ENCOUNTER — Telehealth (HOSPITAL_BASED_OUTPATIENT_CLINIC_OR_DEPARTMENT_OTHER): Payer: Self-pay | Admitting: Orthopaedic Surgery

## 2024-04-28 ENCOUNTER — Ambulatory Visit (HOSPITAL_BASED_OUTPATIENT_CLINIC_OR_DEPARTMENT_OTHER): Admitting: Physical Therapy

## 2024-04-28 NOTE — Telephone Encounter (Signed)
 Patient notified she can d/c sling

## 2024-04-28 NOTE — Telephone Encounter (Signed)
 Patient states that her PT said that her brace does not fit and she needs a new one. Best contact [phone]

## 2024-05-04 ENCOUNTER — Encounter (HOSPITAL_BASED_OUTPATIENT_CLINIC_OR_DEPARTMENT_OTHER): Payer: Self-pay | Admitting: Physical Therapy

## 2024-05-04 ENCOUNTER — Ambulatory Visit: Admitting: Orthopaedic Surgery

## 2024-05-04 ENCOUNTER — Ambulatory Visit (HOSPITAL_BASED_OUTPATIENT_CLINIC_OR_DEPARTMENT_OTHER): Admitting: Physical Therapy

## 2024-05-04 ENCOUNTER — Ambulatory Visit (INDEPENDENT_AMBULATORY_CARE_PROVIDER_SITE_OTHER): Admitting: Orthopaedic Surgery

## 2024-05-04 DIAGNOSIS — M25611 Stiffness of right shoulder, not elsewhere classified: Secondary | ICD-10-CM

## 2024-05-04 DIAGNOSIS — M7581 Other shoulder lesions, right shoulder: Secondary | ICD-10-CM

## 2024-05-04 DIAGNOSIS — M6281 Muscle weakness (generalized): Secondary | ICD-10-CM

## 2024-05-04 DIAGNOSIS — M25511 Pain in right shoulder: Secondary | ICD-10-CM | POA: Diagnosis not present

## 2024-05-04 NOTE — Progress Notes (Signed)
 Post Operative Evaluation    Procedure/Date of Surgery: Right shoulder arthroscopy with biceps tenodesis 9/30  Interval History:   Presents 2 weeks status post the above procedure.  Overall she is doing extremely well.  She has begun some gentle range of motion with physical therapy   PMH/PSH/Family History/Social History/Meds/Allergies:    Past Medical History:  Diagnosis Date   Allergy    Anemia    Anxiety    Depression    DUB (dysfunctional uterine bleeding) 06/24/2016   End stage renal disease (HCC)    Dialysis Mon-Wed-Fri   Fatty liver    GERD (gastroesophageal reflux disease)    HLD (hyperlipidemia)    HTN (hypertension) 2007   with pregnancy but resolved after delivery   Lupus    Osteoarthritis    shoulders   Ovarian cyst 09/01/2016   Peripheral vascular disease    Pneumonia    x 1   Renal disorder    Sleep apnea    Past Surgical History:  Procedure Laterality Date   AV FISTULA PLACEMENT  2016 right, 2009 left   left and right are both working    BICEPT TENODESIS Right 04/25/2024   Procedure: TENODESIS, BICEPS;  Surgeon: Genelle Standing, MD;  Location: [facility] OR;  Service: Orthopedics;  Laterality: Right;   CESAREAN SECTION     x 1   COLONOSCOPY     OTHER SURGICAL HISTORY Left    AV fistula stents, arm   POSTERIOR LUMBAR FUSION 2 WITH HARDWARE REMOVAL Right 04/25/2024   Procedure: ARTHROSCOPY, SHOULDER WITH DEBRIDEMENT;  Surgeon: Genelle Standing, MD;  Location: [facility] OR;  Service: Orthopedics;  Laterality: Right;  RIGHT SHOULDER ARTHROSCOPY WITH DISTAL CLAVICLE RESECTION, ACROMIOPLASTY, BICEPS TENODESIS   SHOULDER INJECTION Left 04/25/2024   Procedure: INJECTION, SHOULDER;  Surgeon: Genelle Standing, MD;  Location: [facility] OR;  Service: Orthopedics;  Laterality: Left;  LEFT GLENOHUMERAL INJECTION   TUBAL LIGATION  2010   UPPER GI ENDOSCOPY     Social History   Socioeconomic History   Marital status: Single    Spouse name: Not on  file   Number of children: 1   Years of education: Not on file   Highest education level: Bachelor's degree (e.g., BA, AB, BS)  Occupational History   Not on file  Tobacco Use   Smoking status: Former    Types: Cigars    Start date: 2018    Quit date: 07/2018    Years since quitting: 5.7   Smokeless tobacco: Never  Vaping Use   Vaping status: Never Used  Substance and Sexual Activity   Alcohol use: No   Drug use: No   Sexual activity: Not Currently    Partners: Male    Birth control/protection: Surgical    Comment: tubal ligation  Other Topics Concern   Not on file  Social History Narrative   ** Merged History Encounter **       Level of education: college    Employment: unemployed    Transportation: car    Exercise: no   Housing situation: apt   Relationships (safe): yes   Contact for message (voicemail): [phone]   Social Drivers of Health   Financial Resource Strain: Low Risk  (01/18/2024)   Overall Financial Resource Strain (CARDIA)    Difficulty of Paying Living Expenses: Not very  hard  Food Insecurity: Low Risk  (04/20/2024)   Received from [facility]   Hunger Vital Sign    Within the past 12 months, you worried that your food would run out before you got money to buy more: Never true    Within the past 12 months, the food you bought just didn't last and you didn't have money to get more. : Never true  Transportation Needs: No Transportation Needs (04/20/2024)   Received from [facility]   Transportation    In the past 12 months, has lack of reliable transportation kept you from medical appointments, meetings, work or from getting things needed for daily living? : No  Physical Activity: Inactive (01/18/2024)   Exercise Vital Sign    Days of Exercise per Week: 0 days    Minutes of Exercise per Session: Not on file  Stress: No Stress Concern Present (01/18/2024)   [employer] of Occupational Health - Occupational Stress Questionnaire    Feeling of  Stress: Only a little  Social Connections: Moderately Integrated (01/18/2024)   Social Connection and Isolation Panel    Frequency of Communication with Friends and Family: More than three times a week    Frequency of Social Gatherings with Friends and Family: More than three times a week    Attends Religious Services: More than 4 times per year    Active Member of [employer] or Organizations: Yes    Attends Engineer, structural: More than 4 times per year    Marital Status: Never married   Family History  Problem Relation Age of Onset   Drug abuse Father    Early death Father    Hypertension Mother    Diabetes Mother    Autism Daughter    ADD / ADHD Daughter    Learning disabilities Daughter    Thyroid  disease Sister    Colon cancer Maternal Grandmother    Cancer Maternal Grandmother    Prostate cancer Maternal Grandfather    Cancer Maternal Grandfather    Stroke Maternal Grandfather    Breast cancer Maternal Aunt    Hypertension Maternal Aunt    Diabetes Other        both sides of the fam   Hypertension Other        both sides of the fam   Alcohol abuse Maternal Uncle    Cancer Maternal Aunt    Depression Maternal Aunt    Diabetes Maternal Aunt    Diabetes Paternal Uncle    Hypertension Paternal Uncle    Allergies  Allergen Reactions   Azithromycin Hives   Benadryl [Diphenhydramine Hcl] Hives   Clindamycin /Lincomycin Itching    Redness   Doxycycline Hyclate    Vancomycin Swelling   Adhesive [Tape] Rash   Current Outpatient Medications  Medication Sig Dispense Refill   aspirin  EC 325 MG tablet Take 1 tablet (325 mg total) by mouth daily. (Patient taking differently: Take 325 mg by mouth daily. To be taken after surgery to prevent blood clots per pt.) 14 tablet 0   cetirizine (ZYRTEC) 10 MG tablet Take 10 mg by mouth daily.     cinacalcet (SENSIPAR) 90 MG tablet Take 120 mg by mouth daily. Takes two 60 mg tabs daily     docusate sodium (COLACE) 100 MG capsule  Take 300 mg by mouth daily.     esomeprazole  (NEXIUM ) 40 MG capsule Take 40 mg by mouth at bedtime.     esomeprazole  (NEXIUM ) 40 MG capsule Take 1 capsule (  40 mg total) by mouth daily. (Patient taking differently: Take 40 mg by mouth at bedtime.) 90 capsule 3   famotidine  (PEPCID ) 20 MG tablet Take 1 tablet (20 mg total) by mouth daily as directed for heartburn. (Patient taking differently: Take 20 mg by mouth at bedtime.) 30 tablet 11   famotidine  (PEPCID ) 20 MG tablet Take 1 tablet (20 mg total) by mouth daily as directed for heartburn. (Patient taking differently: Take 20 mg by mouth at bedtime.) 90 tablet 3   linaclotide  (LINZESS ) 145 MCG CAPS capsule Take 1 capsule (145 mcg total) by mouth daily as needed. 30 capsule 5   linaclotide  (LINZESS ) 145 MCG CAPS capsule Take 1 capsule (145 mcg total) by mouth daily as needed. 90 capsule 2   norethindrone (AYGESTIN) 5 MG tablet Take 1 tablet by mouth daily.     oxyCODONE  (ROXICODONE ) 5 MG immediate release tablet Take 1 tablet (5 mg total) by mouth every 4 (four) hours as needed for severe pain (pain score 7-10) or breakthrough pain. 20 tablet 0   sevelamer carbonate (RENVELA) 800 MG tablet Take 800 mg by mouth. Take 5 tablets three times daily with meal and 2 tablets two times with snack     SUPER B COMPLEX/C PO      triamcinolone  cream (KENALOG ) 0.1 % Apply 1 application topically 2 (two) times daily. 30 g 0   Zinc 50 MG TABS      No current facility-administered medications for this visit.   No results found.  Review of Systems:   A ROS was performed including pertinent positives and negatives as documented in the HPI.   Musculoskeletal Exam:    There were no vitals taken for this visit.  Right shoulder incisions are well-appearing without erythema or drainage.  Active forward elevation is to approximately 30 degrees although passively is to 90 in the spine position with external rotation at side to 30 degrees distal neurosensory seems  intact  Imaging:      I personally reviewed and interpreted the radiographs.   Assessment:   2 weeks status post right shoulder arthroscopy with biceps tenodesis doing well.  This time she will continue to work on active range of motion according to the biceps tenodesis protocol and I will see her back in 4 weeks to reassess  Plan :    - Return to clinic 4 weeks for reassessment      I personally saw and evaluated the patient, and participated in the management and treatment plan.  Elspeth Parker, MD Attending Physician, Orthopedic Surgery  This document was dictated using Dragon voice recognition software. A reasonable attempt at proof reading has been made to minimize errors.

## 2024-05-04 NOTE — Therapy (Signed)
 OUTPATIENT PHYSICAL THERAPY EVALUATION   Patient Name: CALYN SIVILS MRN: [id number] DOB:[birth year], 43 y.o., female Today's Date: 05/04/2024  END OF SESSION:  PT End of Session - 05/04/24 1350     Visit Number 2    Number of Visits 20    Date for Recertification  07/22/24    Authorization Type [employer] Dual Complete    Progress Note Due on Visit 10    PT Start Time 1351    PT Stop Time 1424    PT Time Calculation (min) 33 min    Activity Tolerance Patient tolerated treatment well    Behavior During Therapy [facility] for tasks assessed/performed           Past Medical History:  Diagnosis Date   Allergy    Anemia    Anxiety    Depression    DUB (dysfunctional uterine bleeding) 06/24/2016   End stage renal disease (HCC)    Dialysis Mon-Wed-Fri   Fatty liver    GERD (gastroesophageal reflux disease)    HLD (hyperlipidemia)    HTN (hypertension) 2007   with pregnancy but resolved after delivery   Lupus    Osteoarthritis    shoulders   Ovarian cyst 09/01/2016   Peripheral vascular disease    Pneumonia    x 1   Renal disorder    Sleep apnea    Past Surgical History:  Procedure Laterality Date   AV FISTULA PLACEMENT  2016 right, 2009 left   left and right are both working    BICEPT TENODESIS Right 04/25/2024   Procedure: TENODESIS, BICEPS;  Surgeon: Genelle Standing, MD;  Location: [facility] OR;  Service: Orthopedics;  Laterality: Right;   CESAREAN SECTION     x 1   COLONOSCOPY     OTHER SURGICAL HISTORY Left    AV fistula stents, arm   POSTERIOR LUMBAR FUSION 2 WITH HARDWARE REMOVAL Right 04/25/2024   Procedure: ARTHROSCOPY, SHOULDER WITH DEBRIDEMENT;  Surgeon: Genelle Standing, MD;  Location: [facility] OR;  Service: Orthopedics;  Laterality: Right;  RIGHT SHOULDER ARTHROSCOPY WITH DISTAL CLAVICLE RESECTION, ACROMIOPLASTY, BICEPS TENODESIS   SHOULDER INJECTION Left 04/25/2024   Procedure: INJECTION, SHOULDER;  Surgeon: Genelle Standing, MD;  Location: [facility] OR;  Service: Orthopedics;   Laterality: Left;  LEFT GLENOHUMERAL INJECTION   TUBAL LIGATION  2010   UPPER GI ENDOSCOPY     Patient Active Problem List   Diagnosis Date Noted   Biceps tendinitis of right upper extremity 04/25/2024   Arthrosis of right acromioclavicular joint 04/25/2024   Tendinitis of right rotator cuff 04/25/2024   Rotator cuff tendonitis, left 04/25/2024   Unilateral primary osteoarthritis, right hip 01/27/2024   DM (diabetes mellitus) (HCC)    Chronic constipation 11/16/2019   Eczema 11/16/2019   Environmental and seasonal allergies 07/17/2019   History of depression 07/17/2019   Patient is Jehovah's Witness 07/13/2019   Chest tightness 01/04/2019   Dysmenorrhea 05/03/2018   Class 3 severe obesity with serious comorbidity and body mass index (BMI) of 45.0 to 49.9 in adult (HCC) 04/18/2018   Lymphadenopathy of head and neck 09/29/2017   OSA (obstructive sleep apnea) 12/10/2014   ESRD (end stage renal disease) on dialysis (HCC) 11/19/2014   Lupus (HCC) 11/19/2014     REFERRING PROVIDER: Standing LITTIE Genelle MD   REFERRING DIAG:  M75.81 (ICD-10-CM) - Tendinitis of right rotator cuff     Arthroscopic extensive debridement - 29823 Subdeltoid Bursa, Supraspinatus Tendon, Anterior Labrum, and Superior Labrum Arthroscopic distal clavicle excision - 70175  Arthroscopic subacromial decompression - 70173 Arthroscopic biceps tenodesis - 70171 Rationale for Evaluation and Treatment: Rehabilitation  THERAPY DIAG:  Acute pain of right shoulder  Stiffness of right shoulder, not elsewhere classified  Muscle weakness (generalized)  ONSET DATE: DOS 04/25/24   SUBJECTIVE:                                                                                                                                                                                           SUBJECTIVE STATEMENT: A little sore after having stitches out this AM.   PERTINENT HISTORY:  dialysis  PAIN:  Are you having pain? Yes: NPRS  scale: 3 at rest Pain location: Rt shoulder Pain description: sore Aggravating factors: movement Relieving factors: ice  PRECAUTIONS:  Dialysis- shoulder has to be still for 4 hours 3 days/week   RED FLAGS: None   WEIGHT BEARING RESTRICTIONS:  No  FALLS:  Has patient fallen in last 6 months? No   OCCUPATION:  Not working  PLOF:  Independent  PATIENT GOALS:  Have my mobility back  OBJECTIVE:  Note: Objective measures were completed at Evaluation unless otherwise noted.   PATIENT SURVEYS:  UEFS  Extreme difficulty/unable (0), Quite a bit of difficulty (1), Moderate difficulty (2), Little difficulty (3), No difficulty (4) Survey date:  10/3 EVAL  Any of your usual work, household or school activities 0  2. Your usual hobbies, recreational/sport activities 0   3. Lifting a bag of groceries to waist level 0   4. Lifting a bag of groceries above your head 0  5. Grooming your hair 0  6. Pushing up on your hands (I.e. from bathtub or chair) 0  7. Preparing food (I.e. peeling/cutting) 0  8. Driving  0  9. Vacuuming, sweeping, or raking 0  10. Dressing  1  11. Doing up buttons 3  12. Using tools/appliances 0  13. Opening doors 0  14. Cleaning  0  15. Tying or lacing shoes 1  16. Sleeping  2  17. Laundering clothes (I.e. washing, ironing, folding) 0  18. Opening a jar 1  19. Throwing a ball 0  20. Carrying a small suitcase with your affected limb.  0  Score total:  8/80     COGNITIVE STATUS: Within functional limits for tasks assessed   SENSATION: WFL  EDEMA:  No  POSTURE:  Eval: Rounded shoulder in sling as expected post op  HAND DOMINANCE:  Right   Body Part #1 Shoulder  PALPATION: EVAL: no s/s of infection  UPPER EXTREMITY ROM:  Passive ROM Right eval   Shoulder flexion 50 in pendulum   Shoulder extension  Shoulder abduction    Shoulder adduction    Shoulder extension    Shoulder internal rotation    Shoulder external rotation     Elbow flexion WFL   Elbow extension -10   Wrist  WFL    (Blank rows = not tested)   TREATMENT DATE:   10/9 Pendulum Bent over row Bent over triceps kick Standing against wall- scap retraction, + iso GHJ ext, + triceps ext Pulleys- chair turned to face pulley Bolster roll for flexion, seated- achieved 80 deg shoulder flexion- discussed this being a good one to do on dialysis days.  Discussed ice and techniques at home  EVAL Changed bandages- no s/s of infection See HEP   PATIENT EDUCATION:  Education details: Anatomy of condition, POC, HEP, exercise form/rationale Person educated: Patient Education method: Explanation, Demonstration, Tactile cues, Verbal cues, and Handouts Education comprehension: verbalized understanding, returned demonstration, verbal cues required, tactile cues required, and needs further education   HOME EXERCISE PROGRAM: Access Code: [email or web address] URL: [email or web address] Date: 04/28/2024 Prepared by: Harlene Cordon    ASSESSMENT:  CLINICAL IMPRESSION: Improved tolerance to motion but is very weak in scapular retraction control resulting in forward rounded shoulder and impingement with flexion.     PERSONAL FACTORS: Patient is currently on dialysis 3 days/week are also affecting patient's functional outcome.   REHAB POTENTIAL: Good  CLINICAL DECISION MAKING: Stable/uncomplicated  EVALUATION COMPLEXITY: Low   GOALS: Goals reviewed with patient? Yes  SHORT TERM GOALS: Target date: 05/13/24  Patient will demonstrate full active range of motion of elbow without discomfort and without assistance Baseline: Goal status: INITIAL  2.  Demonstrate full passive range of motion flexion as well as active assisted range of motion on pulleys without increase in pain Baseline:  Goal status: INITIAL  3.  Pain to stay below 5 out of 10 with daily activities Baseline:  Goal status: INITIAL  4.  Independent in self-care as mom will  be leaving Baseline:  Goal status: INITIAL    LONG TERM GOALS: Target date: POC date  Full active range of motion of shoulder without compensatory patterns and without impingement Baseline:  Goal status: INITIAL  2.  UEFS to improve by MDC x 3 Baseline:  Goal status: INITIAL  3.  Gross shoulder strength to 85% of the opposite upper extremity Baseline:  Goal status: INITIAL  4.  Independent and long-term home exercise program for continued strength and stability Baseline:  Goal status: INITIAL    PLAN:  PT FREQUENCY: 1-2x/week  PT DURATION: Plan of care date  PLANNED INTERVENTIONS: 97164- PT Re-evaluation, 97750- Physical Performance Testing, 97110-Therapeutic exercises, 97530- Therapeutic activity, [id number]- Neuromuscular re-education, 97535- Self Care, 02859- Manual therapy, [phone]- Aquatic Therapy, [phone] (1-2 muscles), 20561 (3+ muscles)- Dry Needling, Patient/Family education, Taping, Joint mobilization, Spinal mobilization, Scar mobilization, and Cryotherapy.  PLAN FOR NEXT SESSION: Progression as tolerated considering labral repair and biceps tenodesis   Harlene Cordon, PT, DPT 05/04/2024, 2:25 PM

## 2024-05-05 ENCOUNTER — Encounter (HOSPITAL_BASED_OUTPATIENT_CLINIC_OR_DEPARTMENT_OTHER): Admitting: Physical Therapy

## 2024-05-11 ENCOUNTER — Ambulatory Visit (HOSPITAL_BASED_OUTPATIENT_CLINIC_OR_DEPARTMENT_OTHER): Admitting: Physical Therapy

## 2024-05-11 ENCOUNTER — Encounter (HOSPITAL_BASED_OUTPATIENT_CLINIC_OR_DEPARTMENT_OTHER): Payer: Self-pay | Admitting: Physical Therapy

## 2024-05-11 DIAGNOSIS — M25511 Pain in right shoulder: Secondary | ICD-10-CM | POA: Diagnosis not present

## 2024-05-11 DIAGNOSIS — M6281 Muscle weakness (generalized): Secondary | ICD-10-CM

## 2024-05-11 DIAGNOSIS — M25611 Stiffness of right shoulder, not elsewhere classified: Secondary | ICD-10-CM

## 2024-05-11 NOTE — Therapy (Signed)
 OUTPATIENT PHYSICAL THERAPY EVALUATION   Patient Name: Cindy Luna MRN: [id number] DOB:[birth year], 43 y.o., female Today's Date: 05/11/2024  END OF SESSION:  PT End of Session - 05/11/24 1349     Visit Number 3    Number of Visits 20    Date for Recertification  07/22/24    Authorization Type [employer] Dual Complete    Progress Note Due on Visit 10    PT Start Time 1348    PT Stop Time 1421    PT Time Calculation (min) 33 min    Activity Tolerance Patient tolerated treatment well    Behavior During Therapy [facility] for tasks assessed/performed           Past Medical History:  Diagnosis Date   Allergy    Anemia    Anxiety    Depression    DUB (dysfunctional uterine bleeding) 06/24/2016   End stage renal disease (HCC)    Dialysis Mon-Wed-Fri   Fatty liver    GERD (gastroesophageal reflux disease)    HLD (hyperlipidemia)    HTN (hypertension) 2007   with pregnancy but resolved after delivery   Lupus    Osteoarthritis    shoulders   Ovarian cyst 09/01/2016   Peripheral vascular disease    Pneumonia    x 1   Renal disorder    Sleep apnea    Past Surgical History:  Procedure Laterality Date   AV FISTULA PLACEMENT  2016 right, 2009 left   left and right are both working    BICEPT TENODESIS Right 04/25/2024   Procedure: TENODESIS, BICEPS;  Surgeon: Genelle Standing, MD;  Location: [facility] OR;  Service: Orthopedics;  Laterality: Right;   CESAREAN SECTION     x 1   COLONOSCOPY     OTHER SURGICAL HISTORY Left    AV fistula stents, arm   POSTERIOR LUMBAR FUSION 2 WITH HARDWARE REMOVAL Right 04/25/2024   Procedure: ARTHROSCOPY, SHOULDER WITH DEBRIDEMENT;  Surgeon: Genelle Standing, MD;  Location: [facility] OR;  Service: Orthopedics;  Laterality: Right;  RIGHT SHOULDER ARTHROSCOPY WITH DISTAL CLAVICLE RESECTION, ACROMIOPLASTY, BICEPS TENODESIS   SHOULDER INJECTION Left 04/25/2024   Procedure: INJECTION, SHOULDER;  Surgeon: Genelle Standing, MD;  Location: [facility] OR;  Service: Orthopedics;   Laterality: Left;  LEFT GLENOHUMERAL INJECTION   TUBAL LIGATION  2010   UPPER GI ENDOSCOPY     Patient Active Problem List   Diagnosis Date Noted   Biceps tendinitis of right upper extremity 04/25/2024   Arthrosis of right acromioclavicular joint 04/25/2024   Tendinitis of right rotator cuff 04/25/2024   Rotator cuff tendonitis, left 04/25/2024   Unilateral primary osteoarthritis, right hip 01/27/2024   DM (diabetes mellitus) (HCC)    Chronic constipation 11/16/2019   Eczema 11/16/2019   Environmental and seasonal allergies 07/17/2019   History of depression 07/17/2019   Patient is Jehovah's Witness 07/13/2019   Chest tightness 01/04/2019   Dysmenorrhea 05/03/2018   Class 3 severe obesity with serious comorbidity and body mass index (BMI) of 45.0 to 49.9 in adult (HCC) 04/18/2018   Lymphadenopathy of head and neck 09/29/2017   OSA (obstructive sleep apnea) 12/10/2014   ESRD (end stage renal disease) on dialysis (HCC) 11/19/2014   Lupus (HCC) 11/19/2014     REFERRING PROVIDER: Standing LITTIE Genelle MD   REFERRING DIAG:  M75.81 (ICD-10-CM) - Tendinitis of right rotator cuff     Arthroscopic extensive debridement - 29823 Subdeltoid Bursa, Supraspinatus Tendon, Anterior Labrum, and Superior Labrum Arthroscopic distal clavicle excision - 70175  Arthroscopic subacromial decompression - 70173 Arthroscopic biceps tenodesis - 70171 Rationale for Evaluation and Treatment: Rehabilitation  THERAPY DIAG:  Acute pain of right shoulder  Stiffness of right shoulder, not elsewhere classified  Muscle weakness (generalized)  ONSET DATE: DOS 04/25/24   SUBJECTIVE:                                                                                                                                                                                           SUBJECTIVE STATEMENT: Not sure what is going on with my body but I am getting full body cramps which has limited me doing my exercises  PERTINENT  HISTORY:  dialysis  PAIN:  Are you having pain? Yes: NPRS scale: 3 at rest Pain location: Rt shoulder Pain description: sore Aggravating factors: movement Relieving factors: ice  PRECAUTIONS:  Dialysis- shoulder has to be still for 4 hours 3 days/week   RED FLAGS: None   WEIGHT BEARING RESTRICTIONS:  No  FALLS:  Has patient fallen in last 6 months? No   OCCUPATION:  Not working  PLOF:  Independent  PATIENT GOALS:  Have my mobility back  OBJECTIVE:  Note: Objective measures were completed at Evaluation unless otherwise noted.   PATIENT SURVEYS:  UEFS  Extreme difficulty/unable (0), Quite a bit of difficulty (1), Moderate difficulty (2), Little difficulty (3), No difficulty (4) Survey date:  10/3 EVAL   Any of your usual work, household or school activities 0   2. Your usual hobbies, recreational/sport activities 0    3. Lifting a bag of groceries to waist level 0    4. Lifting a bag of groceries above your head 0   5. Grooming your hair 0   6. Pushing up on your hands (I.e. from bathtub or chair) 0   7. Preparing food (I.e. peeling/cutting) 0   8. Driving  0   9. Vacuuming, sweeping, or raking 0   10. Dressing  1   11. Doing up buttons 3   12. Using tools/appliances 0   13. Opening doors 0   14. Cleaning  0   15. Tying or lacing shoes 1   16. Sleeping  2   17. Laundering clothes (I.e. washing, ironing, folding) 0   18. Opening a jar 1   19. Throwing a ball 0   20. Carrying a small suitcase with your affected limb.  0   Score total:  8/80      POSTURE:  Eval: Rounded shoulder in sling as expected post op  HAND DOMINANCE:  Right   Body Part #1 Shoulder  PALPATION: EVAL: no s/s of infection  UPPER  EXTREMITY ROM:  Passive ROM Right eval   Shoulder flexion 50 in pendulum   Shoulder extension    Shoulder abduction    Shoulder adduction    Shoulder extension    Shoulder internal rotation    Shoulder external rotation    Elbow flexion WFL    Elbow extension -10   Wrist  WFL    (Blank rows = not tested)   TREATMENT DATE:   1/16 Walking with arm swing Seated active row with hands on thighs Walk back L from bar Bent over row Bent over triceps Chicken wings  10/9 Pendulum Bent over row Bent over triceps kick Standing against wall- scap retraction, + iso GHJ ext, + triceps ext Pulleys- chair turned to face pulley Bolster roll for flexion, seated- achieved 80 deg shoulder flexion- discussed this being a good one to do on dialysis days.  Discussed ice and techniques at home  EVAL Changed bandages- no s/s of infection See HEP   PATIENT EDUCATION:  Education details: Anatomy of condition, POC, HEP, exercise form/rationale Person educated: Patient Education method: Explanation, Demonstration, Tactile cues, Verbal cues, and Handouts Education comprehension: verbalized understanding, returned demonstration, verbal cues required, tactile cues required, and needs further education   HOME EXERCISE PROGRAM: Access Code: [email or web address] URL: [email or web address] Date: 04/28/2024 Prepared by: Harlene Cordon    ASSESSMENT:  CLINICAL IMPRESSION: Excellent flexibility present wihtout impingement. Gradually progressing as tolerated considering full-body cramps that have begun.     PERSONAL FACTORS: Patient is currently on dialysis 3 days/week are also affecting patient's functional outcome.   REHAB POTENTIAL: Good  CLINICAL DECISION MAKING: Stable/uncomplicated  EVALUATION COMPLEXITY: Low   GOALS: Goals reviewed with patient? Yes  SHORT TERM GOALS: Target date: 05/13/24  Patient will demonstrate full active range of motion of elbow without discomfort and without assistance Baseline: Goal status: MET  2.  Demonstrate full passive range of motion flexion as well as active assisted range of motion on pulleys without increase in pain Baseline:  Goal status: MET  3.  Pain to stay below 5 out of  10 with daily activities Baseline:  Goal status: MET  4.  Independent in self-care as mom will be leaving Baseline:  Goal status: met- can perform self care but has 34 yo daughter who is autistic and requires care    LONG TERM GOALS: Target date: POC date  Full active range of motion of shoulder without compensatory patterns and without impingement Baseline:  Goal status: INITIAL  2.  UEFS to improve by MDC x 3 Baseline:  Goal status: INITIAL  3.  Gross shoulder strength to 85% of the opposite upper extremity Baseline:  Goal status: INITIAL  4.  Independent and long-term home exercise program for continued strength and stability Baseline:  Goal status: INITIAL    PLAN:  PT FREQUENCY: 1-2x/week  PT DURATION: Plan of care date  PLANNED INTERVENTIONS: 97164- PT Re-evaluation, 97750- Physical Performance Testing, 97110-Therapeutic exercises, 97530- Therapeutic activity, [id number]- Neuromuscular re-education, 97535- Self Care, 02859- Manual therapy, [phone]- Aquatic Therapy, [phone] (1-2 muscles), [phone] (3+ muscles)- Dry Needling, Patient/Family education, Taping, Joint mobilization, Spinal mobilization, Scar mobilization, and Cryotherapy.  PLAN FOR NEXT SESSION: Progression as tolerated considering labral repair and biceps tenodesis   Harlene Cordon, PT, DPT 05/11/2024, 2:22 PM

## 2024-05-12 ENCOUNTER — Encounter (HOSPITAL_BASED_OUTPATIENT_CLINIC_OR_DEPARTMENT_OTHER): Admitting: Physical Therapy

## 2024-05-18 ENCOUNTER — Ambulatory Visit (HOSPITAL_BASED_OUTPATIENT_CLINIC_OR_DEPARTMENT_OTHER): Admitting: Physical Therapy

## 2024-05-18 ENCOUNTER — Encounter (HOSPITAL_BASED_OUTPATIENT_CLINIC_OR_DEPARTMENT_OTHER): Payer: Self-pay | Admitting: Physical Therapy

## 2024-05-18 DIAGNOSIS — M25611 Stiffness of right shoulder, not elsewhere classified: Secondary | ICD-10-CM

## 2024-05-18 DIAGNOSIS — M25511 Pain in right shoulder: Secondary | ICD-10-CM | POA: Diagnosis not present

## 2024-05-18 DIAGNOSIS — M6281 Muscle weakness (generalized): Secondary | ICD-10-CM

## 2024-05-18 NOTE — Therapy (Signed)
 OUTPATIENT PHYSICAL THERAPY EVALUATION   Patient Name: Cindy Luna MRN: [id number] DOB:05/26/1981, 43 y.o., female Today's Date: 05/18/2024  END OF SESSION:  PT End of Session - 05/18/24 1301     Visit Number 4    Number of Visits 20    Date for Recertification  07/22/24    Authorization Type [employer] Dual Complete    Progress Note Due on Visit 10    PT Start Time 1301    PT Stop Time 1342    PT Time Calculation (min) 41 min    Activity Tolerance Patient tolerated treatment well    Behavior During Therapy [facility] for tasks assessed/performed            Past Medical History:  Diagnosis Date   Allergy    Anemia    Anxiety    Depression    DUB (dysfunctional uterine bleeding) 06/24/2016   End stage renal disease (HCC)    Dialysis Mon-Wed-Fri   Fatty liver    GERD (gastroesophageal reflux disease)    HLD (hyperlipidemia)    HTN (hypertension) 2007   with pregnancy but resolved after delivery   Lupus    Osteoarthritis    shoulders   Ovarian cyst 09/01/2016   Peripheral vascular disease    Pneumonia    x 1   Renal disorder    Sleep apnea    Past Surgical History:  Procedure Laterality Date   AV FISTULA PLACEMENT  2016 right, 2009 left   left and right are both working    BICEPT TENODESIS Right 04/25/2024   Procedure: TENODESIS, BICEPS;  Surgeon: Genelle Standing, MD;  Location: [facility] OR;  Service: Orthopedics;  Laterality: Right;   CESAREAN SECTION     x 1   COLONOSCOPY     OTHER SURGICAL HISTORY Left    AV fistula stents, arm   POSTERIOR LUMBAR FUSION 2 WITH HARDWARE REMOVAL Right 04/25/2024   Procedure: ARTHROSCOPY, SHOULDER WITH DEBRIDEMENT;  Surgeon: Genelle Standing, MD;  Location: [facility] OR;  Service: Orthopedics;  Laterality: Right;  RIGHT SHOULDER ARTHROSCOPY WITH DISTAL CLAVICLE RESECTION, ACROMIOPLASTY, BICEPS TENODESIS   SHOULDER INJECTION Left 04/25/2024   Procedure: INJECTION, SHOULDER;  Surgeon: Genelle Standing, MD;  Location: [facility] OR;  Service: Orthopedics;   Laterality: Left;  LEFT GLENOHUMERAL INJECTION   TUBAL LIGATION  2010   UPPER GI ENDOSCOPY     Patient Active Problem List   Diagnosis Date Noted   Biceps tendinitis of right upper extremity 04/25/2024   Arthrosis of right acromioclavicular joint 04/25/2024   Tendinitis of right rotator cuff 04/25/2024   Rotator cuff tendonitis, left 04/25/2024   Unilateral primary osteoarthritis, right hip 01/27/2024   DM (diabetes mellitus) (HCC)    Chronic constipation 11/16/2019   Eczema 11/16/2019   Environmental and seasonal allergies 07/17/2019   History of depression 07/17/2019   Patient is Jehovah's Witness 07/13/2019   Chest tightness 01/04/2019   Dysmenorrhea 05/03/2018   Class 3 severe obesity with serious comorbidity and body mass index (BMI) of 45.0 to 49.9 in adult (HCC) 04/18/2018   Lymphadenopathy of head and neck 09/29/2017   OSA (obstructive sleep apnea) 12/10/2014   ESRD (end stage renal disease) on dialysis (HCC) 11/19/2014   Lupus (HCC) 11/19/2014     REFERRING PROVIDER: Standing LITTIE Genelle MD   REFERRING DIAG:  M75.81 (ICD-10-CM) - Tendinitis of right rotator cuff     Arthroscopic extensive debridement - 29823 Subdeltoid Bursa, Supraspinatus Tendon, Anterior Labrum, and Superior Labrum Arthroscopic distal clavicle excision -  70175 Arthroscopic subacromial decompression - 29826 Arthroscopic biceps tenodesis - 70171 Rationale for Evaluation and Treatment: Rehabilitation  THERAPY DIAG:  Acute pain of right shoulder  Stiffness of right shoulder, not elsewhere classified  Muscle weakness (generalized)  ONSET DATE: DOS 04/25/24   SUBJECTIVE:                                                                                                                                                                                           SUBJECTIVE STATEMENT: Still having full body cramps. MD did change one of the meds but that was very recent so it needs time to take affect.    PERTINENT HISTORY:  dialysis  PAIN:  Are you having pain? Yes: NPRS scale: 3 at rest Pain location: Rt shoulder Pain description: sore Aggravating factors: movement Relieving factors: ice  PRECAUTIONS:  Dialysis- shoulder has to be still for 4 hours 3 days/week   RED FLAGS: None   WEIGHT BEARING RESTRICTIONS:  No  FALLS:  Has patient fallen in last 6 months? No   OCCUPATION:  Not working  PLOF:  Independent  PATIENT GOALS:  Have my mobility back  OBJECTIVE:  Note: Objective measures were completed at Evaluation unless otherwise noted.   PATIENT SURVEYS:  UEFS  Extreme difficulty/unable (0), Quite a bit of difficulty (1), Moderate difficulty (2), Little difficulty (3), No difficulty (4) Survey date:  10/3 EVAL   Any of your usual work, household or school activities 0   2. Your usual hobbies, recreational/sport activities 0    3. Lifting a bag of groceries to waist level 0    4. Lifting a bag of groceries above your head 0   5. Grooming your hair 0   6. Pushing up on your hands (I.e. from bathtub or chair) 0   7. Preparing food (I.e. peeling/cutting) 0   8. Driving  0   9. Vacuuming, sweeping, or raking 0   10. Dressing  1   11. Doing up buttons 3   12. Using tools/appliances 0   13. Opening doors 0   14. Cleaning  0   15. Tying or lacing shoes 1   16. Sleeping  2   17. Laundering clothes (I.e. washing, ironing, folding) 0   18. Opening a jar 1   19. Throwing a ball 0   20. Carrying a small suitcase with your affected limb.  0   Score total:  8/80      POSTURE:  Eval: Rounded shoulder in sling as expected post op  HAND DOMINANCE:  Right   Body Part #1 Shoulder  PALPATION: EVAL: no s/s of  infection  UPPER EXTREMITY ROM:  Passive ROM Right eval   Shoulder flexion 50 in pendulum   Shoulder extension    Shoulder abduction    Shoulder adduction    Shoulder extension    Shoulder internal rotation    Shoulder external rotation    Elbow  flexion WFL   Elbow extension -10   Wrist  WFL    (Blank rows = not tested)   TREATMENT DATE:   05/18/24 Wall ladder - able to achieve 136 deg flexion Seated hand slide up elevated table angle Seated ER yellow tband Seated row holding horiz abd tension on yellow band Standing row & triceps red tband Bent over row & horiz abd  1/16 Walking with arm swing Seated active row with hands on thighs Walk back L from bar Bent over row Bent over triceps Chicken wings  10/9 Pendulum Bent over row Bent over triceps kick Standing against wall- scap retraction, + iso GHJ ext, + triceps ext Pulleys- chair turned to face pulley Bolster roll for flexion, seated- achieved 80 deg shoulder flexion- discussed this being a good one to do on dialysis days.  Discussed ice and techniques at home   PATIENT EDUCATION:  Education details: Anatomy of condition, POC, HEP, exercise form/rationale Person educated: Patient Education method: Explanation, Demonstration, Tactile cues, Verbal cues, and Handouts Education comprehension: verbalized understanding, returned demonstration, verbal cues required, tactile cues required, and needs further education   HOME EXERCISE PROGRAM: Access Code: [email or web address] URL: [email or web address] Date: 04/28/2024 Prepared by: Harlene Cordon    ASSESSMENT:  CLINICAL IMPRESSION: Use of full body motion to avoid cramping vs isolated shoulder exercises/stretching. AAROM improving and was able to tolerate table walk until it was 20 deg shy of perpendicular.     PERSONAL FACTORS: Patient is currently on dialysis 3 days/week are also affecting patient's functional outcome.   REHAB POTENTIAL: Good  CLINICAL DECISION MAKING: Stable/uncomplicated  EVALUATION COMPLEXITY: Low   GOALS: Goals reviewed with patient? Yes  SHORT TERM GOALS: Target date: 05/13/24  Patient will demonstrate full active range of motion of elbow without discomfort and  without assistance Baseline: Goal status: MET  2.  Demonstrate full passive range of motion flexion as well as active assisted range of motion on pulleys without increase in pain Baseline:  Goal status: MET  3.  Pain to stay below 5 out of 10 with daily activities Baseline:  Goal status: MET  4.  Independent in self-care as mom will be leaving Baseline:  Goal status: met- can perform self care but has 38 yo daughter who is autistic and requires care    LONG TERM GOALS: Target date: POC date  Full active range of motion of shoulder without compensatory patterns and without impingement Baseline:  Goal status: INITIAL  2.  UEFS to improve by MDC x 3 Baseline:  Goal status: INITIAL  3.  Gross shoulder strength to 85% of the opposite upper extremity Baseline:  Goal status: INITIAL  4.  Independent and long-term home exercise program for continued strength and stability Baseline:  Goal status: INITIAL    PLAN:  PT FREQUENCY: 1-2x/week  PT DURATION: Plan of care date  PLANNED INTERVENTIONS: 97164- PT Re-evaluation, 97750- Physical Performance Testing, 97110-Therapeutic exercises, 97530- Therapeutic activity, [id number]- Neuromuscular re-education, 97535- Self Care, 02859- Manual therapy, [phone]- Aquatic Therapy, [phone] (1-2 muscles), 20561 (3+ muscles)- Dry Needling, Patient/Family education, Taping, Joint mobilization, Spinal mobilization, Scar mobilization, and Cryotherapy.  PLAN FOR NEXT SESSION: Progression as tolerated considering labral repair and  biceps tenodesis   Harlene Cordon, PT, DPT 05/18/2024, 1:43 PM

## 2024-05-19 ENCOUNTER — Encounter (HOSPITAL_BASED_OUTPATIENT_CLINIC_OR_DEPARTMENT_OTHER): Admitting: Physical Therapy

## 2024-05-23 ENCOUNTER — Other Ambulatory Visit: Payer: Self-pay

## 2024-05-23 ENCOUNTER — Encounter (HOSPITAL_BASED_OUTPATIENT_CLINIC_OR_DEPARTMENT_OTHER): Payer: Self-pay | Admitting: Physical Therapy

## 2024-05-23 ENCOUNTER — Ambulatory Visit (HOSPITAL_BASED_OUTPATIENT_CLINIC_OR_DEPARTMENT_OTHER): Admitting: Physical Therapy

## 2024-05-23 DIAGNOSIS — E119 Type 2 diabetes mellitus without complications: Secondary | ICD-10-CM

## 2024-05-23 DIAGNOSIS — M25511 Pain in right shoulder: Secondary | ICD-10-CM | POA: Diagnosis not present

## 2024-05-23 DIAGNOSIS — M6281 Muscle weakness (generalized): Secondary | ICD-10-CM

## 2024-05-23 DIAGNOSIS — M25611 Stiffness of right shoulder, not elsewhere classified: Secondary | ICD-10-CM

## 2024-05-23 NOTE — Progress Notes (Signed)
   05/23/2024  Patient ID: [name] Cart, female   DOB: Aug 13, 1980, 43 y.o.   MRN: [id number]  Pharmacy Quality Measure Review  This patient is appearing on a report for being at risk of failing the Glycemic Status Assessment in Diabetes measure this calendar year.   Last documented A1c 5.4 on 02/24/24  A1C is controlled, no further action needed at this time  Jon VEAR Lindau, PharmD Clinical Pharmacist [phone]

## 2024-05-23 NOTE — Therapy (Signed)
 OUTPATIENT PHYSICAL THERAPY THERAPY    Patient Name: Cindy Luna MRN: [id number] DOB:1981-07-13, 43 y.o., female Today's Date: 05/23/2024  END OF SESSION:  PT End of Session - 05/23/24 1128     Visit Number 5    Number of Visits 20    Date for Recertification  07/22/24    Authorization Type [employer] Dual Complete    Progress Note Due on Visit 10    PT Start Time 1108   late pt arrival/check in   PT Stop Time 1146    PT Time Calculation (min) 38 min    Activity Tolerance Patient tolerated treatment well    Behavior During Therapy [facility] for tasks assessed/performed             Past Medical History:  Diagnosis Date   Allergy    Anemia    Anxiety    Depression    DUB (dysfunctional uterine bleeding) 06/24/2016   End stage renal disease (HCC)    Dialysis Mon-Wed-Fri   Fatty liver    GERD (gastroesophageal reflux disease)    HLD (hyperlipidemia)    HTN (hypertension) 2007   with pregnancy but resolved after delivery   Lupus    Osteoarthritis    shoulders   Ovarian cyst 09/01/2016   Peripheral vascular disease    Pneumonia    x 1   Renal disorder    Sleep apnea    Past Surgical History:  Procedure Laterality Date   AV FISTULA PLACEMENT  2016 right, 2009 left   left and right are both working    BICEPT TENODESIS Right 04/25/2024   Procedure: TENODESIS, BICEPS;  Surgeon: Genelle Standing, MD;  Location: [facility] OR;  Service: Orthopedics;  Laterality: Right;   CESAREAN SECTION     x 1   COLONOSCOPY     OTHER SURGICAL HISTORY Left    AV fistula stents, arm   POSTERIOR LUMBAR FUSION 2 WITH HARDWARE REMOVAL Right 04/25/2024   Procedure: ARTHROSCOPY, SHOULDER WITH DEBRIDEMENT;  Surgeon: Genelle Standing, MD;  Location: [facility] OR;  Service: Orthopedics;  Laterality: Right;  RIGHT SHOULDER ARTHROSCOPY WITH DISTAL CLAVICLE RESECTION, ACROMIOPLASTY, BICEPS TENODESIS   SHOULDER INJECTION Left 04/25/2024   Procedure: INJECTION, SHOULDER;  Surgeon: Genelle Standing, MD;  Location: [facility] OR;   Service: Orthopedics;  Laterality: Left;  LEFT GLENOHUMERAL INJECTION   TUBAL LIGATION  2010   UPPER GI ENDOSCOPY     Patient Active Problem List   Diagnosis Date Noted   Biceps tendinitis of right upper extremity 04/25/2024   Arthrosis of right acromioclavicular joint 04/25/2024   Tendinitis of right rotator cuff 04/25/2024   Rotator cuff tendonitis, left 04/25/2024   Unilateral primary osteoarthritis, right hip 01/27/2024   DM (diabetes mellitus) (HCC)    Chronic constipation 11/16/2019   Eczema 11/16/2019   Environmental and seasonal allergies 07/17/2019   History of depression 07/17/2019   Patient is Jehovah's Witness 07/13/2019   Chest tightness 01/04/2019   Dysmenorrhea 05/03/2018   Class 3 severe obesity with serious comorbidity and body mass index (BMI) of 45.0 to 49.9 in adult (HCC) 04/18/2018   Lymphadenopathy of head and neck 09/29/2017   OSA (obstructive sleep apnea) 12/10/2014   ESRD (end stage renal disease) on dialysis (HCC) 11/19/2014   Lupus (HCC) 11/19/2014     REFERRING PROVIDER: Standing LITTIE Genelle MD   REFERRING DIAG:  M75.81 (ICD-10-CM) - Tendinitis of right rotator cuff     Arthroscopic extensive debridement - 29823 Subdeltoid Bursa, Supraspinatus Tendon, Anterior Labrum, and  Superior Labrum Arthroscopic distal clavicle excision - 70175 Arthroscopic subacromial decompression - 70173 Arthroscopic biceps tenodesis - 70171 Rationale for Evaluation and Treatment: Rehabilitation  THERAPY DIAG:  Acute pain of right shoulder  Stiffness of right shoulder, not elsewhere classified  Muscle weakness (generalized)  ONSET DATE: DOS 04/25/24   SUBJECTIVE:                                                                                                                                                                                           SUBJECTIVE STATEMENT:  Was at HD yesterday and had a bad cramp in my arm, shoulder is feeling tight today   PERTINENT  HISTORY:  dialysis  PAIN:  Are you having pain? No discomfort 0/10 pain   PRECAUTIONS:  Dialysis- shoulder has to be still for 4 hours 3 days/week   RED FLAGS: None   WEIGHT BEARING RESTRICTIONS:  No  FALLS:  Has patient fallen in last 6 months? No   OCCUPATION:  Not working  PLOF:  Independent  PATIENT GOALS:  Have my mobility back  OBJECTIVE:  Note: Objective measures were completed at Evaluation unless otherwise noted.   PATIENT SURVEYS:  UEFS  Extreme difficulty/unable (0), Quite a bit of difficulty (1), Moderate difficulty (2), Little difficulty (3), No difficulty (4) Survey date:  10/3 EVAL   Any of your usual work, household or school activities 0   2. Your usual hobbies, recreational/sport activities 0    3. Lifting a bag of groceries to waist level 0    4. Lifting a bag of groceries above your head 0   5. Grooming your hair 0   6. Pushing up on your hands (I.e. from bathtub or chair) 0   7. Preparing food (I.e. peeling/cutting) 0   8. Driving  0   9. Vacuuming, sweeping, or raking 0   10. Dressing  1   11. Doing up buttons 3   12. Using tools/appliances 0   13. Opening doors 0   14. Cleaning  0   15. Tying or lacing shoes 1   16. Sleeping  2   17. Laundering clothes (I.e. washing, ironing, folding) 0   18. Opening a jar 1   19. Throwing a ball 0   20. Carrying a small suitcase with your affected limb.  0   Score total:  8/80      POSTURE:  Eval: Rounded shoulder in sling as expected post op  HAND DOMINANCE:  Right   Body Part #1 Shoulder  PALPATION: EVAL: no s/s of infection  UPPER EXTREMITY ROM:  Passive ROM Right eval   Shoulder  flexion 50 in pendulum   Shoulder extension    Shoulder abduction    Shoulder adduction    Shoulder extension    Shoulder internal rotation    Shoulder external rotation    Elbow flexion WFL   Elbow extension -10   Wrist  WFL    (Blank rows = not tested)   TREATMENT DATE:     05/23/24  Shoulder stretching and PROM to tolerance Supine shoulder flexion AAROM x6 L walk back from counter x10  Wall ladder x10- best 142*  Gentle isometrics 25% effort: flexion x10, ABD x10, extension x10    Education on scar massage and gross shoulder desensitization, avoiding UE extension past neutral with surgical UE for now (has been doing this bathing)       05/18/24 Wall ladder - able to achieve 136 deg flexion Seated hand slide up elevated table angle Seated ER yellow tband Seated row holding horiz abd tension on yellow band Standing row & triceps red tband Bent over row & horiz abd  1/16 Walking with arm swing Seated active row with hands on thighs Walk back L from bar Bent over row Bent over triceps Chicken wings  10/9 Pendulum Bent over row Bent over triceps kick Standing against wall- scap retraction, + iso GHJ ext, + triceps ext Pulleys- chair turned to face pulley Bolster roll for flexion, seated- achieved 80 deg shoulder flexion- discussed this being a good one to do on dialysis days.  Discussed ice and techniques at home   PATIENT EDUCATION:  Education details: Anatomy of condition, POC, HEP, exercise form/rationale Person educated: Patient Education method: Explanation, Demonstration, Tactile cues, Verbal cues, and Handouts Education comprehension: verbalized understanding, returned demonstration, verbal cues required, tactile cues required, and needs further education   HOME EXERCISE PROGRAM: Access Code: [email or web address] URL: [email or web address] Date: 04/28/2024 Prepared by: Harlene Cordon    ASSESSMENT:  CLINICAL IMPRESSION:  Had less cramping today but had more tightness in R shoulder, less tolerance to activity today in general. Took our time with all exercises, also discussed scar massage and desensitization techniques to try to help with general tissue sensitivity.     PERSONAL FACTORS: Patient is currently  on dialysis 3 days/week are also affecting patient's functional outcome.   REHAB POTENTIAL: Good  CLINICAL DECISION MAKING: Stable/uncomplicated  EVALUATION COMPLEXITY: Low   GOALS: Goals reviewed with patient? Yes  SHORT TERM GOALS: Target date: 05/13/24  Patient will demonstrate full active range of motion of elbow without discomfort and without assistance Baseline: Goal status: MET  2.  Demonstrate full passive range of motion flexion as well as active assisted range of motion on pulleys without increase in pain Baseline:  Goal status: MET  3.  Pain to stay below 5 out of 10 with daily activities Baseline:  Goal status: MET  4.  Independent in self-care as mom will be leaving Baseline:  Goal status: met- can perform self care but has 12 yo daughter who is autistic and requires care    LONG TERM GOALS: Target date: POC date  Full active range of motion of shoulder without compensatory patterns and without impingement Baseline:  Goal status: INITIAL  2.  UEFS to improve by MDC x 3 Baseline:  Goal status: INITIAL  3.  Gross shoulder strength to 85% of the opposite upper extremity Baseline:  Goal status: INITIAL  4.  Independent and long-term home exercise program for continued strength and stability Baseline:  Goal status: INITIAL    PLAN:  PT  FREQUENCY: 1-2x/week  PT DURATION: Plan of care date  PLANNED INTERVENTIONS: 97164- PT Re-evaluation, 97750- Physical Performance Testing, 97110-Therapeutic exercises, 97530- Therapeutic activity, 97112- Neuromuscular re-education, 97535- Self Care, 02859- Manual therapy, [phone]- Aquatic Therapy, [phone] (1-2 muscles), 20561 (3+ muscles)- Dry Needling, Patient/Family education, Taping, Joint mobilization, Spinal mobilization, Scar mobilization, and Cryotherapy.  PLAN FOR NEXT SESSION: Progression as tolerated considering labral repair and biceps tenodesis. 4 weeks out as of 05/23/24   Josette Rough, PT, DPT 05/23/24  11:46 AM

## 2024-05-25 ENCOUNTER — Encounter (HOSPITAL_BASED_OUTPATIENT_CLINIC_OR_DEPARTMENT_OTHER): Payer: Self-pay | Admitting: Physical Therapy

## 2024-05-25 ENCOUNTER — Ambulatory Visit (HOSPITAL_BASED_OUTPATIENT_CLINIC_OR_DEPARTMENT_OTHER): Admitting: Physical Therapy

## 2024-05-25 DIAGNOSIS — M6281 Muscle weakness (generalized): Secondary | ICD-10-CM

## 2024-05-25 DIAGNOSIS — M25511 Pain in right shoulder: Secondary | ICD-10-CM

## 2024-05-25 DIAGNOSIS — M25611 Stiffness of right shoulder, not elsewhere classified: Secondary | ICD-10-CM

## 2024-05-25 NOTE — Therapy (Signed)
 OUTPATIENT PHYSICAL THERAPY THERAPY    Patient Name: Cindy Luna MRN: [id number] DOB:1981/02/16, 43 y.o., female Today's Date: 05/25/2024  END OF SESSION:  PT End of Session - 05/25/24 1355     Visit Number 6    Number of Visits 20    Date for Recertification  07/22/24    Authorization Type [employer] Dual Complete    Progress Note Due on Visit 10    PT Start Time 1354    PT Stop Time 1433    PT Time Calculation (min) 39 min    Activity Tolerance Patient tolerated treatment well    Behavior During Therapy [facility] for tasks assessed/performed             Past Medical History:  Diagnosis Date   Allergy    Anemia    Anxiety    Depression    DUB (dysfunctional uterine bleeding) 06/24/2016   End stage renal disease (HCC)    Dialysis Mon-Wed-Fri   Fatty liver    GERD (gastroesophageal reflux disease)    HLD (hyperlipidemia)    HTN (hypertension) 2007   with pregnancy but resolved after delivery   Lupus    Osteoarthritis    shoulders   Ovarian cyst 09/01/2016   Peripheral vascular disease    Pneumonia    x 1   Renal disorder    Sleep apnea    Past Surgical History:  Procedure Laterality Date   AV FISTULA PLACEMENT  2016 right, 2009 left   left and right are both working    BICEPT TENODESIS Right 04/25/2024   Procedure: TENODESIS, BICEPS;  Surgeon: Genelle Standing, MD;  Location: [facility] OR;  Service: Orthopedics;  Laterality: Right;   CESAREAN SECTION     x 1   COLONOSCOPY     OTHER SURGICAL HISTORY Left    AV fistula stents, arm   POSTERIOR LUMBAR FUSION 2 WITH HARDWARE REMOVAL Right 04/25/2024   Procedure: ARTHROSCOPY, SHOULDER WITH DEBRIDEMENT;  Surgeon: Genelle Standing, MD;  Location: [facility] OR;  Service: Orthopedics;  Laterality: Right;  RIGHT SHOULDER ARTHROSCOPY WITH DISTAL CLAVICLE RESECTION, ACROMIOPLASTY, BICEPS TENODESIS   SHOULDER INJECTION Left 04/25/2024   Procedure: INJECTION, SHOULDER;  Surgeon: Genelle Standing, MD;  Location: [facility] OR;  Service: Orthopedics;   Laterality: Left;  LEFT GLENOHUMERAL INJECTION   TUBAL LIGATION  2010   UPPER GI ENDOSCOPY     Patient Active Problem List   Diagnosis Date Noted   Biceps tendinitis of right upper extremity 04/25/2024   Arthrosis of right acromioclavicular joint 04/25/2024   Tendinitis of right rotator cuff 04/25/2024   Rotator cuff tendonitis, left 04/25/2024   Unilateral primary osteoarthritis, right hip 01/27/2024   DM (diabetes mellitus) (HCC)    Chronic constipation 11/16/2019   Eczema 11/16/2019   Environmental and seasonal allergies 07/17/2019   History of depression 07/17/2019   Patient is Jehovah's Witness 07/13/2019   Chest tightness 01/04/2019   Dysmenorrhea 05/03/2018   Class 3 severe obesity with serious comorbidity and body mass index (BMI) of 45.0 to 49.9 in adult (HCC) 04/18/2018   Lymphadenopathy of head and neck 09/29/2017   OSA (obstructive sleep apnea) 12/10/2014   ESRD (end stage renal disease) on dialysis (HCC) 11/19/2014   Lupus (HCC) 11/19/2014     REFERRING PROVIDER: Standing LITTIE Genelle MD   REFERRING DIAG:  M75.81 (ICD-10-CM) - Tendinitis of right rotator cuff     Arthroscopic extensive debridement - 29823 Subdeltoid Bursa, Supraspinatus Tendon, Anterior Labrum, and Superior Labrum Arthroscopic distal clavicle  excision - 70175 Arthroscopic subacromial decompression - 70173 Arthroscopic biceps tenodesis - 70171 Rationale for Evaluation and Treatment: Rehabilitation  THERAPY DIAG:  Acute pain of right shoulder  Stiffness of right shoulder, not elsewhere classified  Muscle weakness (generalized)  ONSET DATE: DOS 04/25/24   SUBJECTIVE:                                                                                                                                                                                           SUBJECTIVE STATEMENT:  My whole right side cramped at dialysis on Wed- new meds to start tomorrow.   PERTINENT HISTORY:  dialysis  PAIN:   Are you having pain? No and Yes: NPRS scale: mild Pain location: anterior right shoulder Pain description: tight Aggravating factors: spasm Relieving factors: rest   PRECAUTIONS:  Dialysis- shoulder has to be still for 4 hours 3 days/week   RED FLAGS: None   WEIGHT BEARING RESTRICTIONS:  No  FALLS:  Has patient fallen in last 6 months? No   OCCUPATION:  Not working  PLOF:  Independent  PATIENT GOALS:  Have my mobility back  OBJECTIVE:  Note: Objective measures were completed at Evaluation unless otherwise noted.   PATIENT SURVEYS:  UEFS  Extreme difficulty/unable (0), Quite a bit of difficulty (1), Moderate difficulty (2), Little difficulty (3), No difficulty (4) Survey date:  10/3 EVAL   Any of your usual work, household or school activities 0   2. Your usual hobbies, recreational/sport activities 0    3. Lifting a bag of groceries to waist level 0    4. Lifting a bag of groceries above your head 0   5. Grooming your hair 0   6. Pushing up on your hands (I.e. from bathtub or chair) 0   7. Preparing food (I.e. peeling/cutting) 0   8. Driving  0   9. Vacuuming, sweeping, or raking 0   10. Dressing  1   11. Doing up buttons 3   12. Using tools/appliances 0   13. Opening doors 0   14. Cleaning  0   15. Tying or lacing shoes 1   16. Sleeping  2   17. Laundering clothes (I.e. washing, ironing, folding) 0   18. Opening a jar 1   19. Throwing a ball 0   20. Carrying a small suitcase with your affected limb.  0   Score total:  8/80      POSTURE:  Eval: Rounded shoulder in sling as expected post op  HAND DOMINANCE:  Right   Body Part #1 Shoulder  PALPATION: EVAL: no s/s of infection  UPPER EXTREMITY ROM:  Passive ROM Right eval   Shoulder flexion 50 in pendulum   Shoulder extension    Shoulder abduction    Shoulder adduction    Shoulder extension    Shoulder internal rotation    Shoulder external rotation    Elbow flexion WFL   Elbow  extension -10   Wrist  WFL    (Blank rows = not tested)   TREATMENT DATE:    10/30 Discussion of functional use of UE and limitations AAROM abd with PT assist & cane, eccentric lower; also done in scaption Row- progressed from red to green Anchored ER red tband Finger ladder- + assisted liftoff Isometric ext for pec stretch   05/23/24  Shoulder stretching and PROM to tolerance Supine shoulder flexion AAROM x6 L walk back from counter x10  Wall ladder x10- best 142*  Gentle isometrics 25% effort: flexion x10, ABD x10, extension x10    Education on scar massage and gross shoulder desensitization, avoiding UE extension past neutral with surgical UE for now (has been doing this bathing)     05/18/24 Wall ladder - able to achieve 136 deg flexion Seated hand slide up elevated table angle Seated ER yellow tband Seated row holding horiz abd tension on yellow band Standing row & triceps red tband Bent over row & horiz abd     PATIENT EDUCATION:  Education details: Teacher, Music of condition, POC, HEP, exercise form/rationale Person educated: Patient Education method: Explanation, Demonstration, Tactile cues, Verbal cues, and Handouts Education comprehension: verbalized understanding, returned demonstration, verbal cues required, tactile cues required, and needs further education   HOME EXERCISE PROGRAM: Access Code: [email or web address] URL: [email or web address] Date: 04/28/2024 Prepared by: Harlene Cordon    ASSESSMENT:  CLINICAL IMPRESSION:  Utilize contraction through isometric extension and scapular retraction for pectoralis stretch rather than reaching behind.  Encouraged patient to be mindful of stressing the repair when extending behind the back.  Excellent improvement in shoulder range of motion when climbing the finger ladder and is able to demonstrate controlled contraction for lift off but was not successful and independently lifting her arm from the  wall.    PERSONAL FACTORS: Patient is currently on dialysis 3 days/week are also affecting patient's functional outcome.   REHAB POTENTIAL: Good  CLINICAL DECISION MAKING: Stable/uncomplicated  EVALUATION COMPLEXITY: Low   GOALS: Goals reviewed with patient? Yes  SHORT TERM GOALS: Target date: 05/13/24  Patient will demonstrate full active range of motion of elbow without discomfort and without assistance Baseline: Goal status: MET  2.  Demonstrate full passive range of motion flexion as well as active assisted range of motion on pulleys without increase in pain Baseline:  Goal status: MET  3.  Pain to stay below 5 out of 10 with daily activities Baseline:  Goal status: MET  4.  Independent in self-care as mom will be leaving Baseline:  Goal status: met- can perform self care but has 21 yo daughter who is autistic and requires care    LONG TERM GOALS: Target date: POC date  Full active range of motion of shoulder without compensatory patterns and without impingement Baseline:  Goal status: INITIAL  2.  UEFS to improve by MDC x 3 Baseline:  Goal status: INITIAL  3.  Gross shoulder strength to 85% of the opposite upper extremity Baseline:  Goal status: INITIAL  4.  Independent and long-term home exercise program for continued strength and stability Baseline:  Goal status: INITIAL    PLAN:  PT FREQUENCY: 1-2x/week  PT DURATION: Plan  of care date  PLANNED INTERVENTIONS: 97164- PT Re-evaluation, 97750- Physical Performance Testing, 97110-Therapeutic exercises, 97530- Therapeutic activity, [id number]- Neuromuscular re-education, 97535- Self Care, 97140- Manual therapy, [phone]- Aquatic Therapy, [phone] (1-2 muscles), 20561 (3+ muscles)- Dry Needling, Patient/Family education, Taping, Joint mobilization, Spinal mobilization, Scar mobilization, and Cryotherapy.  PLAN FOR NEXT SESSION: Progression as tolerated considering labral repair and biceps tenodesis. 4 weeks out  as of 05/23/24   Wajiha Versteeg C. Myrah Strawderman PT, DPT 05/25/24 8:17 PM

## 2024-05-26 ENCOUNTER — Encounter (HOSPITAL_BASED_OUTPATIENT_CLINIC_OR_DEPARTMENT_OTHER): Admitting: Physical Therapy

## 2024-05-29 ENCOUNTER — Encounter: Payer: Self-pay | Admitting: Radiology

## 2024-05-29 NOTE — Therapy (Signed)
 OUTPATIENT PHYSICAL THERAPY THERAPY    Patient Name: Cindy Luna MRN: [id number] DOB:14-Jun-1981, 43 y.o., female Today's Date: 05/30/2024  END OF SESSION:  PT End of Session - 05/30/24 1109     Visit Number 7    Number of Visits 20    Date for Recertification  07/22/24    Authorization Type [employer] Dual Complete    Progress Note Due on Visit 10    PT Start Time 1108    PT Stop Time 1146    PT Time Calculation (min) 38 min    Activity Tolerance Patient tolerated treatment well    Behavior During Therapy [facility] for tasks assessed/performed              Past Medical History:  Diagnosis Date   Allergy    Anemia    Anxiety    Depression    DUB (dysfunctional uterine bleeding) 06/24/2016   End stage renal disease (HCC)    Dialysis Mon-Wed-Fri   Fatty liver    GERD (gastroesophageal reflux disease)    HLD (hyperlipidemia)    HTN (hypertension) 2007   with pregnancy but resolved after delivery   Lupus    Osteoarthritis    shoulders   Ovarian cyst 09/01/2016   Peripheral vascular disease    Pneumonia    x 1   Renal disorder    Sleep apnea    Past Surgical History:  Procedure Laterality Date   AV FISTULA PLACEMENT  2016 right, 2009 left   left and right are both working    BICEPT TENODESIS Right 04/25/2024   Procedure: TENODESIS, BICEPS;  Surgeon: Genelle Standing, MD;  Location: [facility] OR;  Service: Orthopedics;  Laterality: Right;   CESAREAN SECTION     x 1   COLONOSCOPY     OTHER SURGICAL HISTORY Left    AV fistula stents, arm   POSTERIOR LUMBAR FUSION 2 WITH HARDWARE REMOVAL Right 04/25/2024   Procedure: ARTHROSCOPY, SHOULDER WITH DEBRIDEMENT;  Surgeon: Genelle Standing, MD;  Location: [facility] OR;  Service: Orthopedics;  Laterality: Right;  RIGHT SHOULDER ARTHROSCOPY WITH DISTAL CLAVICLE RESECTION, ACROMIOPLASTY, BICEPS TENODESIS   SHOULDER INJECTION Left 04/25/2024   Procedure: INJECTION, SHOULDER;  Surgeon: Genelle Standing, MD;  Location: [facility] OR;  Service: Orthopedics;   Laterality: Left;  LEFT GLENOHUMERAL INJECTION   TUBAL LIGATION  2010   UPPER GI ENDOSCOPY     Patient Active Problem List   Diagnosis Date Noted   Biceps tendinitis of right upper extremity 04/25/2024   Arthrosis of right acromioclavicular joint 04/25/2024   Tendinitis of right rotator cuff 04/25/2024   Rotator cuff tendonitis, left 04/25/2024   Unilateral primary osteoarthritis, right hip 01/27/2024   DM (diabetes mellitus) (HCC)    Chronic constipation 11/16/2019   Eczema 11/16/2019   Environmental and seasonal allergies 07/17/2019   History of depression 07/17/2019   Patient is Jehovah's Witness 07/13/2019   Chest tightness 01/04/2019   Dysmenorrhea 05/03/2018   Class 3 severe obesity with serious comorbidity and body mass index (BMI) of 45.0 to 49.9 in adult (HCC) 04/18/2018   Lymphadenopathy of head and neck 09/29/2017   OSA (obstructive sleep apnea) 12/10/2014   ESRD (end stage renal disease) on dialysis (HCC) 11/19/2014   Lupus (HCC) 11/19/2014     REFERRING PROVIDER: Standing LITTIE Genelle MD   REFERRING DIAG:  M75.81 (ICD-10-CM) - Tendinitis of right rotator cuff     Arthroscopic extensive debridement - 29823 Subdeltoid Bursa, Supraspinatus Tendon, Anterior Labrum, and Superior Labrum Arthroscopic distal  clavicle excision - 70175 Arthroscopic subacromial decompression - 70173 Arthroscopic biceps tenodesis - 70171 Rationale for Evaluation and Treatment: Rehabilitation  THERAPY DIAG:  Acute pain of right shoulder  Stiffness of right shoulder, not elsewhere classified  Muscle weakness (generalized)  ONSET DATE: DOS 04/25/24   SUBJECTIVE:                                                                                                                                                                                           SUBJECTIVE STATEMENT:  Meds are still not helping with cramping- only had 2 doses. My muscles are always sore.   PERTINENT HISTORY:   dialysis  PAIN:  Are you having pain? No and Yes: NPRS scale: mild Pain location: anterior right shoulder Pain description: tight Aggravating factors: spasm Relieving factors: rest   PRECAUTIONS:  Dialysis- shoulder has to be still for 4 hours 3 days/week   RED FLAGS: None   WEIGHT BEARING RESTRICTIONS:  No  FALLS:  Has patient fallen in last 6 months? No   OCCUPATION:  Not working  PLOF:  Independent  PATIENT GOALS:  Have my mobility back  OBJECTIVE:  Note: Objective measures were completed at Evaluation unless otherwise noted.   PATIENT SURVEYS:  UEFS  Extreme difficulty/unable (0), Quite a bit of difficulty (1), Moderate difficulty (2), Little difficulty (3), No difficulty (4) Survey date:  10/3 EVAL 11/4  Any of your usual work, household or school activities 0 1  2. Your usual hobbies, recreational/sport activities 0 1   3. Lifting a bag of groceries to waist level 0 1   4. Lifting a bag of groceries above your head 0 0  5. Grooming your hair 0 2  6. Pushing up on your hands (I.e. from bathtub or chair) 0 1  7. Preparing food (I.e. peeling/cutting) 0 2  8. Driving  0 1  9. Vacuuming, sweeping, or raking 0 1  10. Dressing  1 1  11. Doing up buttons 3 4  12. Using tools/appliances 0 1  13. Opening doors 0 1  14. Cleaning  0 1  15. Tying or lacing shoes 1 4  16. Sleeping  2 2  17. Laundering clothes (I.e. washing, ironing, folding) 0 2  18. Opening a jar 1 1  19. Throwing a ball 0 1  20. Carrying a small suitcase with your affected limb.  0 0  Score total:  8/80 28/80     POSTURE:  Eval: Rounded shoulder in sling as expected post op  HAND DOMINANCE:  Right   Body Part #1 Shoulder  PALPATION: EVAL: no s/s of infection  UPPER EXTREMITY ROM:  Passive ROM Right eval A/AAROM 11/4 Right  Shoulder flexion 50 in pendulum 65/ 140 on finger ladder  Shoulder extension    Shoulder abduction  50  Shoulder adduction    Shoulder extension     Shoulder internal rotation    Shoulder external rotation    Elbow flexion WFL   Elbow extension -10 0  Wrist  WFL    (Blank rows = not tested)   TREATMENT DATE:   05/30/24 Finger ladder Supine shoulder flexion with wand- pausing at 90 & end range  Repeated with mid range ROM Sidelying GHJ ER- also with pause at 0 rotation Sidelying GHJ ER to 0 + flexion Sidelying abd, horiz abd   10/30 Discussion of functional use of UE and limitations AAROM abd with PT assist & cane, eccentric lower; also done in scaption Row- progressed from red to green Anchored ER red tband Finger ladder- + assisted liftoff Isometric ext for pec stretch   05/23/24  Shoulder stretching and PROM to tolerance Supine shoulder flexion AAROM x6 L walk back from counter x10  Wall ladder x10- best 142*  Gentle isometrics 25% effort: flexion x10, ABD x10, extension x10    Education on scar massage and gross shoulder desensitization, avoiding UE extension past neutral with surgical UE for now (has been doing this bathing)     05/18/24 Wall ladder - able to achieve 136 deg flexion Seated hand slide up elevated table angle Seated ER yellow tband Seated row holding horiz abd tension on yellow band Standing row & triceps red tband Bent over row & horiz abd     PATIENT EDUCATION:  Education details: Teacher, Music of condition, POC, HEP, exercise form/rationale Person educated: Patient Education method: Explanation, Demonstration, Tactile cues, Verbal cues, and Handouts Education comprehension: verbalized understanding, returned demonstration, verbal cues required, tactile cues required, and needs further education   HOME EXERCISE PROGRAM: Access Code: [email or web address] URL: [email or web address] Date: 04/28/2024 Prepared by: Harlene Cordon    ASSESSMENT:  CLINICAL IMPRESSION:  Use of sidelying shoulder exercises for strengthening and coordinated movement of scapula- excellent tolerance.  AROM continues to improve, flexibility available is great.     PERSONAL FACTORS: Patient is currently on dialysis 3 days/week are also affecting patient's functional outcome.   REHAB POTENTIAL: Good  CLINICAL DECISION MAKING: Stable/uncomplicated  EVALUATION COMPLEXITY: Low   GOALS: Goals reviewed with patient? Yes  SHORT TERM GOALS: Target date: 05/13/24  Patient will demonstrate full active range of motion of elbow without discomfort and without assistance Baseline: Goal status: MET  2.  Demonstrate full passive range of motion flexion as well as active assisted range of motion on pulleys without increase in pain Baseline:  Goal status: MET  3.  Pain to stay below 5 out of 10 with daily activities Baseline:  Goal status: MET  4.  Independent in self-care as mom will be leaving Baseline:  Goal status: met- can perform self care but has 50 yo daughter who is autistic and requires care    LONG TERM GOALS: Target date: POC date  Full active range of motion of shoulder without compensatory patterns and without impingement Baseline:  Goal status: INITIAL  2.  UEFS to improve by MDC x 3 Baseline:  Goal status: INITIAL  3.  Gross shoulder strength to 85% of the opposite upper extremity Baseline:  Goal status: INITIAL  4.  Independent and long-term home exercise program for continued strength and stability Baseline:  Goal status: INITIAL  PLAN:  PT FREQUENCY: 1-2x/week  PT DURATION: Plan of care date  PLANNED INTERVENTIONS: 97164- PT Re-evaluation, 97750- Physical Performance Testing, 97110-Therapeutic exercises, 97530- Therapeutic activity, 97112- Neuromuscular re-education, 97535- Self Care, 97140- Manual therapy, [phone]- Aquatic Therapy, [phone] (1-2 muscles), 20561 (3+ muscles)- Dry Needling, Patient/Family education, Taping, Joint mobilization, Spinal mobilization, Scar mobilization, and Cryotherapy.  PLAN FOR NEXT SESSION: Progression as tolerated  considering labral repair and biceps tenodesis. 4 weeks out as of 05/23/24   Alyssa Mancera C. Linas Stepter PT, DPT 05/30/24 11:47 AM

## 2024-05-30 ENCOUNTER — Ambulatory Visit (HOSPITAL_BASED_OUTPATIENT_CLINIC_OR_DEPARTMENT_OTHER): Attending: Orthopaedic Surgery | Admitting: Physical Therapy

## 2024-05-30 ENCOUNTER — Encounter (HOSPITAL_BASED_OUTPATIENT_CLINIC_OR_DEPARTMENT_OTHER): Payer: Self-pay | Admitting: Physical Therapy

## 2024-05-30 DIAGNOSIS — M25611 Stiffness of right shoulder, not elsewhere classified: Secondary | ICD-10-CM | POA: Diagnosis present

## 2024-05-30 DIAGNOSIS — M6281 Muscle weakness (generalized): Secondary | ICD-10-CM | POA: Diagnosis present

## 2024-05-30 DIAGNOSIS — M25511 Pain in right shoulder: Secondary | ICD-10-CM | POA: Diagnosis present

## 2024-06-01 ENCOUNTER — Encounter (HOSPITAL_BASED_OUTPATIENT_CLINIC_OR_DEPARTMENT_OTHER): Payer: Self-pay | Admitting: Physical Therapy

## 2024-06-01 ENCOUNTER — Ambulatory Visit (HOSPITAL_BASED_OUTPATIENT_CLINIC_OR_DEPARTMENT_OTHER): Admitting: Physical Therapy

## 2024-06-01 ENCOUNTER — Encounter (HOSPITAL_BASED_OUTPATIENT_CLINIC_OR_DEPARTMENT_OTHER): Admitting: Physical Therapy

## 2024-06-01 DIAGNOSIS — M6281 Muscle weakness (generalized): Secondary | ICD-10-CM

## 2024-06-01 DIAGNOSIS — M25611 Stiffness of right shoulder, not elsewhere classified: Secondary | ICD-10-CM

## 2024-06-01 DIAGNOSIS — M25511 Pain in right shoulder: Secondary | ICD-10-CM | POA: Diagnosis not present

## 2024-06-01 NOTE — Therapy (Signed)
 OUTPATIENT PHYSICAL THERAPY THERAPY    Patient Name: Cindy Luna MRN: [id number] DOB:April 15, 1981, 43 y.o., female Today's Date: 06/01/2024  END OF SESSION:  PT End of Session - 06/01/24 1101     Visit Number 8    Number of Visits 20    Date for Recertification  07/22/24    Authorization Type UHC Dual Complete    Progress Note Due on Visit 10    PT Start Time 1100    PT Stop Time 1140    PT Time Calculation (min) 40 min    Activity Tolerance Patient tolerated treatment well    Behavior During Therapy [facility] for tasks assessed/performed              Past Medical History:  Diagnosis Date   Allergy    Anemia    Anxiety    Depression    DUB (dysfunctional uterine bleeding) 06/24/2016   End stage renal disease (HCC)    Dialysis Mon-Wed-Fri   Fatty liver    GERD (gastroesophageal reflux disease)    HLD (hyperlipidemia)    HTN (hypertension) 2007   with pregnancy but resolved after delivery   Lupus    Osteoarthritis    shoulders   Ovarian cyst 09/01/2016   Peripheral vascular disease    Pneumonia    x 1   Renal disorder    Sleep apnea    Past Surgical History:  Procedure Laterality Date   AV FISTULA PLACEMENT  2016 right, 2009 left   left and right are both working    BICEPT TENODESIS Right 04/25/2024   Procedure: TENODESIS, BICEPS;  Surgeon: Genelle Standing, MD;  Location: [facility] OR;  Service: Orthopedics;  Laterality: Right;   CESAREAN SECTION     x 1   COLONOSCOPY     OTHER SURGICAL HISTORY Left    AV fistula stents, arm   POSTERIOR LUMBAR FUSION 2 WITH HARDWARE REMOVAL Right 04/25/2024   Procedure: ARTHROSCOPY, SHOULDER WITH DEBRIDEMENT;  Surgeon: Genelle Standing, MD;  Location: [facility] OR;  Service: Orthopedics;  Laterality: Right;  RIGHT SHOULDER ARTHROSCOPY WITH DISTAL CLAVICLE RESECTION, ACROMIOPLASTY, BICEPS TENODESIS   SHOULDER INJECTION Left 04/25/2024   Procedure: INJECTION, SHOULDER;  Surgeon: Genelle Standing, MD;  Location: [facility] OR;  Service: Orthopedics;   Laterality: Left;  LEFT GLENOHUMERAL INJECTION   TUBAL LIGATION  2010   UPPER GI ENDOSCOPY     Patient Active Problem List   Diagnosis Date Noted   Biceps tendinitis of right upper extremity 04/25/2024   Arthrosis of right acromioclavicular joint 04/25/2024   Tendinitis of right rotator cuff 04/25/2024   Rotator cuff tendonitis, left 04/25/2024   Unilateral primary osteoarthritis, right hip 01/27/2024   DM (diabetes mellitus) (HCC)    Chronic constipation 11/16/2019   Eczema 11/16/2019   Environmental and seasonal allergies 07/17/2019   History of depression 07/17/2019   Patient is Jehovah's Witness 07/13/2019   Chest tightness 01/04/2019   Dysmenorrhea 05/03/2018   Class 3 severe obesity with serious comorbidity and body mass index (BMI) of 45.0 to 49.9 in adult (HCC) 04/18/2018   Lymphadenopathy of head and neck 09/29/2017   OSA (obstructive sleep apnea) 12/10/2014   ESRD (end stage renal disease) on dialysis (HCC) 11/19/2014   Lupus (HCC) 11/19/2014     REFERRING PROVIDER: Standing LITTIE Genelle MD   REFERRING DIAG:  M75.81 (ICD-10-CM) - Tendinitis of right rotator cuff     Arthroscopic extensive debridement - 29823 Subdeltoid Bursa, Supraspinatus Tendon, Anterior Labrum, and Superior Labrum Arthroscopic distal  clavicle excision - 70175 Arthroscopic subacromial decompression - 70173 Arthroscopic biceps tenodesis - 70171 Rationale for Evaluation and Treatment: Rehabilitation  THERAPY DIAG:  Acute pain of right shoulder  Stiffness of right shoulder, not elsewhere classified  Muscle weakness (generalized)  ONSET DATE: DOS 04/25/24   SUBJECTIVE:                                                                                                                                                                                           SUBJECTIVE STATEMENT:  12-in-1 Melatonin medication purchased over the counter. I did get 1 slight cramp in foot but I did not have to jump out of  the bed at night. I can actually bend my elbow and reach overhead, still unable to elevate with straight arm.   PERTINENT HISTORY:  dialysis  PAIN:  Are you having pain? No and Yes: NPRS scale: mild Pain location: anterior right shoulder Pain description: tight Aggravating factors: spasm Relieving factors: rest   PRECAUTIONS:  Dialysis- shoulder has to be still for 4 hours 3 days/week   RED FLAGS: None   WEIGHT BEARING RESTRICTIONS:  No  FALLS:  Has patient fallen in last 6 months? No   OCCUPATION:  Not working  PLOF:  Independent  PATIENT GOALS:  Have my mobility back  OBJECTIVE:  Note: Objective measures were completed at Evaluation unless otherwise noted.   PATIENT SURVEYS:  UEFS  Extreme difficulty/unable (0), Quite a bit of difficulty (1), Moderate difficulty (2), Little difficulty (3), No difficulty (4) Survey date:  10/3 EVAL 11/4  Any of your usual work, household or school activities 0 1  2. Your usual hobbies, recreational/sport activities 0 1   3. Lifting a bag of groceries to waist level 0 1   4. Lifting a bag of groceries above your head 0 0  5. Grooming your hair 0 2  6. Pushing up on your hands (I.e. from bathtub or chair) 0 1  7. Preparing food (I.e. peeling/cutting) 0 2  8. Driving  0 1  9. Vacuuming, sweeping, or raking 0 1  10. Dressing  1 1  11. Doing up buttons 3 4  12. Using tools/appliances 0 1  13. Opening doors 0 1  14. Cleaning  0 1  15. Tying or lacing shoes 1 4  16. Sleeping  2 2  17. Laundering clothes (I.e. washing, ironing, folding) 0 2  18. Opening a jar 1 1  19. Throwing a ball 0 1  20. Carrying a small suitcase with your affected limb.  0 0  Score total:  8/80 28/80     POSTURE:  Eval: Rounded shoulder in sling as expected post op  HAND DOMINANCE:  Right   Body Part #1 Shoulder  PALPATION: EVAL: no s/s of infection  UPPER EXTREMITY ROM:  Passive ROM Right eval A/AAROM 11/4 Right  Shoulder flexion 50  in pendulum 65/ 140 on finger ladder  Shoulder extension    Shoulder abduction  50  Shoulder adduction    Shoulder extension    Shoulder internal rotation    Shoulder external rotation    Elbow flexion WFL   Elbow extension -10 0  Wrist  WFL    (Blank rows = not tested)   TREATMENT DATE:   06/01/24 UBE retro 4 min Finger ladder with liftoff- thumb stays in contact with wall today Supine chest press, + OH flexion with pause Supine horiz abd Supine ER Clavicular belly of SCM- stretching with cervical rotation & STM Supine AAROM shoulder flexion with inf mobs of proximal clavicle.  05/30/24 Finger ladder Supine shoulder flexion with wand- pausing at 90 & end range  Repeated with mid range ROM Sidelying GHJ ER- also with pause at 0 rotation Sidelying GHJ ER to 0 + flexion Sidelying abd, horiz abd   10/30 Discussion of functional use of UE and limitations AAROM abd with PT assist & cane, eccentric lower; also done in scaption Row- progressed from red to green Anchored ER red tband Finger ladder- + assisted liftoff Isometric ext for pec stretch      PATIENT EDUCATION:  Education details: Anatomy of condition, POC, HEP, exercise form/rationale Person educated: Patient Education method: Explanation, Demonstration, Tactile cues, Verbal cues, and Handouts Education comprehension: verbalized understanding, returned demonstration, verbal cues required, tactile cues required, and needs further education   HOME EXERCISE PROGRAM: Access Code: [email or web address] URL: [email or web address] Date: 04/28/2024 Prepared by: Harlene Cordon    ASSESSMENT:  CLINICAL IMPRESSION:  Notable elevation of Rt medial clavicle and lacking inferior glide with shoulder elevation. Demo decr AROM against gravity as expected following mobs but expect it to improve as she performs HEP.     PERSONAL FACTORS: Patient is currently on dialysis 3 days/week are also affecting patient's  functional outcome.   REHAB POTENTIAL: Good  CLINICAL DECISION MAKING: Stable/uncomplicated  EVALUATION COMPLEXITY: Low   GOALS: Goals reviewed with patient? Yes  SHORT TERM GOALS: Target date: 05/13/24  Patient will demonstrate full active range of motion of elbow without discomfort and without assistance Baseline: Goal status: MET  2.  Demonstrate full passive range of motion flexion as well as active assisted range of motion on pulleys without increase in pain Baseline:  Goal status: MET  3.  Pain to stay below 5 out of 10 with daily activities Baseline:  Goal status: MET  4.  Independent in self-care as mom will be leaving Baseline:  Goal status: met- can perform self care but has 30 yo daughter who is autistic and requires care    LONG TERM GOALS: Target date: POC date  Full active range of motion of shoulder without compensatory patterns and without impingement Baseline:  Goal status: INITIAL  2.  UEFS to improve by MDC x 3 Baseline:  Goal status: INITIAL  3.  Gross shoulder strength to 85% of the opposite upper extremity Baseline:  Goal status: INITIAL  4.  Independent and long-term home exercise program for continued strength and stability Baseline:  Goal status: INITIAL    PLAN:  PT FREQUENCY: 1-2x/week  PT DURATION: Plan of care date  PLANNED INTERVENTIONS: 97164- PT Re-evaluation, 97750- Physical Performance Testing,  97110-Therapeutic exercises, 97530- Therapeutic activity, [id number]- Neuromuscular re-education, [phone]- Self Care, 02859- Manual therapy, [phone]- Aquatic Therapy, [phone] (1-2 muscles), 20561 (3+ muscles)- Dry Needling, Patient/Family education, Taping, Joint mobilization, Spinal mobilization, Scar mobilization, and Cryotherapy.  PLAN FOR NEXT SESSION: continue with SCM and clavicular mobs PRN, AROM against gravity.    Audi Conover C. Viviane Semidey PT, DPT 06/01/24 11:45 AM

## 2024-06-02 ENCOUNTER — Encounter (HOSPITAL_BASED_OUTPATIENT_CLINIC_OR_DEPARTMENT_OTHER)

## 2024-06-08 ENCOUNTER — Encounter (HOSPITAL_BASED_OUTPATIENT_CLINIC_OR_DEPARTMENT_OTHER): Payer: Self-pay | Admitting: Physical Therapy

## 2024-06-08 ENCOUNTER — Ambulatory Visit (INDEPENDENT_AMBULATORY_CARE_PROVIDER_SITE_OTHER): Admitting: Orthopaedic Surgery

## 2024-06-08 ENCOUNTER — Ambulatory Visit (HOSPITAL_BASED_OUTPATIENT_CLINIC_OR_DEPARTMENT_OTHER): Admitting: Physical Therapy

## 2024-06-08 DIAGNOSIS — M6281 Muscle weakness (generalized): Secondary | ICD-10-CM

## 2024-06-08 DIAGNOSIS — M25611 Stiffness of right shoulder, not elsewhere classified: Secondary | ICD-10-CM

## 2024-06-08 DIAGNOSIS — M7581 Other shoulder lesions, right shoulder: Secondary | ICD-10-CM

## 2024-06-08 DIAGNOSIS — M25511 Pain in right shoulder: Secondary | ICD-10-CM | POA: Diagnosis not present

## 2024-06-08 NOTE — Therapy (Signed)
 OUTPATIENT PHYSICAL THERAPY THERAPY    Patient Name: Cindy Luna MRN: [id number] DOB:09-02-1980, 43 y.o., female Today's Date: 06/09/2024  END OF SESSION:  PT End of Session - 06/08/24 1411     Visit Number 9    Number of Visits 20    Date for Recertification  07/22/24    Authorization Type [employer] Dual Complete    PT Start Time 1407    PT Stop Time 1452    PT Time Calculation (min) 45 min    Activity Tolerance Patient tolerated treatment well    Behavior During Therapy [facility] for tasks assessed/performed               Past Medical History:  Diagnosis Date   Allergy    Anemia    Anxiety    Depression    DUB (dysfunctional uterine bleeding) 06/24/2016   End stage renal disease (HCC)    Dialysis Mon-Wed-Fri   Fatty liver    GERD (gastroesophageal reflux disease)    HLD (hyperlipidemia)    HTN (hypertension) 2007   with pregnancy but resolved after delivery   Lupus    Osteoarthritis    shoulders   Ovarian cyst 09/01/2016   Peripheral vascular disease    Pneumonia    x 1   Renal disorder    Sleep apnea    Past Surgical History:  Procedure Laterality Date   AV FISTULA PLACEMENT  2016 right, 2009 left   left and right are both working    BICEPT TENODESIS Right 04/25/2024   Procedure: TENODESIS, BICEPS;  Surgeon: Genelle Standing, MD;  Location: [facility] OR;  Service: Orthopedics;  Laterality: Right;   CESAREAN SECTION     x 1   COLONOSCOPY     OTHER SURGICAL HISTORY Left    AV fistula stents, arm   POSTERIOR LUMBAR FUSION 2 WITH HARDWARE REMOVAL Right 04/25/2024   Procedure: ARTHROSCOPY, SHOULDER WITH DEBRIDEMENT;  Surgeon: Genelle Standing, MD;  Location: [facility] OR;  Service: Orthopedics;  Laterality: Right;  RIGHT SHOULDER ARTHROSCOPY WITH DISTAL CLAVICLE RESECTION, ACROMIOPLASTY, BICEPS TENODESIS   SHOULDER INJECTION Left 04/25/2024   Procedure: INJECTION, SHOULDER;  Surgeon: Genelle Standing, MD;  Location: [facility] OR;  Service: Orthopedics;  Laterality: Left;  LEFT  GLENOHUMERAL INJECTION   TUBAL LIGATION  2010   UPPER GI ENDOSCOPY     Patient Active Problem List   Diagnosis Date Noted   Biceps tendinitis of right upper extremity 04/25/2024   Arthrosis of right acromioclavicular joint 04/25/2024   Tendinitis of right rotator cuff 04/25/2024   Rotator cuff tendonitis, left 04/25/2024   Unilateral primary osteoarthritis, right hip 01/27/2024   DM (diabetes mellitus) (HCC)    Chronic constipation 11/16/2019   Eczema 11/16/2019   Environmental and seasonal allergies 07/17/2019   History of depression 07/17/2019   Patient is Jehovah's Witness 07/13/2019   Chest tightness 01/04/2019   Dysmenorrhea 05/03/2018   Class 3 severe obesity with serious comorbidity and body mass index (BMI) of 45.0 to 49.9 in adult (HCC) 04/18/2018   Lymphadenopathy of head and neck 09/29/2017   OSA (obstructive sleep apnea) 12/10/2014   ESRD (end stage renal disease) on dialysis (HCC) 11/19/2014   Lupus (HCC) 11/19/2014     REFERRING PROVIDER: Standing LITTIE Genelle MD   REFERRING DIAG:  M75.81 (ICD-10-CM) - Tendinitis of right rotator cuff     Arthroscopic extensive debridement - 29823 Subdeltoid Bursa, Supraspinatus Tendon, Anterior Labrum, and Superior Labrum Arthroscopic distal clavicle excision - 70175 Arthroscopic subacromial decompression -  70173 Arthroscopic biceps tenodesis - [id number] Rationale for Evaluation and Treatment: Rehabilitation  THERAPY DIAG:  Acute pain of right shoulder  Stiffness of right shoulder, not elsewhere classified  Muscle weakness (generalized)  ONSET DATE: DOS 04/25/24   SUBJECTIVE:                                                                                                                                                                                           SUBJECTIVE STATEMENT:  Pt is 6 weeks and 2 days s/p arthroscopic biceps tenodesis, DCE, subacromial decompression, and extensive debridement.  Pt denies any adverse  effects after prior treatment.  Pt reports compliance with HEP.  Pt is limited with reaching upward.  Pt states she was able to sweep the other day.  PT instructed pt to not sweep at this time.       PERTINENT HISTORY:  Dialysis, end-stage renal disease lupus   PAIN:  Are you having pain? No and Yes: NPRS scale: 0/10 Pain location: anterior right shoulder Pain description: tight Aggravating factors: spasm Relieving factors: rest   PRECAUTIONS:  Dialysis- shoulder has to be still for 4 hours 3 days/week   RED FLAGS: None   WEIGHT BEARING RESTRICTIONS:  No  FALLS:  Has patient fallen in last 6 months? No   OCCUPATION:  Not working  PLOF:  Independent  PATIENT GOALS:  Have my mobility back  OBJECTIVE:  Note: Objective measures were completed at Evaluation unless otherwise noted.   PATIENT SURVEYS:  UEFS  Extreme difficulty/unable (0), Quite a bit of difficulty (1), Moderate difficulty (2), Little difficulty (3), No difficulty (4) Survey date:  10/3 EVAL 11/4  Any of your usual work, household or school activities 0 1  2. Your usual hobbies, recreational/sport activities 0 1   3. Lifting a bag of groceries to waist level 0 1   4. Lifting a bag of groceries above your head 0 0  5. Grooming your hair 0 2  6. Pushing up on your hands (I.e. from bathtub or chair) 0 1  7. Preparing food (I.e. peeling/cutting) 0 2  8. Driving  0 1  9. Vacuuming, sweeping, or raking 0 1  10. Dressing  1 1  11. Doing up buttons 3 4  12. Using tools/appliances 0 1  13. Opening doors 0 1  14. Cleaning  0 1  15. Tying or lacing shoes 1 4  16. Sleeping  2 2  17. Laundering clothes (I.e. washing, ironing, folding) 0 2  18. Opening a jar 1 1  19. Throwing a ball 0 1  20. Carrying a scientist, water quality with your  affected limb.  0 0  Score total:  8/80 28/80     POSTURE:  Eval: Rounded shoulder in sling as expected post op  HAND DOMINANCE:  Right   Body Part #1  Shoulder  PALPATION: EVAL: no s/s of infection  UPPER EXTREMITY ROM:  Passive ROM Right eval A/AAROM 11/4 Right  Shoulder flexion 50 in pendulum 65/ 140 on finger ladder  Shoulder extension    Shoulder abduction  50  Shoulder adduction    Shoulder extension    Shoulder internal rotation    Shoulder external rotation    Elbow flexion WFL   Elbow extension -10 0  Wrist  WFL    (Blank rows = not tested)   TREATMENT DATE:   06/08/24 Reviewed pt presentation, HEP compliance, pain level, and response to prior treatment. Pulleys in flexion and scaption Pt received R shoulder PROM in flexion, scaption, ER, and IR in supine Supine shoulder flexion AROM Supine serratus punch 2x10 Supine shoulder ABC x 1 rep S/L ER 2x10 Standing ladder walks x 10 Attempted wand flexion in a reclined position though pt unable to perform Bent over row  x 10   06/01/24 UBE retro 4 min Finger ladder with liftoff- thumb stays in contact with wall today Supine chest press, + OH flexion with pause Supine horiz abd Supine ER Clavicular belly of SCM- stretching with cervical rotation & STM Supine AAROM shoulder flexion with inf mobs of proximal clavicle.  05/30/24 Finger ladder Supine shoulder flexion with wand- pausing at 90 & end range  Repeated with mid range ROM Sidelying GHJ ER- also with pause at 0 rotation Sidelying GHJ ER to 0 + flexion Sidelying abd, horiz abd   10/30 Discussion of functional use of UE and limitations AAROM abd with PT assist & cane, eccentric lower; also done in scaption Row- progressed from red to green Anchored ER red tband Finger ladder- + assisted liftoff Isometric ext for pec stretch      PATIENT EDUCATION:  Education details: Anatomy of condition, POC, HEP, exercise form/rationale Person educated: Patient Education method: Explanation, Demonstration, Tactile cues, Verbal cues, and Handouts Education comprehension: verbalized understanding, returned  demonstration, verbal cues required, tactile cues required, and needs further education   HOME EXERCISE PROGRAM: Access Code: [email or web address] URL: [email or web address] Date: 04/28/2024 Prepared by: Harlene Cordon    ASSESSMENT:  CLINICAL IMPRESSION:  Pt is progressing well with shoulder ROM and tolerated PROM well.  PT attempted to increase gravity with wand flexion by having pt perform in a reclined position, but pt was unable to perform.  Pt is able to perform standing ladder walks in flexion.  PT provided instruction and cuing for correct form and positioning with exercises and she performed exercises per protocol well.  She responded well to treatment stating she had a smidge of discomfort, but doesn't hurt after treatment.  Pt should benefit from cont skilled PT per protocol to address impairments and goals and to improve assist in restoring desired level of function.    PERSONAL FACTORS: Patient is currently on dialysis 3 days/week are also affecting patient's functional outcome.   REHAB POTENTIAL: Good  CLINICAL DECISION MAKING: Stable/uncomplicated  EVALUATION COMPLEXITY: Low   GOALS: Goals reviewed with patient? Yes  SHORT TERM GOALS: Target date: 05/13/24  Patient will demonstrate full active range of motion of elbow without discomfort and without assistance Baseline: Goal status: MET  2.  Demonstrate full passive range of motion flexion as well as active assisted range of motion on  pulleys without increase in pain Baseline:  Goal status: MET  3.  Pain to stay below 5 out of 10 with daily activities Baseline:  Goal status: MET  4.  Independent in self-care as mom will be leaving Baseline:  Goal status: met- can perform self care but has 66 yo daughter who is autistic and requires care    LONG TERM GOALS: Target date: POC date  Full active range of motion of shoulder without compensatory patterns and without impingement Baseline:  Goal  status: INITIAL  2.  UEFS to improve by MDC x 3 Baseline:  Goal status: INITIAL  3.  Gross shoulder strength to 85% of the opposite upper extremity Baseline:  Goal status: INITIAL  4.  Independent and long-term home exercise program for continued strength and stability Baseline:  Goal status: INITIAL    PLAN:  PT FREQUENCY: 1-2x/week  PT DURATION: Plan of care date  PLANNED INTERVENTIONS: 97164- PT Re-evaluation, 97750- Physical Performance Testing, 97110-Therapeutic exercises, 97530- Therapeutic activity, [id number]- Neuromuscular re-education, 97535- Self Care, 02859- Manual therapy, [phone]- Aquatic Therapy, [phone] (1-2 muscles), 20561 (3+ muscles)- Dry Needling, Patient/Family education, Taping, Joint mobilization, Spinal mobilization, Scar mobilization, and Cryotherapy.  PLAN FOR NEXT SESSION: continue with SCM and clavicular mobs PRN, AROM against gravity.  Cont per protocol.   Leigh Minerva III PT, DPT 06/09/24 2:06 PM

## 2024-06-08 NOTE — Progress Notes (Signed)
 Post Operative Evaluation    Procedure/Date of Surgery: Right shoulder arthroscopy with biceps tenodesis 9/30  Interval History:   Presents 6 weeks status post the above procedure.  Overall she is doing extremely well.  Overhead range of motion is improving nicely   PMH/PSH/Family History/Social History/Meds/Allergies:    Past Medical History:  Diagnosis Date   Allergy    Anemia    Anxiety    Depression    DUB (dysfunctional uterine bleeding) 06/24/2016   End stage renal disease (HCC)    Dialysis Mon-Wed-Fri   Fatty liver    GERD (gastroesophageal reflux disease)    HLD (hyperlipidemia)    HTN (hypertension) 2007   with pregnancy but resolved after delivery   Lupus    Osteoarthritis    shoulders   Ovarian cyst 09/01/2016   Peripheral vascular disease    Pneumonia    x 1   Renal disorder    Sleep apnea    Past Surgical History:  Procedure Laterality Date   AV FISTULA PLACEMENT  2016 right, 2009 left   left and right are both working    BICEPT TENODESIS Right 04/25/2024   Procedure: TENODESIS, BICEPS;  Surgeon: Genelle Standing, MD;  Location: [facility] OR;  Service: Orthopedics;  Laterality: Right;   CESAREAN SECTION     x 1   COLONOSCOPY     OTHER SURGICAL HISTORY Left    AV fistula stents, arm   POSTERIOR LUMBAR FUSION 2 WITH HARDWARE REMOVAL Right 04/25/2024   Procedure: ARTHROSCOPY, SHOULDER WITH DEBRIDEMENT;  Surgeon: Genelle Standing, MD;  Location: [facility] OR;  Service: Orthopedics;  Laterality: Right;  RIGHT SHOULDER ARTHROSCOPY WITH DISTAL CLAVICLE RESECTION, ACROMIOPLASTY, BICEPS TENODESIS   SHOULDER INJECTION Left 04/25/2024   Procedure: INJECTION, SHOULDER;  Surgeon: Genelle Standing, MD;  Location: [facility] OR;  Service: Orthopedics;  Laterality: Left;  LEFT GLENOHUMERAL INJECTION   TUBAL LIGATION  2010   UPPER GI ENDOSCOPY     Social History   Socioeconomic History   Marital status: Single    Spouse name: Not on file   Number of  children: 1   Years of education: Not on file   Highest education level: Bachelor's degree (e.g., BA, AB, BS)  Occupational History   Not on file  Tobacco Use   Smoking status: Former    Types: Cigars    Start date: 2018    Quit date: 07/2018    Years since quitting: 5.8   Smokeless tobacco: Never  Vaping Use   Vaping status: Never Used  Substance and Sexual Activity   Alcohol use: No   Drug use: No   Sexual activity: Not Currently    Partners: Male    Birth control/protection: Surgical    Comment: tubal ligation  Other Topics Concern   Not on file  Social History Narrative   ** Merged History Encounter **       Level of education: college    Employment: unemployed    Transportation: car    Exercise: no   Housing situation: apt   Relationships (safe): yes   Contact for message (voicemail): [phone]   Social Drivers of Health   Financial Resource Strain: Low Risk  (01/18/2024)   Overall Financial Resource Strain (CARDIA)    Difficulty of Paying Living Expenses: Not very hard  Food Insecurity:  Low Risk  (04/20/2024)   Received from [facility]   Hunger Vital Sign    Within the past 12 months, you worried that your food would run out before you got money to buy more: Never true    Within the past 12 months, the food you bought just didn't last and you didn't have money to get more. : Never true  Transportation Needs: No Transportation Needs (04/20/2024)   [facility]   Transportation    In the past 12 months, has lack of reliable transportation kept you from medical appointments, meetings, work or from getting things needed for daily living? : No  Physical Activity: Inactive (01/18/2024)   Exercise Vital Sign    Days of Exercise per Week: 0 days    Minutes of Exercise per Session: Not on file  Stress: No Stress Concern Present (01/18/2024)   [employer] of Occupational Health - Occupational Stress Questionnaire    Feeling of Stress: Only a  little  Social Connections: Moderately Integrated (01/18/2024)   Social Connection and Isolation Panel    Frequency of Communication with Friends and Family: More than three times a week    Frequency of Social Gatherings with Friends and Family: More than three times a week    Attends Religious Services: More than 4 times per year    Active Member of [employer] or Organizations: Yes    Attends Engineer, Structural: More than 4 times per year    Marital Status: Never married   Family History  Problem Relation Age of Onset   Drug abuse Father    Early death Father    Hypertension Mother    Diabetes Mother    Autism Daughter    ADD / ADHD Daughter    Learning disabilities Daughter    Thyroid  disease Sister    Colon cancer Maternal Grandmother    Cancer Maternal Grandmother    Prostate cancer Maternal Grandfather    Cancer Maternal Grandfather    Stroke Maternal Grandfather    Breast cancer Maternal Aunt    Hypertension Maternal Aunt    Diabetes Other        both sides of the fam   Hypertension Other        both sides of the fam   Alcohol abuse Maternal Uncle    Cancer Maternal Aunt    Depression Maternal Aunt    Diabetes Maternal Aunt    Diabetes Paternal Uncle    Hypertension Paternal Uncle    Allergies  Allergen Reactions   Azithromycin Hives   Benadryl [Diphenhydramine Hcl] Hives   Clindamycin /Lincomycin Itching    Redness   Doxycycline Hyclate    Vancomycin Swelling   Adhesive [Tape] Rash   Current Outpatient Medications  Medication Sig Dispense Refill   aspirin  EC 325 MG tablet Take 1 tablet (325 mg total) by mouth daily. (Patient taking differently: Take 325 mg by mouth daily. To be taken after surgery to prevent blood clots per pt.) 14 tablet 0   cetirizine (ZYRTEC) 10 MG tablet Take 10 mg by mouth daily.     cinacalcet (SENSIPAR) 90 MG tablet Take 120 mg by mouth daily. Takes two 60 mg tabs daily     docusate sodium (COLACE) 100 MG capsule Take 300 mg by  mouth daily.     esomeprazole  (NEXIUM ) 40 MG capsule Take 40 mg by mouth at bedtime.     esomeprazole  (NEXIUM ) 40 MG capsule Take 1 capsule (40 mg total) by  mouth daily. (Patient taking differently: Take 40 mg by mouth at bedtime.) 90 capsule 3   famotidine  (PEPCID ) 20 MG tablet Take 1 tablet (20 mg total) by mouth daily as directed for heartburn. (Patient taking differently: Take 20 mg by mouth at bedtime.) 30 tablet 11   famotidine  (PEPCID ) 20 MG tablet Take 1 tablet (20 mg total) by mouth daily as directed for heartburn. (Patient taking differently: Take 20 mg by mouth at bedtime.) 90 tablet 3   linaclotide  (LINZESS ) 145 MCG CAPS capsule Take 1 capsule (145 mcg total) by mouth daily as needed. 30 capsule 5   linaclotide  (LINZESS ) 145 MCG CAPS capsule Take 1 capsule (145 mcg total) by mouth daily as needed. 90 capsule 2   norethindrone (AYGESTIN) 5 MG tablet Take 1 tablet by mouth daily.     oxyCODONE  (ROXICODONE ) 5 MG immediate release tablet Take 1 tablet (5 mg total) by mouth every 4 (four) hours as needed for severe pain (pain score 7-10) or breakthrough pain. 20 tablet 0   sevelamer carbonate (RENVELA) 800 MG tablet Take 800 mg by mouth. Take 5 tablets three times daily with meal and 2 tablets two times with snack     SUPER B COMPLEX/C PO      triamcinolone  cream (KENALOG ) 0.1 % Apply 1 application topically 2 (two) times daily. 30 g 0   Zinc 50 MG TABS      No current facility-administered medications for this visit.   No results found.  Review of Systems:   A ROS was performed including pertinent positives and negatives as documented in the HPI.   Musculoskeletal Exam:    There were no vitals taken for this visit.  Right shoulder incisions are well-appearing without erythema or drainage.  Active forward elevation is to approximately 30 degrees although passively is to 90 in the spine position with external rotation at side to 30 degrees distal neurosensory seems intact  Imaging:       I personally reviewed and interpreted the radiographs.   Assessment:   6 weeks status post right shoulder arthroscopy with biceps tenodesis doing well.  Overhead range of motion and strength are progressing nicely I will plan to see her back in 6 weeks for final check  Plan :    - Return to clinic 6 weeks for reassessment      I personally saw and evaluated the patient, and participated in the management and treatment plan.  Elspeth Parker, MD Attending Physician, Orthopedic Surgery  This document was dictated using Dragon voice recognition software. A reasonable attempt at proof reading has been made to minimize errors.

## 2024-06-13 ENCOUNTER — Encounter (HOSPITAL_BASED_OUTPATIENT_CLINIC_OR_DEPARTMENT_OTHER): Payer: Self-pay

## 2024-06-13 ENCOUNTER — Ambulatory Visit (HOSPITAL_BASED_OUTPATIENT_CLINIC_OR_DEPARTMENT_OTHER)

## 2024-06-13 DIAGNOSIS — M25611 Stiffness of right shoulder, not elsewhere classified: Secondary | ICD-10-CM

## 2024-06-13 DIAGNOSIS — M25511 Pain in right shoulder: Secondary | ICD-10-CM | POA: Diagnosis not present

## 2024-06-13 DIAGNOSIS — M6281 Muscle weakness (generalized): Secondary | ICD-10-CM

## 2024-06-13 NOTE — Therapy (Signed)
 OUTPATIENT PHYSICAL THERAPY THERAPY    Patient Name: Cindy Luna MRN: [id number] DOB:[birth year], 43 y.o., female Today's Date: 06/13/2024  END OF SESSION:  PT End of Session - 06/13/24 1149     Visit Number 10    Number of Visits 20    Date for Recertification  07/22/24    Authorization Type [employer] Dual Complete    Progress Note Due on Visit 10    PT Start Time 1151    PT Stop Time 1230    PT Time Calculation (min) 39 min    Activity Tolerance Patient tolerated treatment well    Behavior During Therapy [facility] for tasks assessed/performed                Past Medical History:  Diagnosis Date   Allergy    Anemia    Anxiety    Depression    DUB (dysfunctional uterine bleeding) 06/24/2016   End stage renal disease (HCC)    Dialysis Mon-Wed-Fri   Fatty liver    GERD (gastroesophageal reflux disease)    HLD (hyperlipidemia)    HTN (hypertension) 2007   with pregnancy but resolved after delivery   Lupus    Osteoarthritis    shoulders   Ovarian cyst 09/01/2016   Peripheral vascular disease    Pneumonia    x 1   Renal disorder    Sleep apnea    Past Surgical History:  Procedure Laterality Date   AV FISTULA PLACEMENT  2016 right, 2009 left   left and right are both working    BICEPT TENODESIS Right 04/25/2024   Procedure: TENODESIS, BICEPS;  Surgeon: Genelle Standing, MD;  Location: [facility] OR;  Service: Orthopedics;  Laterality: Right;   CESAREAN SECTION     x 1   COLONOSCOPY     OTHER SURGICAL HISTORY Left    AV fistula stents, arm   POSTERIOR LUMBAR FUSION 2 WITH HARDWARE REMOVAL Right 04/25/2024   Procedure: ARTHROSCOPY, SHOULDER WITH DEBRIDEMENT;  Surgeon: Genelle Standing, MD;  Location: [facility] OR;  Service: Orthopedics;  Laterality: Right;  RIGHT SHOULDER ARTHROSCOPY WITH DISTAL CLAVICLE RESECTION, ACROMIOPLASTY, BICEPS TENODESIS   SHOULDER INJECTION Left 04/25/2024   Procedure: INJECTION, SHOULDER;  Surgeon: Genelle Standing, MD;  Location: [facility] OR;  Service:  Orthopedics;  Laterality: Left;  LEFT GLENOHUMERAL INJECTION   TUBAL LIGATION  2010   UPPER GI ENDOSCOPY     Patient Active Problem List   Diagnosis Date Noted   Biceps tendinitis of right upper extremity 04/25/2024   Arthrosis of right acromioclavicular joint 04/25/2024   Tendinitis of right rotator cuff 04/25/2024   Rotator cuff tendonitis, left 04/25/2024   Unilateral primary osteoarthritis, right hip 01/27/2024   DM (diabetes mellitus) (HCC)    Chronic constipation 11/16/2019   Eczema 11/16/2019   Environmental and seasonal allergies 07/17/2019   History of depression 07/17/2019   Patient is Jehovah's Witness 07/13/2019   Chest tightness 01/04/2019   Dysmenorrhea 05/03/2018   Class 3 severe obesity with serious comorbidity and body mass index (BMI) of 45.0 to 49.9 in adult (HCC) 04/18/2018   Lymphadenopathy of head and neck 09/29/2017   OSA (obstructive sleep apnea) 12/10/2014   ESRD (end stage renal disease) on dialysis (HCC) 11/19/2014   Lupus (HCC) 11/19/2014     REFERRING PROVIDER: Standing LITTIE Genelle MD   REFERRING DIAG:  M75.81 (ICD-10-CM) - Tendinitis of right rotator cuff     Arthroscopic extensive debridement - 29823 Subdeltoid Bursa, Supraspinatus Tendon, Anterior Labrum, and Superior Labrum  Arthroscopic distal clavicle excision - 70175 Arthroscopic subacromial decompression - 70173 Arthroscopic biceps tenodesis - 70171 Rationale for Evaluation and Treatment: Rehabilitation  THERAPY DIAG:  Acute pain of right shoulder  Stiffness of right shoulder, not elsewhere classified  Muscle weakness (generalized)  ONSET DATE: DOS 04/25/24   SUBJECTIVE:                                                                                                                                                                                           SUBJECTIVE STATEMENT:  Pt reports increased pain in top of R shoulder for the last few days. Unsure what caused this. 4/10 pain  level with active movement.     PERTINENT HISTORY:  Dialysis, end-stage renal disease lupus   PAIN:  Are you having pain? No and Yes: NPRS scale: 4/10 Pain location: anterior right shoulder Pain description: tight Aggravating factors: spasm Relieving factors: rest   PRECAUTIONS:  Dialysis- shoulder has to be still for 4 hours 3 days/week   RED FLAGS: None   WEIGHT BEARING RESTRICTIONS:  No  FALLS:  Has patient fallen in last 6 months? No   OCCUPATION:  Not working  PLOF:  Independent  PATIENT GOALS:  Have my mobility back  OBJECTIVE:  Note: Objective measures were completed at Evaluation unless otherwise noted.   PATIENT SURVEYS:  UEFS  Extreme difficulty/unable (0), Quite a bit of difficulty (1), Moderate difficulty (2), Little difficulty (3), No difficulty (4) Survey date:  10/3 EVAL 11/4 11/18  Any of your usual work, household or school activities 0 1 1  2. Your usual hobbies, recreational/sport activities 0 1 3   3. Lifting a bag of groceries to waist level 0 1 1   4. Lifting a bag of groceries above your head 0 0 0  5. Grooming your hair 0 2 2  6. Pushing up on your hands (I.e. from bathtub or chair) 0 1 1  7. Preparing food (I.e. peeling/cutting) 0 2 1  8. Driving  0 1 1  9. Vacuuming, sweeping, or raking 0 1 2  10. Dressing  1 1 4   11. Doing up buttons 3 4 1   12. Using tools/appliances 0 1 1  13. Opening doors 0 1 4  14. Cleaning  0 1 1  15. Tying or lacing shoes 1 4 4   16. Sleeping  2 2 1   17. Laundering clothes (I.e. washing, ironing, folding) 0 2 3  18. Opening a jar 1 1 3   19. Throwing a ball 0 1 1  20. Carrying a small suitcase with your affected limb.  0 0 0  Score  total:  8/80 28/80 34/80     POSTURE:  Eval: Rounded shoulder in sling as expected post op  HAND DOMINANCE:  Right   Body Part #1 Shoulder  PALPATION: EVAL: no s/s of infection  UPPER EXTREMITY ROM:  Passive ROM Right eval A/AAROM 11/4 Right  R Passive 11/18  Shoulder flexion 50 in pendulum 65/ 140 on finger ladder 153  Shoulder extension     Shoulder abduction  50 143  Shoulder adduction     Shoulder extension     Shoulder internal rotation   51  Shoulder external rotation   75  Elbow flexion WFL    Elbow extension -10 0 -5  Wrist  WFL       (Blank rows = not tested)   UPPER EXTREMITY MMT:  MMT Right 11/18 Left 11/18  Shoulder flexion 3 4+  Shoulder extension    Shoulder abduction 3- 4-  Shoulder adduction    Shoulder extension    Shoulder internal rotation 4+ 5  Shoulder external rotation 4+ 5  Middle trapezius    Lower trapezius    Elbow flexion 4+ 4+  Elbow extension 4 4+  Wrist flexion    Wrist extension    Wrist ulnar deviation    Wrist radial deviation    Wrist pronation    Wrist supination    Grip strength     (Blank rows = not tested)     TREATMENT DATE:   06/13/24 Pulleys in flexion  (challenging today) x64min PROM R shoulder Updated ROM and strength Reviewed goals UEFI Supine wand flexion x10 STM to pectoral mm, UT, and SCM Wall ladder x4 (increased pain today, so stopped) Gentle pec stretch at wall (pain free only)    06/08/24 Reviewed pt presentation, HEP compliance, pain level, and response to prior treatment. Pulleys in flexion and scaption Pt received R shoulder PROM in flexion, scaption, ER, and IR in supine Supine shoulder flexion AROM Supine serratus punch 2x10 Supine shoulder ABC x 1 rep S/L ER 2x10 Standing ladder walks x 10 Attempted wand flexion in a reclined position though pt unable to perform Bent over row  x 10   06/01/24 UBE retro 4 min Finger ladder with liftoff- thumb stays in contact with wall today Supine chest press, + OH flexion with pause Supine horiz abd Supine ER Clavicular belly of SCM- stretching with cervical rotation & STM Supine AAROM shoulder flexion with inf mobs of proximal clavicle.       PATIENT EDUCATION:  Education  details: Teacher, Music of condition, POC, HEP, exercise form/rationale Person educated: Patient Education method: Explanation, Demonstration, Tactile cues, Verbal cues, and Handouts Education comprehension: verbalized understanding, returned demonstration, verbal cues required, tactile cues required, and needs further education   HOME EXERCISE PROGRAM: Access Code: [email or web address] URL: [email or web address] Date: 04/28/2024 Prepared by: Harlene Cordon    ASSESSMENT:  CLINICAL IMPRESSION:  Pt has attended 10 visits of PT thus far and is making steady progress. Updated ROM reflects increase in passive ROM. Pt most limited by flexion and abduction, though minimally limited in IR/ER. She remains challenged with active movement in standing and seated positions. Pt demonstrates improved UEFI score today to 34/80. Reviewed precautions and restrictions for current rehab phase. Advised pt in use of ice machine to manage current pain level.   Pt experiencing a flare up today and in more pain than typical. Pain is located primarily in superior ACJ area. Very tender throughout pectoral region. Educated in gentle self STM to this area.  Pt will benefit from continued PT to improve functional ROM and strength. Will continue to monitor pain level and progress as tolerated.    PERSONAL FACTORS: Patient is currently on dialysis 3 days/week are also affecting patient's functional outcome.   REHAB POTENTIAL: Good  CLINICAL DECISION MAKING: Stable/uncomplicated  EVALUATION COMPLEXITY: Low   GOALS: Goals reviewed with patient? Yes  SHORT TERM GOALS: Target date: 05/13/24  Patient will demonstrate full active range of motion of elbow without discomfort and without assistance Baseline: Goal status: MET  2.  Demonstrate full passive range of motion flexion as well as active assisted range of motion on pulleys without increase in pain Baseline:  Goal status: MET  3.  Pain to stay below 5 out of  10 with daily activities Baseline:  Goal status: MET  4.  Independent in self-care as mom will be leaving Baseline:  Goal status: met- can perform self care but has 40 yo daughter who is autistic and requires care    LONG TERM GOALS: Target date: POC date  Full active range of motion of shoulder without compensatory patterns and without impingement Baseline:  Goal status: IN PROGRESS 11/18  2.  UEFS to improve by MDC x 3 Baseline:  Goal status: IN PROGRESS 11/18  3.  Gross shoulder strength to 85% of the opposite upper extremity Baseline:  Goal status: IN PROGRESS 11/18  4.  Independent and long-term home exercise program for continued strength and stability Baseline:  Goal status: INITIAL    PLAN:  PT FREQUENCY: 1-2x/week  PT DURATION: Plan of care date  PLANNED INTERVENTIONS: 97164- PT Re-evaluation, 97750- Physical Performance Testing, 97110-Therapeutic exercises, 97530- Therapeutic activity, [id number]- Neuromuscular re-education, 97535- Self Care, 02859- Manual therapy, [phone]- Aquatic Therapy, [phone] (1-2 muscles), 20561 (3+ muscles)- Dry Needling, Patient/Family education, Taping, Joint mobilization, Spinal mobilization, Scar mobilization, and Cryotherapy.  PLAN FOR NEXT SESSION: continue with SCM and clavicular mobs PRN, AROM against gravity.  Cont per protocol.   Asberry Rodes, PTA  06/13/24 2:08 PM

## 2024-06-15 ENCOUNTER — Ambulatory Visit (HOSPITAL_BASED_OUTPATIENT_CLINIC_OR_DEPARTMENT_OTHER): Admitting: Physical Therapy

## 2024-06-15 ENCOUNTER — Encounter (HOSPITAL_BASED_OUTPATIENT_CLINIC_OR_DEPARTMENT_OTHER): Payer: Self-pay | Admitting: Physical Therapy

## 2024-06-15 DIAGNOSIS — M25511 Pain in right shoulder: Secondary | ICD-10-CM | POA: Diagnosis not present

## 2024-06-15 DIAGNOSIS — M6281 Muscle weakness (generalized): Secondary | ICD-10-CM

## 2024-06-15 DIAGNOSIS — M25611 Stiffness of right shoulder, not elsewhere classified: Secondary | ICD-10-CM

## 2024-06-15 NOTE — Therapy (Signed)
 OUTPATIENT PHYSICAL THERAPY THERAPY    Patient Name: Cindy Luna MRN: [id number] DOB:[birth year], 43 y.o., female Today's Date: 06/16/2024  END OF SESSION:  PT End of Session - 06/15/24 1034     Visit Number 11    Number of Visits 20    Date for Recertification  07/22/24    Authorization Type [employer] Dual Complete    PT Start Time 1027    PT Stop Time 1051    PT Time Calculation (min) 24 min    Activity Tolerance Patient limited by pain;Patient tolerated treatment well    Behavior During Therapy [facility] for tasks assessed/performed                 Past Medical History:  Diagnosis Date   Allergy    Anemia    Anxiety    Depression    DUB (dysfunctional uterine bleeding) 06/24/2016   End stage renal disease (HCC)    Dialysis Mon-Wed-Fri   Fatty liver    GERD (gastroesophageal reflux disease)    HLD (hyperlipidemia)    HTN (hypertension) 2007   with pregnancy but resolved after delivery   Lupus    Osteoarthritis    shoulders   Ovarian cyst 09/01/2016   Peripheral vascular disease    Pneumonia    x 1   Renal disorder    Sleep apnea    Past Surgical History:  Procedure Laterality Date   AV FISTULA PLACEMENT  2016 right, 2009 left   left and right are both working    BICEPT TENODESIS Right 04/25/2024   Procedure: TENODESIS, BICEPS;  Surgeon: Genelle Standing, MD;  Location: [facility] OR;  Service: Orthopedics;  Laterality: Right;   CESAREAN SECTION     x 1   COLONOSCOPY     OTHER SURGICAL HISTORY Left    AV fistula stents, arm   POSTERIOR LUMBAR FUSION 2 WITH HARDWARE REMOVAL Right 04/25/2024   Procedure: ARTHROSCOPY, SHOULDER WITH DEBRIDEMENT;  Surgeon: Genelle Standing, MD;  Location: [facility] OR;  Service: Orthopedics;  Laterality: Right;  RIGHT SHOULDER ARTHROSCOPY WITH DISTAL CLAVICLE RESECTION, ACROMIOPLASTY, BICEPS TENODESIS   SHOULDER INJECTION Left 04/25/2024   Procedure: INJECTION, SHOULDER;  Surgeon: Genelle Standing, MD;  Location: [facility] OR;  Service: Orthopedics;   Laterality: Left;  LEFT GLENOHUMERAL INJECTION   TUBAL LIGATION  2010   UPPER GI ENDOSCOPY     Patient Active Problem List   Diagnosis Date Noted   Biceps tendinitis of right upper extremity 04/25/2024   Arthrosis of right acromioclavicular joint 04/25/2024   Tendinitis of right rotator cuff 04/25/2024   Rotator cuff tendonitis, left 04/25/2024   Unilateral primary osteoarthritis, right hip 01/27/2024   DM (diabetes mellitus) (HCC)    Chronic constipation 11/16/2019   Eczema 11/16/2019   Environmental and seasonal allergies 07/17/2019   History of depression 07/17/2019   Patient is Jehovah's Witness 07/13/2019   Chest tightness 01/04/2019   Dysmenorrhea 05/03/2018   Class 3 severe obesity with serious comorbidity and body mass index (BMI) of 45.0 to 49.9 in adult (HCC) 04/18/2018   Lymphadenopathy of head and neck 09/29/2017   OSA (obstructive sleep apnea) 12/10/2014   ESRD (end stage renal disease) on dialysis (HCC) 11/19/2014   Lupus (HCC) 11/19/2014     REFERRING PROVIDER: Standing LITTIE Genelle MD   REFERRING DIAG:  M75.81 (ICD-10-CM) - Tendinitis of right rotator cuff     Arthroscopic extensive debridement - 29823 Subdeltoid Bursa, Supraspinatus Tendon, Anterior Labrum, and Superior Labrum Arthroscopic distal clavicle excision -  70175 Arthroscopic subacromial decompression - 29826 Arthroscopic biceps tenodesis - 70171 Rationale for Evaluation and Treatment: Rehabilitation  THERAPY DIAG:  Acute pain of right shoulder  Stiffness of right shoulder, not elsewhere classified  Muscle weakness (generalized)  ONSET DATE: DOS 04/25/24   SUBJECTIVE:                                                                                                                                                                                           SUBJECTIVE STATEMENT:  Pt had increased pain in top of R shoulder a couple of days prior to last treatment.  Pt thinks she slept on it wrong.  It  is feeling better, though still hurting.  Pt reports she has been using ice.  Pt states she needs to leave by 10:50. Pt is 7 weeks and 2 days s/p arthroscopic biceps tenodesis, DCE, subacromial decompression, and extensive debridement     PERTINENT HISTORY:  Dialysis, end-stage renal disease lupus   PAIN:  Are you having pain? No and Yes: NPRS scale: 0/10 at rest, no greater than a 4/10 pain with movement Pain location: anterior right shoulder Pain description: tight Aggravating factors: spasm Relieving factors: rest   PRECAUTIONS:  Dialysis- shoulder has to be still for 4 hours 3 days/week   RED FLAGS: None   WEIGHT BEARING RESTRICTIONS:  No  FALLS:  Has patient fallen in last 6 months? No   OCCUPATION:  Not working  PLOF:  Independent  PATIENT GOALS:  Have my mobility back  OBJECTIVE:  Note: Objective measures were completed at Evaluation unless otherwise noted.   PATIENT SURVEYS:  UEFS  Extreme difficulty/unable (0), Quite a bit of difficulty (1), Moderate difficulty (2), Little difficulty (3), No difficulty (4) Survey date:  10/3 EVAL 11/4 11/18  Any of your usual work, household or school activities 0 1 1  2. Your usual hobbies, recreational/sport activities 0 1 3   3. Lifting a bag of groceries to waist level 0 1 1   4. Lifting a bag of groceries above your head 0 0 0  5. Grooming your hair 0 2 2  6. Pushing up on your hands (I.e. from bathtub or chair) 0 1 1  7. Preparing food (I.e. peeling/cutting) 0 2 1  8. Driving  0 1 1  9. Vacuuming, sweeping, or raking 0 1 2  10. Dressing  1 1 4   11. Doing up buttons 3 4 1   12. Using tools/appliances 0 1 1  13. Opening doors 0 1 4  14. Cleaning  0 1 1  15. Tying or lacing shoes 1 4 4   16.  Sleeping  2 2 1   17. Laundering clothes (I.e. washing, ironing, folding) 0 2 3  18. Opening a jar 1 1 3   19. Throwing a ball 0 1 1  20. Carrying a small suitcase with your affected limb.  0 0 0  Score total:  8/80 28/80  34/80     POSTURE:  Eval: Rounded shoulder in sling as expected post op  HAND DOMINANCE:  Right   Body Part #1 Shoulder  PALPATION: EVAL: no s/s of infection  UPPER EXTREMITY ROM:  Passive ROM Right eval A/AAROM 11/4 Right R Passive 11/18  Shoulder flexion 50 in pendulum 65/ 140 on finger ladder 153  Shoulder extension     Shoulder abduction  50 143  Shoulder adduction     Shoulder extension     Shoulder internal rotation   51  Shoulder external rotation   75  Elbow flexion WFL    Elbow extension -10 0 -5  Wrist  WFL       (Blank rows = not tested)   UPPER EXTREMITY MMT:  MMT Right 11/18 Left 11/18  Shoulder flexion 3 4+  Shoulder extension    Shoulder abduction 3- 4-  Shoulder adduction    Shoulder extension    Shoulder internal rotation 4+ 5  Shoulder external rotation 4+ 5  Middle trapezius    Lower trapezius    Elbow flexion 4+ 4+  Elbow extension 4 4+  Wrist flexion    Wrist extension    Wrist ulnar deviation    Wrist radial deviation    Wrist pronation    Wrist supination    Grip strength     (Blank rows = not tested)     TREATMENT DATE:   06/15/24 Pulleys in flexion and scaption Pt received R shoulder PROM in flexion, scaption, ER, and IR per pt and tissue tolerance.  Supine serratus punch with PT assistance x 10 Supine wand flexion with limited range x10 Supine flexion AROM with PT assistance x 10 reps S/L ER 2x10  06/13/24 Pulleys in flexion  (challenging today) x29min PROM R shoulder Updated ROM and strength Reviewed goals UEFI Supine wand flexion x10 STM to pectoral mm, UT, and SCM Wall ladder x4 (increased pain today, so stopped) Gentle pec stretch at wall (pain free only)    06/08/24 Reviewed pt presentation, HEP compliance, pain level, and response to prior treatment. Pulleys in flexion and scaption Pt received R shoulder PROM in flexion, scaption, ER, and IR in supine Supine shoulder flexion AROM Supine serratus  punch 2x10 Supine shoulder ABC x 1 rep S/L ER 2x10 Standing ladder walks x 10 Attempted wand flexion in a reclined position though pt unable to perform Bent over row  x 10   06/01/24 UBE retro 4 min Finger ladder with liftoff- thumb stays in contact with wall today Supine chest press, + OH flexion with pause Supine horiz abd Supine ER Clavicular belly of SCM- stretching with cervical rotation & STM Supine AAROM shoulder flexion with inf mobs of proximal clavicle.       PATIENT EDUCATION:  Education details: Teacher, Music of condition, POC, HEP, exercise form/rationale Person educated: Patient Education method: Explanation, Demonstration, Tactile cues, Verbal cues, and Handouts Education comprehension: verbalized understanding, returned demonstration, verbal cues required, tactile cues required, and needs further education   HOME EXERCISE PROGRAM: Access Code: [email or web address] URL: [email or web address] Date: 04/28/2024 Prepared by: Harlene Cordon    ASSESSMENT:  CLINICAL IMPRESSION:  Treatment time limited today due to pt needing  to leave appointment early for another appointment.  Pt reports having increased pain prior to last treatment for no specific reason except possibly her sleeping position.  She states her shoulder is feeling better, though still hurting.  PT attempted supine serratus punches with PT assistance though Pt had pain.  PT had pt stop serratus punch after 1st set due to pain.  Pt limited her lowering range with supine wand flexion for improved comfort and pain.  Pt was limited with exercises due to pain.  She tolerated PROM well and reports no increased pain after treatment.  Pt should benefit from continued skilled PT per protocol to address ongoing goals and impairments and improve overall function.    PERSONAL FACTORS: Patient is currently on dialysis 3 days/week are also affecting patient's functional outcome.   REHAB POTENTIAL: Good  CLINICAL  DECISION MAKING: Stable/uncomplicated  EVALUATION COMPLEXITY: Low   GOALS: Goals reviewed with patient? Yes  SHORT TERM GOALS: Target date: 05/13/24  Patient will demonstrate full active range of motion of elbow without discomfort and without assistance Baseline: Goal status: MET  2.  Demonstrate full passive range of motion flexion as well as active assisted range of motion on pulleys without increase in pain Baseline:  Goal status: MET  3.  Pain to stay below 5 out of 10 with daily activities Baseline:  Goal status: MET  4.  Independent in self-care as mom will be leaving Baseline:  Goal status: met- can perform self care but has 58 yo daughter who is autistic and requires care    LONG TERM GOALS: Target date: POC date  Full active range of motion of shoulder without compensatory patterns and without impingement Baseline:  Goal status: IN PROGRESS 11/18  2.  UEFS to improve by MDC x 3 Baseline:  Goal status: IN PROGRESS 11/18  3.  Gross shoulder strength to 85% of the opposite upper extremity Baseline:  Goal status: IN PROGRESS 11/18  4.  Independent and long-term home exercise program for continued strength and stability Baseline:  Goal status: INITIAL    PLAN:  PT FREQUENCY: 1-2x/week  PT DURATION: Plan of care date  PLANNED INTERVENTIONS: 97164- PT Re-evaluation, 97750- Physical Performance Testing, 97110-Therapeutic exercises, 97530- Therapeutic activity, [id number]- Neuromuscular re-education, 97535- Self Care, 02859- Manual therapy, [phone]- Aquatic Therapy, [phone] (1-2 muscles), 20561 (3+ muscles)- Dry Needling, Patient/Family education, Taping, Joint mobilization, Spinal mobilization, Scar mobilization, and Cryotherapy.  PLAN FOR NEXT SESSION: continue with SCM and clavicular mobs PRN, AROM against gravity.  Cont per protocol.   Leigh Minerva III PT, DPT 06/16/24 11:37 AM

## 2024-06-20 ENCOUNTER — Encounter (HOSPITAL_BASED_OUTPATIENT_CLINIC_OR_DEPARTMENT_OTHER)

## 2024-06-21 ENCOUNTER — Ambulatory Visit (HOSPITAL_BASED_OUTPATIENT_CLINIC_OR_DEPARTMENT_OTHER): Payer: Self-pay | Admitting: Physical Therapy

## 2024-06-21 DIAGNOSIS — M25511 Pain in right shoulder: Secondary | ICD-10-CM

## 2024-06-21 DIAGNOSIS — M6281 Muscle weakness (generalized): Secondary | ICD-10-CM

## 2024-06-21 DIAGNOSIS — M25611 Stiffness of right shoulder, not elsewhere classified: Secondary | ICD-10-CM

## 2024-06-21 NOTE — Therapy (Signed)
 OUTPATIENT PHYSICAL THERAPY THERAPY    Patient Name: Cindy Luna MRN: [id number] DOB:[birth year], 43 y.o., female Today's Date: 06/22/2024  END OF SESSION:  PT End of Session - 06/21/24 1408     Visit Number 12    Number of Visits 20    Date for Recertification  07/22/24    Authorization Type [employer] Dual Complete    PT Start Time 1320    PT Stop Time 1405    PT Time Calculation (min) 45 min    Activity Tolerance Patient tolerated treatment well    Behavior During Therapy [facility] for tasks assessed/performed                  Past Medical History:  Diagnosis Date   Allergy    Anemia    Anxiety    Depression    DUB (dysfunctional uterine bleeding) 06/24/2016   End stage renal disease (HCC)    Dialysis Mon-Wed-Fri   Fatty liver    GERD (gastroesophageal reflux disease)    HLD (hyperlipidemia)    HTN (hypertension) 2007   with pregnancy but resolved after delivery   Lupus    Osteoarthritis    shoulders   Ovarian cyst 09/01/2016   Peripheral vascular disease    Pneumonia    x 1   Renal disorder    Sleep apnea    Past Surgical History:  Procedure Laterality Date   AV FISTULA PLACEMENT  2016 right, 2009 left   left and right are both working    BICEPT TENODESIS Right 04/25/2024   Procedure: TENODESIS, BICEPS;  Surgeon: Genelle Standing, MD;  Location: [facility] OR;  Service: Orthopedics;  Laterality: Right;   CESAREAN SECTION     x 1   COLONOSCOPY     OTHER SURGICAL HISTORY Left    AV fistula stents, arm   POSTERIOR LUMBAR FUSION 2 WITH HARDWARE REMOVAL Right 04/25/2024   Procedure: ARTHROSCOPY, SHOULDER WITH DEBRIDEMENT;  Surgeon: Genelle Standing, MD;  Location: [facility] OR;  Service: Orthopedics;  Laterality: Right;  RIGHT SHOULDER ARTHROSCOPY WITH DISTAL CLAVICLE RESECTION, ACROMIOPLASTY, BICEPS TENODESIS   SHOULDER INJECTION Left 04/25/2024   Procedure: INJECTION, SHOULDER;  Surgeon: Genelle Standing, MD;  Location: [facility] OR;  Service: Orthopedics;  Laterality: Left;  LEFT  GLENOHUMERAL INJECTION   TUBAL LIGATION  2010   UPPER GI ENDOSCOPY     Patient Active Problem List   Diagnosis Date Noted   Biceps tendinitis of right upper extremity 04/25/2024   Arthrosis of right acromioclavicular joint 04/25/2024   Tendinitis of right rotator cuff 04/25/2024   Rotator cuff tendonitis, left 04/25/2024   Unilateral primary osteoarthritis, right hip 01/27/2024   DM (diabetes mellitus) (HCC)    Chronic constipation 11/16/2019   Eczema 11/16/2019   Environmental and seasonal allergies 07/17/2019   History of depression 07/17/2019   Patient is Jehovah's Witness 07/13/2019   Chest tightness 01/04/2019   Dysmenorrhea 05/03/2018   Class 3 severe obesity with serious comorbidity and body mass index (BMI) of 45.0 to 49.9 in adult (HCC) 04/18/2018   Lymphadenopathy of head and neck 09/29/2017   OSA (obstructive sleep apnea) 12/10/2014   ESRD (end stage renal disease) on dialysis (HCC) 11/19/2014   Lupus (HCC) 11/19/2014     REFERRING PROVIDER: Standing LITTIE Genelle MD   REFERRING DIAG:  M75.81 (ICD-10-CM) - Tendinitis of right rotator cuff     Arthroscopic extensive debridement - 29823 Subdeltoid Bursa, Supraspinatus Tendon, Anterior Labrum, and Superior Labrum Arthroscopic distal clavicle excision - 70175 Arthroscopic  subacromial decompression - 70173 Arthroscopic biceps tenodesis - 70171 Rationale for Evaluation and Treatment: Rehabilitation  THERAPY DIAG:  Acute pain of right shoulder  Stiffness of right shoulder, not elsewhere classified  Muscle weakness (generalized)  ONSET DATE: DOS 04/25/24   SUBJECTIVE:                                                                                                                                                                                           SUBJECTIVE STATEMENT:   Pt is 8 weeks and 1 day s/p arthroscopic biceps tenodesis, DCE, subacromial decompression, and extensive debridement.  Pt states her shoulder is  feeling a little bit better.  Pt is performing her HEP though states the exercises still hurt.       PERTINENT HISTORY:  Dialysis, end-stage renal disease lupus   PAIN:  Are you having pain? No and Yes: NPRS scale: 0/10 at rest, no greater than a 4/10 pain with movement Pain location: anterior right shoulder Pain description: tight Aggravating factors: spasm Relieving factors: rest   PRECAUTIONS:  Dialysis- shoulder has to be still for 4 hours 3 days/week   RED FLAGS: None   WEIGHT BEARING RESTRICTIONS:  No  FALLS:  Has patient fallen in last 6 months? No   OCCUPATION:  Not working  PLOF:  Independent  PATIENT GOALS:  Have my mobility back  OBJECTIVE:  Note: Objective measures were completed at Evaluation unless otherwise noted.   PATIENT SURVEYS:  UEFS  Extreme difficulty/unable (0), Quite a bit of difficulty (1), Moderate difficulty (2), Little difficulty (3), No difficulty (4) Survey date:  10/3 EVAL 11/4 11/18  Any of your usual work, household or school activities 0 1 1  2. Your usual hobbies, recreational/sport activities 0 1 3   3. Lifting a bag of groceries to waist level 0 1 1   4. Lifting a bag of groceries above your head 0 0 0  5. Grooming your hair 0 2 2  6. Pushing up on your hands (I.e. from bathtub or chair) 0 1 1  7. Preparing food (I.e. peeling/cutting) 0 2 1  8. Driving  0 1 1  9. Vacuuming, sweeping, or raking 0 1 2  10. Dressing  1 1 4   11. Doing up buttons 3 4 1   12. Using tools/appliances 0 1 1  13. Opening doors 0 1 4  14. Cleaning  0 1 1  15. Tying or lacing shoes 1 4 4   16. Sleeping  2 2 1   17. Laundering clothes (I.e. washing, ironing, folding) 0 2 3  18. Opening a jar 1 1 3   19.  Throwing a ball 0 1 1  20. Carrying a small suitcase with your affected limb.  0 0 0  Score total:  8/80 28/80 34/80     POSTURE:  Eval: Rounded shoulder in sling as expected post op  HAND DOMINANCE:  Right   Body Part #1  Shoulder  PALPATION: EVAL: no s/s of infection  UPPER EXTREMITY ROM:  Passive ROM Right eval A/AAROM 11/4 Right R Passive 11/18  Shoulder flexion 50 in pendulum 65/ 140 on finger ladder 153  Shoulder extension     Shoulder abduction  50 143  Shoulder adduction     Shoulder extension     Shoulder internal rotation   51  Shoulder external rotation   75  Elbow flexion WFL    Elbow extension -10 0 -5  Wrist  WFL       (Blank rows = not tested)   UPPER EXTREMITY MMT:  MMT Right 11/18 Left 11/18  Shoulder flexion 3 4+  Shoulder extension    Shoulder abduction 3- 4-  Shoulder adduction    Shoulder extension    Shoulder internal rotation 4+ 5  Shoulder external rotation 4+ 5  Middle trapezius    Lower trapezius    Elbow flexion 4+ 4+  Elbow extension 4 4+  Wrist flexion    Wrist extension    Wrist ulnar deviation    Wrist radial deviation    Wrist pronation    Wrist supination    Grip strength     (Blank rows = not tested)     TREATMENT DATE:  06/21/24 Pulleys in flexion and scaption Pt received R shoulder PROM in flexion, scaption, ER, and IR per pt and tissue tolerance.  Supine flexion AROM/AAROM x 10 reps, AROM x 10 reps Attempted wand flexion in a reclined position Supine flexion AROM with head elevated 2x10 S/L ER 2x10 S/L shoulder abduction 2x10 Shoulder submax isometric with 5 sec hold flexion and abduction   06/15/24 Pulleys in flexion and scaption Pt received R shoulder PROM in flexion, scaption, ER, and IR per pt and tissue tolerance.  Supine serratus punch with PT assistance x 10 Supine wand flexion with limited range x10 Supine flexion AROM with PT assistance x 10 reps S/L ER 2x10  06/13/24 Pulleys in flexion  (challenging today) x7min PROM R shoulder Updated ROM and strength Reviewed goals UEFI Supine wand flexion x10 STM to pectoral mm, UT, and SCM Wall ladder x4 (increased pain today, so stopped) Gentle pec stretch at wall  (pain free only)    06/08/24 Reviewed pt presentation, HEP compliance, pain level, and response to prior treatment. Pulleys in flexion and scaption Pt received R shoulder PROM in flexion, scaption, ER, and IR in supine Supine shoulder flexion AROM Supine serratus punch 2x10 Supine shoulder ABC x 1 rep S/L ER 2x10 Standing ladder walks x 10 Attempted wand flexion in a reclined position though pt unable to perform Bent over row  x 10   06/01/24 UBE retro 4 min Finger ladder with liftoff- thumb stays in contact with wall today Supine chest press, + OH flexion with pause Supine horiz abd Supine ER Clavicular belly of SCM- stretching with cervical rotation & STM Supine AAROM shoulder flexion with inf mobs of proximal clavicle.       PATIENT EDUCATION:  Education details: Teacher, Music of condition, POC, HEP, exercise form/rationale Person educated: Patient Education method: Explanation, Demonstration, Tactile cues, Verbal cues, and Handouts Education comprehension: verbalized understanding, returned demonstration, verbal cues required,  tactile cues required, and needs further education   HOME EXERCISE PROGRAM: Access Code: [email or web address] URL: [email or web address] Date: 04/28/2024 Prepared by: Harlene Cordon    ASSESSMENT:  CLINICAL IMPRESSION:  Pt presents to treatment reporting no pain at rest and has pain with certain movements.  Pt able to perform AROM in gravity minimized positions well including supine flexion and S/L abduction.  She continues to have much difficulty with UE elevation when increasing gravity.  Pt was unable to perform supine flexion AAROM in a reclined position.  However, she was able to perform supine flexion AAROM with head elevated with pillows.  PT progressed pt with submax isometric flexion and abduction which she tolerated well.  Pt also tolerated shoulder PROM well.  She responded well to treatment reporting no pain after treatment.  Pt  should benefit from continued skilled PT per protocol to address ongoing goals and impairments and improve overall function.   PERSONAL FACTORS: Patient is currently on dialysis 3 days/week are also affecting patient's functional outcome.   REHAB POTENTIAL: Good  CLINICAL DECISION MAKING: Stable/uncomplicated  EVALUATION COMPLEXITY: Low   GOALS: Goals reviewed with patient? Yes  SHORT TERM GOALS: Target date: 05/13/24  Patient will demonstrate full active range of motion of elbow without discomfort and without assistance Baseline: Goal status: MET  2.  Demonstrate full passive range of motion flexion as well as active assisted range of motion on pulleys without increase in pain Baseline:  Goal status: MET  3.  Pain to stay below 5 out of 10 with daily activities Baseline:  Goal status: MET  4.  Independent in self-care as mom will be leaving Baseline:  Goal status: met- can perform self care but has 22 yo daughter who is autistic and requires care    LONG TERM GOALS: Target date: POC date  Full active range of motion of shoulder without compensatory patterns and without impingement Baseline:  Goal status: IN PROGRESS 11/18  2.  UEFS to improve by MDC x 3 Baseline:  Goal status: IN PROGRESS 11/18  3.  Gross shoulder strength to 85% of the opposite upper extremity Baseline:  Goal status: IN PROGRESS 11/18  4.  Independent and long-term home exercise program for continued strength and stability Baseline:  Goal status: INITIAL    PLAN:  PT FREQUENCY: 1-2x/week  PT DURATION: Plan of care date  PLANNED INTERVENTIONS: 97164- PT Re-evaluation, 97750- Physical Performance Testing, 97110-Therapeutic exercises, 97530- Therapeutic activity, [id number]- Neuromuscular re-education, 97535- Self Care, 02859- Manual therapy, [phone]- Aquatic Therapy, [phone] (1-2 muscles), 20561 (3+ muscles)- Dry Needling, Patient/Family education, Taping, Joint mobilization, Spinal mobilization,  Scar mobilization, and Cryotherapy.  PLAN FOR NEXT SESSION: continue with SCM and clavicular mobs PRN, AROM against gravity.  Cont per protocol.   Leigh Minerva III PT, DPT 06/22/24 8:41 AM

## 2024-06-22 ENCOUNTER — Encounter (HOSPITAL_BASED_OUTPATIENT_CLINIC_OR_DEPARTMENT_OTHER): Payer: Self-pay | Admitting: Physical Therapy

## 2024-06-27 ENCOUNTER — Ambulatory Visit (HOSPITAL_BASED_OUTPATIENT_CLINIC_OR_DEPARTMENT_OTHER)

## 2024-06-27 ENCOUNTER — Encounter (HOSPITAL_BASED_OUTPATIENT_CLINIC_OR_DEPARTMENT_OTHER): Payer: Self-pay

## 2024-06-27 DIAGNOSIS — M25611 Stiffness of right shoulder, not elsewhere classified: Secondary | ICD-10-CM | POA: Diagnosis present

## 2024-06-27 DIAGNOSIS — M6281 Muscle weakness (generalized): Secondary | ICD-10-CM | POA: Diagnosis present

## 2024-06-27 DIAGNOSIS — M25511 Pain in right shoulder: Secondary | ICD-10-CM | POA: Insufficient documentation

## 2024-06-27 NOTE — Therapy (Signed)
 OUTPATIENT PHYSICAL THERAPY THERAPY    Patient Name: Cindy Luna MRN: [id number] DOB:Jul 30, 1980, 43 y.o., female Today's Date: 06/27/2024  END OF SESSION:  PT End of Session - 06/27/24 1026     Visit Number 13    Number of Visits 20    Date for Recertification  07/22/24    Authorization Type UHC Dual Complete    Progress Note Due on Visit 10    PT Start Time 1026    PT Stop Time 1104    PT Time Calculation (min) 38 min    Activity Tolerance Patient tolerated treatment well    Behavior During Therapy [facility] for tasks assessed/performed                   Past Medical History:  Diagnosis Date   Allergy    Anemia    Anxiety    Depression    DUB (dysfunctional uterine bleeding) 06/24/2016   End stage renal disease (HCC)    Dialysis Mon-Wed-Fri   Fatty liver    GERD (gastroesophageal reflux disease)    HLD (hyperlipidemia)    HTN (hypertension) 2007   with pregnancy but resolved after delivery   Lupus    Osteoarthritis    shoulders   Ovarian cyst 09/01/2016   Peripheral vascular disease    Pneumonia    x 1   Renal disorder    Sleep apnea    Past Surgical History:  Procedure Laterality Date   AV FISTULA PLACEMENT  2016 right, 2009 left   left and right are both working    BICEPT TENODESIS Right 04/25/2024   Procedure: TENODESIS, BICEPS;  Surgeon: Genelle Standing, MD;  Location: [facility] OR;  Service: Orthopedics;  Laterality: Right;   CESAREAN SECTION     x 1   COLONOSCOPY     OTHER SURGICAL HISTORY Left    AV fistula stents, arm   POSTERIOR LUMBAR FUSION 2 WITH HARDWARE REMOVAL Right 04/25/2024   Procedure: ARTHROSCOPY, SHOULDER WITH DEBRIDEMENT;  Surgeon: Genelle Standing, MD;  Location: [facility] OR;  Service: Orthopedics;  Laterality: Right;  RIGHT SHOULDER ARTHROSCOPY WITH DISTAL CLAVICLE RESECTION, ACROMIOPLASTY, BICEPS TENODESIS   SHOULDER INJECTION Left 04/25/2024   Procedure: INJECTION, SHOULDER;  Surgeon: Genelle Standing, MD;  Location: [facility] OR;  Service:  Orthopedics;  Laterality: Left;  LEFT GLENOHUMERAL INJECTION   TUBAL LIGATION  2010   UPPER GI ENDOSCOPY     Patient Active Problem List   Diagnosis Date Noted   Biceps tendinitis of right upper extremity 04/25/2024   Arthrosis of right acromioclavicular joint 04/25/2024   Tendinitis of right rotator cuff 04/25/2024   Rotator cuff tendonitis, left 04/25/2024   Unilateral primary osteoarthritis, right hip 01/27/2024   DM (diabetes mellitus) (HCC)    Chronic constipation 11/16/2019   Eczema 11/16/2019   Environmental and seasonal allergies 07/17/2019   History of depression 07/17/2019   Patient is Jehovah's Witness 07/13/2019   Chest tightness 01/04/2019   Dysmenorrhea 05/03/2018   Class 3 severe obesity with serious comorbidity and body mass index (BMI) of 45.0 to 49.9 in adult (HCC) 04/18/2018   Lymphadenopathy of head and neck 09/29/2017   OSA (obstructive sleep apnea) 12/10/2014   ESRD (end stage renal disease) on dialysis (HCC) 11/19/2014   Lupus (HCC) 11/19/2014     REFERRING PROVIDER: Standing LITTIE Genelle MD   REFERRING DIAG:  M75.81 (ICD-10-CM) - Tendinitis of right rotator cuff     Arthroscopic extensive debridement - 29823 Subdeltoid Bursa, Supraspinatus Tendon, Anterior Labrum,  and Superior Labrum Arthroscopic distal clavicle excision - 70175 Arthroscopic subacromial decompression - 70173 Arthroscopic biceps tenodesis - 70171 Rationale for Evaluation and Treatment: Rehabilitation  THERAPY DIAG:  Acute pain of right shoulder  Stiffness of right shoulder, not elsewhere classified  Muscle weakness (generalized)  ONSET DATE: DOS 04/25/24   SUBJECTIVE:                                                                                                                                                                                           SUBJECTIVE STATEMENT:   Pt reports no pain at entry, only mild soreness. Still hurts when laying in bed. Likes to sleep on R side  normally. Difficulty raising arm due to weakness.     PERTINENT HISTORY:  Dialysis, end-stage renal disease lupus   PAIN:  Are you having pain? No and Yes: NPRS scale: 0/10 at rest, no greater than a 4/10 pain with movement Pain location: anterior right shoulder Pain description: tight Aggravating factors: spasm Relieving factors: rest   PRECAUTIONS:  Dialysis- shoulder has to be still for 4 hours 3 days/week   RED FLAGS: None   WEIGHT BEARING RESTRICTIONS:  No  FALLS:  Has patient fallen in last 6 months? No   OCCUPATION:  Not working  PLOF:  Independent  PATIENT GOALS:  Have my mobility back  OBJECTIVE:  Note: Objective measures were completed at Evaluation unless otherwise noted.   PATIENT SURVEYS:  UEFS  Extreme difficulty/unable (0), Quite a bit of difficulty (1), Moderate difficulty (2), Little difficulty (3), No difficulty (4) Survey date:  10/3 EVAL 11/4 11/18  Any of your usual work, household or school activities 0 1 1  2. Your usual hobbies, recreational/sport activities 0 1 3   3. Lifting a bag of groceries to waist level 0 1 1   4. Lifting a bag of groceries above your head 0 0 0  5. Grooming your hair 0 2 2  6. Pushing up on your hands (I.e. from bathtub or chair) 0 1 1  7. Preparing food (I.e. peeling/cutting) 0 2 1  8. Driving  0 1 1  9. Vacuuming, sweeping, or raking 0 1 2  10. Dressing  1 1 4   11. Doing up buttons 3 4 1   12. Using tools/appliances 0 1 1  13. Opening doors 0 1 4  14. Cleaning  0 1 1  15. Tying or lacing shoes 1 4 4   16. Sleeping  2 2 1   17. Laundering clothes (I.e. washing, ironing, folding) 0 2 3  18. Opening a jar 1 1 3   19. Throwing a ball 0  1 1  20. Carrying a small suitcase with your affected limb.  0 0 0  Score total:  8/80 28/80 34/80     POSTURE:  Eval: Rounded shoulder in sling as expected post op  HAND DOMINANCE:  Right   Body Part #1 Shoulder  PALPATION: EVAL: no s/s of infection  UPPER  EXTREMITY ROM:  Passive ROM Right eval A/AAROM 11/4 Right R Passive 11/18  Shoulder flexion 50 in pendulum 65/ 140 on finger ladder 153  Shoulder extension     Shoulder abduction  50 143  Shoulder adduction     Shoulder extension     Shoulder internal rotation   51  Shoulder external rotation   75  Elbow flexion WFL    Elbow extension -10 0 -5  Wrist  WFL       (Blank rows = not tested)   UPPER EXTREMITY MMT:  MMT Right 11/18 Left 11/18  Shoulder flexion 3 4+  Shoulder extension    Shoulder abduction 3- 4-  Shoulder adduction    Shoulder extension    Shoulder internal rotation 4+ 5  Shoulder external rotation 4+ 5  Middle trapezius    Lower trapezius    Elbow flexion 4+ 4+  Elbow extension 4 4+  Wrist flexion    Wrist extension    Wrist ulnar deviation    Wrist radial deviation    Wrist pronation    Wrist supination    Grip strength     (Blank rows = not tested)     TREATMENT DATE:   06/27/24 Pulleys each flexion and extension Standing wand flexion (modified) ~10 reps Finger ladder with lift off at top x10 Standing press out x10 with 1#, x10 0# Theraband row GTB x30 Chicken wing in standing attempts x5 (unable) Supine active flexion 1# x10 (difficult) PROM R shoulder all planes S/l ER 1# 2x10 (0# last 5 reps) HEP review    06/21/24 Pulleys in flexion and scaption Pt received R shoulder PROM in flexion, scaption, ER, and IR per pt and tissue tolerance.  Supine flexion AROM/AAROM x 10 reps, AROM x 10 reps Attempted wand flexion in a reclined position Supine flexion AROM with head elevated 2x10 S/L ER 2x10 S/L shoulder abduction 2x10 Shoulder submax isometric with 5 sec hold flexion and abduction   06/15/24 Pulleys in flexion and scaption Pt received R shoulder PROM in flexion, scaption, ER, and IR per pt and tissue tolerance.  Supine serratus punch with PT assistance x 10 Supine wand flexion with limited range x10 Supine flexion AROM  with PT assistance x 10 reps S/L ER 2x10  06/13/24 Pulleys in flexion  (challenging today) x96min PROM R shoulder Updated ROM and strength Reviewed goals UEFI Supine wand flexion x10 STM to pectoral mm, UT, and SCM Wall ladder x4 (increased pain today, so stopped) Gentle pec stretch at wall (pain free only)    06/08/24 Reviewed pt presentation, HEP compliance, pain level, and response to prior treatment. Pulleys in flexion and scaption Pt received R shoulder PROM in flexion, scaption, ER, and IR in supine Supine shoulder flexion AROM Supine serratus punch 2x10 Supine shoulder ABC x 1 rep S/L ER 2x10 Standing ladder walks x 10 Attempted wand flexion in a reclined position though pt unable to perform Bent over row  x 10   06/01/24 UBE retro 4 min Finger ladder with liftoff- thumb stays in contact with wall today Supine chest press, + OH flexion with pause Supine horiz abd Supine  ER Clavicular belly of SCM- stretching with cervical rotation & STM Supine AAROM shoulder flexion with inf mobs of proximal clavicle.       PATIENT EDUCATION:  Education details: Teacher, Music of condition, POC, HEP, exercise form/rationale Person educated: Patient Education method: Explanation, Demonstration, Tactile cues, Verbal cues, and Handouts Education comprehension: verbalized understanding, returned demonstration, verbal cues required, tactile cues required, and needs further education   HOME EXERCISE PROGRAM: Access Code: [email or web address] URL: [email or web address] Date: 04/28/2024 Prepared by: Harlene Cordon    ASSESSMENT:  CLINICAL IMPRESSION:  Pt continues to have difficulty raising arm against gravity. Pt very challenged by addition of 1# weight with standing press out and supine active shoulder flexion, but able to complete the task without pain. Instructed in performance of this at home to progress strength. She performed s/l ER with 1# with mild fatigue. Mild  tightness in each plane at end range PROM. Pt will benefit from continued PT to improve functional ROM and strength.   PERSONAL FACTORS: Patient is currently on dialysis 3 days/week are also affecting patient's functional outcome.   REHAB POTENTIAL: Good  CLINICAL DECISION MAKING: Stable/uncomplicated  EVALUATION COMPLEXITY: Low   GOALS: Goals reviewed with patient? Yes  SHORT TERM GOALS: Target date: 05/13/24  Patient will demonstrate full active range of motion of elbow without discomfort and without assistance Baseline: Goal status: MET  2.  Demonstrate full passive range of motion flexion as well as active assisted range of motion on pulleys without increase in pain Baseline:  Goal status: MET  3.  Pain to stay below 5 out of 10 with daily activities Baseline:  Goal status: MET  4.  Independent in self-care as mom will be leaving Baseline:  Goal status: met- can perform self care but has 77 yo daughter who is autistic and requires care    LONG TERM GOALS: Target date: POC date  Full active range of motion of shoulder without compensatory patterns and without impingement Baseline:  Goal status: IN PROGRESS 11/18  2.  UEFS to improve by MDC x 3 Baseline:  Goal status: IN PROGRESS 11/18  3.  Gross shoulder strength to 85% of the opposite upper extremity Baseline:  Goal status: IN PROGRESS 11/18  4.  Independent and long-term home exercise program for continued strength and stability Baseline:  Goal status: INITIAL    PLAN:  PT FREQUENCY: 1-2x/week  PT DURATION: Plan of care date  PLANNED INTERVENTIONS: 97164- PT Re-evaluation, 97750- Physical Performance Testing, 97110-Therapeutic exercises, 97530- Therapeutic activity, [id number]- Neuromuscular re-education, 97535- Self Care, 02859- Manual therapy, [phone]- Aquatic Therapy, [phone] (1-2 muscles), 20561 (3+ muscles)- Dry Needling, Patient/Family education, Taping, Joint mobilization, Spinal mobilization, Scar  mobilization, and Cryotherapy.  PLAN FOR NEXT SESSION: continue with SCM and clavicular mobs PRN, AROM against gravity.  Cont per protocol.   Asberry Rodes, PTA  06/27/24 12:18 PM

## 2024-06-29 ENCOUNTER — Ambulatory Visit (HOSPITAL_BASED_OUTPATIENT_CLINIC_OR_DEPARTMENT_OTHER): Admitting: Physical Therapy

## 2024-06-29 ENCOUNTER — Encounter (HOSPITAL_BASED_OUTPATIENT_CLINIC_OR_DEPARTMENT_OTHER): Payer: Self-pay

## 2024-07-04 ENCOUNTER — Encounter (HOSPITAL_BASED_OUTPATIENT_CLINIC_OR_DEPARTMENT_OTHER): Payer: Self-pay

## 2024-07-04 ENCOUNTER — Ambulatory Visit (HOSPITAL_BASED_OUTPATIENT_CLINIC_OR_DEPARTMENT_OTHER)

## 2024-07-04 DIAGNOSIS — M25511 Pain in right shoulder: Secondary | ICD-10-CM | POA: Diagnosis not present

## 2024-07-04 DIAGNOSIS — M25611 Stiffness of right shoulder, not elsewhere classified: Secondary | ICD-10-CM

## 2024-07-04 DIAGNOSIS — M6281 Muscle weakness (generalized): Secondary | ICD-10-CM

## 2024-07-04 NOTE — Therapy (Signed)
 OUTPATIENT PHYSICAL THERAPY THERAPY    Patient Name: Cindy Luna MRN: [id number] DOB:Nov 20, 1980, 43 y.o., female Today's Date: 07/04/2024  END OF SESSION:  PT End of Session - 07/04/24 1022     Visit Number 14    Number of Visits 20    Date for Recertification  07/22/24    Authorization Type [employer] Dual Complete    PT Start Time 1016    PT Stop Time 1100    PT Time Calculation (min) 44 min    Activity Tolerance Patient tolerated treatment well    Behavior During Therapy [facility] for tasks assessed/performed                    Past Medical History:  Diagnosis Date   Allergy    Anemia    Anxiety    Depression    DUB (dysfunctional uterine bleeding) 06/24/2016   End stage renal disease (HCC)    Dialysis Mon-Wed-Fri   Fatty liver    GERD (gastroesophageal reflux disease)    HLD (hyperlipidemia)    HTN (hypertension) 2007   with pregnancy but resolved after delivery   Lupus    Osteoarthritis    shoulders   Ovarian cyst 09/01/2016   Peripheral vascular disease    Pneumonia    x 1   Renal disorder    Sleep apnea    Past Surgical History:  Procedure Laterality Date   AV FISTULA PLACEMENT  2016 right, 2009 left   left and right are both working    BICEPT TENODESIS Right 04/25/2024   Procedure: TENODESIS, BICEPS;  Surgeon: Genelle Standing, MD;  Location: [facility] OR;  Service: Orthopedics;  Laterality: Right;   CESAREAN SECTION     x 1   COLONOSCOPY     OTHER SURGICAL HISTORY Left    AV fistula stents, arm   POSTERIOR LUMBAR FUSION 2 WITH HARDWARE REMOVAL Right 04/25/2024   Procedure: ARTHROSCOPY, SHOULDER WITH DEBRIDEMENT;  Surgeon: Genelle Standing, MD;  Location: [facility] OR;  Service: Orthopedics;  Laterality: Right;  RIGHT SHOULDER ARTHROSCOPY WITH DISTAL CLAVICLE RESECTION, ACROMIOPLASTY, BICEPS TENODESIS   SHOULDER INJECTION Left 04/25/2024   Procedure: INJECTION, SHOULDER;  Surgeon: Genelle Standing, MD;  Location: [facility] OR;  Service: Orthopedics;  Laterality: Left;   LEFT GLENOHUMERAL INJECTION   TUBAL LIGATION  2010   UPPER GI ENDOSCOPY     Patient Active Problem List   Diagnosis Date Noted   Biceps tendinitis of right upper extremity 04/25/2024   Arthrosis of right acromioclavicular joint 04/25/2024   Tendinitis of right rotator cuff 04/25/2024   Rotator cuff tendonitis, left 04/25/2024   Unilateral primary osteoarthritis, right hip 01/27/2024   DM (diabetes mellitus) (HCC)    Chronic constipation 11/16/2019   Eczema 11/16/2019   Environmental and seasonal allergies 07/17/2019   History of depression 07/17/2019   Patient is Jehovah's Witness 07/13/2019   Chest tightness 01/04/2019   Dysmenorrhea 05/03/2018   Class 3 severe obesity with serious comorbidity and body mass index (BMI) of 45.0 to 49.9 in adult (HCC) 04/18/2018   Lymphadenopathy of head and neck 09/29/2017   OSA (obstructive sleep apnea) 12/10/2014   ESRD (end stage renal disease) on dialysis (HCC) 11/19/2014   Lupus (HCC) 11/19/2014     REFERRING PROVIDER: Standing LITTIE Genelle MD   REFERRING DIAG:  M75.81 (ICD-10-CM) - Tendinitis of right rotator cuff     Arthroscopic extensive debridement - 29823 Subdeltoid Bursa, Supraspinatus Tendon, Anterior Labrum, and Superior Labrum Arthroscopic distal clavicle excision -  70175 Arthroscopic subacromial decompression - 29826 Arthroscopic biceps tenodesis - 70171 Rationale for Evaluation and Treatment: Rehabilitation  THERAPY DIAG:  Stiffness of right shoulder, not elsewhere classified  Muscle weakness (generalized)  Acute pain of right shoulder  ONSET DATE: DOS 04/25/24   SUBJECTIVE:                                                                                                                                                                                           SUBJECTIVE STATEMENT:   Pt denies pain at entry. States she is now able to raise her arm straight up overhead.     PERTINENT HISTORY:  Dialysis, end-stage  renal disease lupus   PAIN:  Are you having pain? No and Yes: NPRS scale: 0/10 at rest, no greater than a 4/10 pain with movement Pain location: anterior right shoulder Pain description: tight Aggravating factors: spasm Relieving factors: rest   PRECAUTIONS:  Dialysis- shoulder has to be still for 4 hours 3 days/week   RED FLAGS: None   WEIGHT BEARING RESTRICTIONS:  No  FALLS:  Has patient fallen in last 6 months? No   OCCUPATION:  Not working  PLOF:  Independent  PATIENT GOALS:  Have my mobility back  OBJECTIVE:  Note: Objective measures were completed at Evaluation unless otherwise noted.   PATIENT SURVEYS:  UEFS  Extreme difficulty/unable (0), Quite a bit of difficulty (1), Moderate difficulty (2), Little difficulty (3), No difficulty (4) Survey date:  10/3 EVAL 11/4 11/18  Any of your usual work, household or school activities 0 1 1  2. Your usual hobbies, recreational/sport activities 0 1 3   3. Lifting a bag of groceries to waist level 0 1 1   4. Lifting a bag of groceries above your head 0 0 0  5. Grooming your hair 0 2 2  6. Pushing up on your hands (I.e. from bathtub or chair) 0 1 1  7. Preparing food (I.e. peeling/cutting) 0 2 1  8. Driving  0 1 1  9. Vacuuming, sweeping, or raking 0 1 2  10. Dressing  1 1 4   11. Doing up buttons 3 4 1   12. Using tools/appliances 0 1 1  13. Opening doors 0 1 4  14. Cleaning  0 1 1  15. Tying or lacing shoes 1 4 4   16. Sleeping  2 2 1   17. Laundering clothes (I.e. washing, ironing, folding) 0 2 3  18. Opening a jar 1 1 3   19. Throwing a ball 0 1 1  20. Carrying a small suitcase with your affected limb.  0 0 0  Score total:  8/80 28/80 34/80     POSTURE:  Eval: Rounded shoulder in sling as expected post op  HAND DOMINANCE:  Right   Body Part #1 Shoulder  PALPATION: EVAL: no s/s of infection  UPPER EXTREMITY ROM:  Passive ROM Right eval A/AAROM 11/4 Right R Passive 11/18  Shoulder flexion 50 in  pendulum 65/ 140 on finger ladder 153  Shoulder extension     Shoulder abduction  50 143  Shoulder adduction     Shoulder extension     Shoulder internal rotation   51  Shoulder external rotation   75  Elbow flexion WFL    Elbow extension -10 0 -5  Wrist  WFL       (Blank rows = not tested)   UPPER EXTREMITY MMT:  MMT Right 11/18 Left 11/18  Shoulder flexion 3 4+  Shoulder extension    Shoulder abduction 3- 4-  Shoulder adduction    Shoulder extension    Shoulder internal rotation 4+ 5  Shoulder external rotation 4+ 5  Middle trapezius    Lower trapezius    Elbow flexion 4+ 4+  Elbow extension 4 4+  Wrist flexion    Wrist extension    Wrist ulnar deviation    Wrist radial deviation    Wrist pronation    Wrist supination    Grip strength     (Blank rows = not tested)     TREATMENT DATE:   07/04/24 Pulleys each flexion and abduction Wall slide flexion with lift off at top x10 Standing press out x10 0# Theraband row GTB x30 Chicken wing in standing attempts x10 (some discomfort) Supine active flexion 1# x10 PROM R shoulder all planes S/l ER 1# 2x10 S/l chicken wing x20 Bent over row 3# 2x10 Abduction isometric 5 2x10 Standing bil ER 2x10     06/27/24 Pulleys each flexion and extension Standing wand flexion (modified) ~10 reps Finger ladder with lift off at top x10 Standing press out x10 with 1#, x10 0# Theraband row GTB x30 Chicken wing in standing attempts x5 (unable) Supine active flexion 1# x10 (difficult) PROM R shoulder all planes S/l ER 1# 2x10 (0# last 5 reps) HEP review    06/21/24 Pulleys in flexion and scaption Pt received R shoulder PROM in flexion, scaption, ER, and IR per pt and tissue tolerance.  Supine flexion AROM/AAROM x 10 reps, AROM x 10 reps Attempted wand flexion in a reclined position Supine flexion AROM with head elevated 2x10 S/L ER 2x10 S/L shoulder abduction 2x10 Shoulder submax isometric with 5 sec hold    PATIENT EDUCATION:  Education details: Anatomy of condition, POC, HEP, exercise form/rationale Person educated: Patient Education method: Explanation, Demonstration, Tactile cues, Verbal cues, and Handouts Education comprehension: verbalized understanding, returned demonstration, verbal cues required, tactile cues required, and needs further education   HOME EXERCISE PROGRAM: Access Code: [email or web address] URL: [email or web address] Date: 04/28/2024 Prepared by: Harlene Cordon    ASSESSMENT:  CLINICAL IMPRESSION:  Improving with strength against gravity, though still lacking functional level. Continued to work on strengthening and therACt interventions in clinic. She had overall good tolerance, but did c//o mild discomfort with certain tasks. Instructed pt to avoid pushing past pain levels. Reviewed healing timeline and restrictions.  Pt will benefit from continued PT to improve strength for functional tasks.   PERSONAL FACTORS: Patient is currently on dialysis 3 days/week are also affecting patient's functional outcome.   REHAB POTENTIAL: Good  CLINICAL DECISION MAKING: Stable/uncomplicated  EVALUATION COMPLEXITY: Low  GOALS: Goals reviewed with patient? Yes  SHORT TERM GOALS: Target date: 05/13/24  Patient will demonstrate full active range of motion of elbow without discomfort and without assistance Baseline: Goal status: MET  2.  Demonstrate full passive range of motion flexion as well as active assisted range of motion on pulleys without increase in pain Baseline:  Goal status: MET  3.  Pain to stay below 5 out of 10 with daily activities Baseline:  Goal status: MET  4.  Independent in self-care as mom will be leaving Baseline:  Goal status: met- can perform self care but has 54 yo daughter who is autistic and requires care    LONG TERM GOALS: Target date: POC date  Full active range of motion of shoulder without compensatory patterns and  without impingement Baseline:  Goal status: IN PROGRESS 11/18  2.  UEFS to improve by MDC x 3 Baseline:  Goal status: IN PROGRESS 11/18  3.  Gross shoulder strength to 85% of the opposite upper extremity Baseline:  Goal status: IN PROGRESS 11/18  4.  Independent and long-term home exercise program for continued strength and stability Baseline:  Goal status: INITIAL    PLAN:  PT FREQUENCY: 1-2x/week  PT DURATION: Plan of care date  PLANNED INTERVENTIONS: 97164- PT Re-evaluation, 97750- Physical Performance Testing, 97110-Therapeutic exercises, 97530- Therapeutic activity, [id number]- Neuromuscular re-education, 97535- Self Care, 02859- Manual therapy, [phone]- Aquatic Therapy, [phone] (1-2 muscles), 20561 (3+ muscles)- Dry Needling, Patient/Family education, Taping, Joint mobilization, Spinal mobilization, Scar mobilization, and Cryotherapy.  PLAN FOR NEXT SESSION: continue with SCM and clavicular mobs PRN, AROM against gravity.  Cont per protocol.   Asberry Rodes, PTA  07/04/24 1:41 PM

## 2024-07-06 ENCOUNTER — Encounter: Payer: Self-pay | Admitting: Podiatry

## 2024-07-06 ENCOUNTER — Encounter (HOSPITAL_BASED_OUTPATIENT_CLINIC_OR_DEPARTMENT_OTHER): Admitting: Physical Therapy

## 2024-07-06 ENCOUNTER — Ambulatory Visit: Admitting: Podiatry

## 2024-07-06 ENCOUNTER — Ambulatory Visit

## 2024-07-06 DIAGNOSIS — M65971 Unspecified synovitis and tenosynovitis, right ankle and foot: Secondary | ICD-10-CM

## 2024-07-06 DIAGNOSIS — M7752 Other enthesopathy of left foot: Secondary | ICD-10-CM

## 2024-07-06 DIAGNOSIS — M7751 Other enthesopathy of right foot: Secondary | ICD-10-CM

## 2024-07-06 NOTE — Progress Notes (Unsigned)
°  Subjective:  Patient ID: Cindy Luna Cart, female    DOB: 1981/02/22,  MRN: [id number]  No chief complaint on file.   Discussed the use of AI scribe software for clinical note transcription with the patient, who gave verbal consent to proceed.  History of Present Illness Cindy Luna is a 43 year old female on dialysis who presents with right ankle pain and left foot discomfort.  She has had right ankle pain for 3 to 4 weeks, localized to the anterolateral ankle, tender to touch but not painful with walking. She denies injury, warmth, or persistent redness aside from one episode from shoe pressure. Swelling was initially severe but has improved. She has tingling with palpation but no pain with foot movement.  She feels a splitting sensation in the left foot first interspace area with activity and weight bearing with pain in that area and uncertainty if it is skin related. She reports prior cellulitis and swelling in this foot. There are no obvious abnormalities to the left foot and otherwise denies recent injury  She receives hemodialysis Monday, Wednesday, and Friday. Can not tolerate NSAIDs due to ESRD.      Objective:    Physical Exam EXTREMITIES: Pulses intact bilaterally in feet, capillary refill intact to digits.  Pedal skin well-hydrated.  Normal skin texture and skin turgor.   Muscle strength 5/5 in dorsiflexion, plantarflexion inversion and eversion.  No gross abnormalities left foot.  Right ankle localized edema peri malleoli region with some tenderness on palpation anterior lateral aspect.  No gross ankle instability noted. Light touch sensation intact.   No images are attached to the encounter.    Results RADIOLOGY Right ankle radiographs 3 views: No significant abnormalities; ankle joint in good alignment.  Left foot radiographs 3 views weightbearing: Normal osseous mineralization.  No acute fractures.  Slight joint space narrowing of first MPJ with mild bunion  deformity   Assessment:   1. Synovitis and tenosynovitis of right ankle and foot   2. Capsulitis of metatarsophalangeal (MTP) joint of left foot      Plan:  Patient was evaluated and treated and all questions answered.  Assessment and Plan Assessment & Plan Right ankle capsulitis with synovitis Right ankle capsulitis with persistent tenderness, no significant swelling or redness. Differential includes tendinitis or soft tissue inflammation. X-rays normal. Good muscle strength and alignment. Avoided oral steroids due to dialysis. Considered steroid injection if symptoms persist. - Fitted with a compression ankle brace for support and swelling management. - Advised wearing supportive shoes and avoiding walking barefoot. - Recommended icing or warm compresses based on comfort. - Scheduled follow-up in three to four weeks to assess improvement. - Will consider steroid injection if symptoms persist.  Left foot metatarsophalangeal joint capsulitis Left foot metatarsophalangeal joint capsulitis with sensation of skin or muscle splitting. No visible skin abnormalities or neuropathy. Good pulses. Compression therapy recommended. - Advised wearing supportive shoes and avoiding walking barefoot. - Recommended icing or warm compresses based on comfort.      Return in about 4 weeks (around 08/03/2024) for right ankle synovitis.

## 2024-07-07 ENCOUNTER — Ambulatory Visit: Admitting: Family Medicine

## 2024-07-08 ENCOUNTER — Other Ambulatory Visit: Payer: Self-pay

## 2024-07-08 ENCOUNTER — Observation Stay (HOSPITAL_COMMUNITY)
Admission: EM | Admit: 2024-07-08 | Discharge: 2024-07-09 | Disposition: A | Discharge status: Home / Self Care | Attending: Internal Medicine | Admitting: Internal Medicine

## 2024-07-08 ENCOUNTER — Encounter (HOSPITAL_COMMUNITY): Payer: Self-pay

## 2024-07-08 ENCOUNTER — Emergency Department (HOSPITAL_COMMUNITY)

## 2024-07-08 DIAGNOSIS — D631 Anemia in chronic kidney disease: Secondary | ICD-10-CM | POA: Diagnosis not present

## 2024-07-08 DIAGNOSIS — G4733 Obstructive sleep apnea (adult) (pediatric): Secondary | ICD-10-CM | POA: Diagnosis not present

## 2024-07-08 DIAGNOSIS — Z79899 Other long term (current) drug therapy: Secondary | ICD-10-CM | POA: Insufficient documentation

## 2024-07-08 DIAGNOSIS — L930 Discoid lupus erythematosus: Secondary | ICD-10-CM | POA: Diagnosis not present

## 2024-07-08 DIAGNOSIS — Z87891 Personal history of nicotine dependence: Secondary | ICD-10-CM | POA: Insufficient documentation

## 2024-07-08 DIAGNOSIS — J810 Acute pulmonary edema: Secondary | ICD-10-CM | POA: Diagnosis not present

## 2024-07-08 DIAGNOSIS — E872 Acidosis, unspecified: Secondary | ICD-10-CM | POA: Insufficient documentation

## 2024-07-08 DIAGNOSIS — J111 Influenza due to unidentified influenza virus with other respiratory manifestations: Secondary | ICD-10-CM

## 2024-07-08 DIAGNOSIS — I120 Hypertensive chronic kidney disease with stage 5 chronic kidney disease or end stage renal disease: Secondary | ICD-10-CM | POA: Insufficient documentation

## 2024-07-08 DIAGNOSIS — Z992 Dependence on renal dialysis: Secondary | ICD-10-CM | POA: Diagnosis not present

## 2024-07-08 DIAGNOSIS — Z6841 Body Mass Index (BMI) 40.0 and over, adult: Secondary | ICD-10-CM | POA: Insufficient documentation

## 2024-07-08 DIAGNOSIS — J101 Influenza due to other identified influenza virus with other respiratory manifestations: Secondary | ICD-10-CM | POA: Diagnosis not present

## 2024-07-08 DIAGNOSIS — R059 Cough, unspecified: Secondary | ICD-10-CM | POA: Diagnosis present

## 2024-07-08 DIAGNOSIS — N186 End stage renal disease: Secondary | ICD-10-CM | POA: Insufficient documentation

## 2024-07-08 DIAGNOSIS — R0609 Other forms of dyspnea: Secondary | ICD-10-CM

## 2024-07-08 LAB — CBC WITH DIFFERENTIAL/PLATELET
Abs Immature Granulocytes: 0.04 K/uL (ref 0.00–0.07)
Basophils Absolute: 0 K/uL (ref 0.0–0.1)
Basophils Relative: 0 %
Eosinophils Absolute: 0 K/uL (ref 0.0–0.5)
Eosinophils Relative: 0 %
HCT: 33.6 % — ABNORMAL LOW (ref 36.0–46.0)
Hemoglobin: 11 g/dL — ABNORMAL LOW (ref 12.0–15.0)
Immature Granulocytes: 1 %
Lymphocytes Relative: 15 %
Lymphs Abs: 0.8 K/uL (ref 0.7–4.0)
MCH: 30.7 pg (ref 26.0–34.0)
MCHC: 32.7 g/dL (ref 30.0–36.0)
MCV: 93.9 fL (ref 80.0–100.0)
Monocytes Absolute: 1 K/uL (ref 0.1–1.0)
Monocytes Relative: 17 %
Neutro Abs: 3.7 K/uL (ref 1.7–7.7)
Neutrophils Relative %: 67 %
Platelets: 165 K/uL (ref 150–400)
RBC: 3.58 MIL/uL — ABNORMAL LOW (ref 3.87–5.11)
RDW: 14.8 % (ref 11.5–15.5)
WBC: 5.6 K/uL (ref 4.0–10.5)
nRBC: 0 % (ref 0.0–0.2)

## 2024-07-08 LAB — COMPREHENSIVE METABOLIC PANEL WITH GFR
ALT: 16 U/L (ref 0–44)
AST: 10 U/L — ABNORMAL LOW (ref 15–41)
Albumin: 3.7 g/dL (ref 3.5–5.0)
Alkaline Phosphatase: 138 U/L — ABNORMAL HIGH (ref 38–126)
Anion gap: 17 — ABNORMAL HIGH (ref 5–15)
BUN: 53 mg/dL — ABNORMAL HIGH (ref 6–20)
CO2: 19 mmol/L — ABNORMAL LOW (ref 22–32)
Calcium: 7.8 mg/dL — ABNORMAL LOW (ref 8.9–10.3)
Chloride: 100 mmol/L (ref 98–111)
Creatinine, Ser: 16.58 mg/dL — ABNORMAL HIGH (ref 0.44–1.00)
GFR, Estimated: 2 mL/min — ABNORMAL LOW (ref >60)
Glucose, Bld: 92 mg/dL (ref 70–99)
Potassium: 5.1 mmol/L (ref 3.5–5.1)
Sodium: 136 mmol/L (ref 135–145)
Total Bilirubin: 1.3 mg/dL — ABNORMAL HIGH (ref 0.0–1.2)
Total Protein: 7.7 g/dL (ref 6.5–8.1)

## 2024-07-08 LAB — TROPONIN I (HIGH SENSITIVITY)
Troponin I (High Sensitivity): 11 ng/L (ref <18)
Troponin I (High Sensitivity): 13 ng/L (ref <18)

## 2024-07-08 LAB — I-STAT CHEM 8, ED
BUN: 57 mg/dL — ABNORMAL HIGH (ref 6–20)
Calcium, Ion: 0.91 mmol/L — ABNORMAL LOW (ref 1.15–1.40)
Chloride: 102 mmol/L (ref 98–111)
Creatinine, Ser: 17.2 mg/dL — ABNORMAL HIGH (ref 0.44–1.00)
Glucose, Bld: 91 mg/dL (ref 70–99)
HCT: 34 % — ABNORMAL LOW (ref 36.0–46.0)
Hemoglobin: 11.6 g/dL — ABNORMAL LOW (ref 12.0–15.0)
Potassium: 5.4 mmol/L — ABNORMAL HIGH (ref 3.5–5.1)
Sodium: 137 mmol/L (ref 135–145)
TCO2: 23 mmol/L (ref 22–32)

## 2024-07-08 LAB — HCG, SERUM, QUALITATIVE: Preg, Serum: NEGATIVE

## 2024-07-08 LAB — HIV ANTIBODY (ROUTINE TESTING W REFLEX): HIV Screen 4th Generation wRfx: NONREACTIVE

## 2024-07-08 LAB — RESP PANEL BY RT-PCR (RSV, FLU A&B, COVID)  RVPGX2
Influenza A by PCR: POSITIVE — AB
Influenza B by PCR: NEGATIVE
Resp Syncytial Virus by PCR: NEGATIVE
SARS Coronavirus 2 by RT PCR: NEGATIVE

## 2024-07-08 LAB — LIPASE, BLOOD: Lipase: 48 U/L (ref 11–51)

## 2024-07-08 LAB — HEPATITIS B SURFACE ANTIGEN: Hepatitis B Surface Ag: NONREACTIVE

## 2024-07-08 MED ORDER — HEPARIN SODIUM (PORCINE) 5000 UNIT/ML IJ SOLN
5000.0000 [IU] | Freq: Three times a day (TID) | INTRAMUSCULAR | Status: DC
  Administered 2024-07-08 – 2024-07-09 (×2): 5000 [IU] via SUBCUTANEOUS
  Filled 2024-07-08 (×2): qty 1

## 2024-07-08 MED ORDER — ACETAMINOPHEN 325 MG PO TABS
650.0000 mg | ORAL_TABLET | Freq: Four times a day (QID) | ORAL | Status: DC | PRN
  Administered 2024-07-08 – 2024-07-09 (×2): 650 mg via ORAL
  Filled 2024-07-08 (×2): qty 2

## 2024-07-08 MED ORDER — IPRATROPIUM-ALBUTEROL 0.5-2.5 (3) MG/3ML IN SOLN
3.0000 mL | RESPIRATORY_TRACT | Status: DC | PRN
  Administered 2024-07-09: 3 mL via RESPIRATORY_TRACT
  Filled 2024-07-08: qty 3

## 2024-07-08 MED ORDER — PHENOL 1.4 % MT LIQD
1.0000 | OROMUCOSAL | Status: DC | PRN
  Filled 2024-07-08: qty 177

## 2024-07-08 MED ORDER — ACETAMINOPHEN 650 MG RE SUPP: 650.0000 mg | Freq: Four times a day (QID) | RECTAL | Status: DC | PRN

## 2024-07-08 MED ORDER — ONDANSETRON HCL 4 MG/2ML IJ SOLN: 4.0000 mg | Freq: Four times a day (QID) | INTRAMUSCULAR | Status: DC | PRN

## 2024-07-08 MED ORDER — SODIUM CHLORIDE 0.9 % IV BOLUS: 250.0000 mL | Freq: Once | INTRAVENOUS | Status: DC

## 2024-07-08 MED ORDER — CHLORHEXIDINE GLUCONATE CLOTH 2 % EX PADS
6.0000 | MEDICATED_PAD | Freq: Every day | CUTANEOUS | Status: DC
  Administered 2024-07-09: 6 via TOPICAL

## 2024-07-08 MED ORDER — ONDANSETRON HCL 4 MG PO TABS: 4.0000 mg | ORAL_TABLET | Freq: Four times a day (QID) | ORAL | Status: DC | PRN

## 2024-07-08 MED ORDER — OSELTAMIVIR PHOSPHATE 30 MG PO CAPS: 30.0000 mg | ORAL_CAPSULE | ORAL | Status: DC

## 2024-07-08 MED ORDER — IPRATROPIUM-ALBUTEROL 0.5-2.5 (3) MG/3ML IN SOLN
3.0000 mL | Freq: Once | RESPIRATORY_TRACT | Status: AC
  Administered 2024-07-08: 3 mL via RESPIRATORY_TRACT
  Filled 2024-07-08: qty 3

## 2024-07-08 MED ORDER — ALBUTEROL SULFATE (2.5 MG/3ML) 0.083% IN NEBU
2.5000 mg | INHALATION_SOLUTION | Freq: Four times a day (QID) | RESPIRATORY_TRACT | Status: DC
  Administered 2024-07-08 – 2024-07-09 (×2): 2.5 mg via RESPIRATORY_TRACT
  Filled 2024-07-08 (×2): qty 3

## 2024-07-08 MED ORDER — HYDRALAZINE HCL 20 MG/ML IJ SOLN: 10.0000 mg | Freq: Four times a day (QID) | INTRAMUSCULAR | Status: DC | PRN

## 2024-07-08 MED ORDER — OSELTAMIVIR PHOSPHATE 30 MG PO CAPS
30.0000 mg | ORAL_CAPSULE | Freq: Once | ORAL | Status: AC
  Administered 2024-07-08: 30 mg via ORAL
  Filled 2024-07-08: qty 1

## 2024-07-08 NOTE — Progress Notes (Signed)
 Received consult on behalf of pt. Pt states she originally had fistula on L upper arm but it closed off and then had one placed on RUE. Pt states her dialysis center only uses RUE fistula. Pt A&Ox4. Pt states previous PIV's have been placed in proximal, upper L arm only. She states she does not want to be stuck more than once and that is the only vein that consistently works. 22g placed in preferred vein successfully. Dark red blood return. Pt denies complaints after insertion. RN notified. 20cc of blood given to phlebotomy.

## 2024-07-08 NOTE — Consult Note (Signed)
 Renal Service Consult Note [facility] Lamar JONETTA Fret, MD  Patient: Cindy Luna Date: 07/08/2024 Requesting Physician: Dr. Simon  Reason for Consult: ESRD pt w/  HPI: The patient is a 43 y.o. year-old w/ PMH as below who presented to ED c/o SOB x 4 days, diarrhea for 1 week. EMS reported wheezing but SpO2 100%, temp of 101 deg. Pt thought similar to PNA in the past. Missed HD yesterday (MWF). In ED BP 150/60, HR 101, RR 19, temp 98. 100% on RA. K+ 5.2, bun 53, creat 16.5. alb 3.7, ca 7.8, Hb 11.6. +flu by PCR. CXR showed basilar atx vs infection. Pt rec'd tamiful in ED, duonebs. Pt was admitted. We are asked to see for dialysis.    Pt seen in ED room. Pt goes to [facility] MWF HD. 4 hrs, does get heparin  (6K going on and 6K midrun). Has RUA AVF. Has been sick for about 2 days w/ flu-like symptoms. No CP. Missed HD yesterday.    ROS - denies CP, no joint pain, no HA, no blurry vision, no rash, no diarrhea, no nausea/ vomiting   Past Medical History  Past Medical History:  Diagnosis Date   Allergy    Anemia    Anxiety    Depression    DUB (dysfunctional uterine bleeding) 06/24/2016   End stage renal disease (HCC)    Dialysis Mon-Wed-Fri   Fatty liver    GERD (gastroesophageal reflux disease)    HLD (hyperlipidemia)    HTN (hypertension) 2007   with pregnancy but resolved after delivery   Lupus    Osteoarthritis    shoulders   Ovarian cyst 09/01/2016   Peripheral vascular disease    Pneumonia    x 1   Renal disorder    Sleep apnea    Past Surgical History  Past Surgical History:  Procedure Laterality Date   AV FISTULA PLACEMENT  2016 right, 2009 left   left and right are both working    BICEPT TENODESIS Right 04/25/2024   Procedure: TENODESIS, BICEPS;  Surgeon: Genelle Standing, MD;  Location: [facility] OR;  Service: Orthopedics;  Laterality: Right;   CESAREAN SECTION     x 1   COLONOSCOPY     OTHER SURGICAL HISTORY Left    AV fistula stents, arm   POSTERIOR  LUMBAR FUSION 2 WITH HARDWARE REMOVAL Right 04/25/2024   Procedure: ARTHROSCOPY, SHOULDER WITH DEBRIDEMENT;  Surgeon: Genelle Standing, MD;  Location: [facility] OR;  Service: Orthopedics;  Laterality: Right;  RIGHT SHOULDER ARTHROSCOPY WITH DISTAL CLAVICLE RESECTION, ACROMIOPLASTY, BICEPS TENODESIS   SHOULDER INJECTION Left 04/25/2024   Procedure: INJECTION, SHOULDER;  Surgeon: Genelle Standing, MD;  Location: [facility] OR;  Service: Orthopedics;  Laterality: Left;  LEFT GLENOHUMERAL INJECTION   TUBAL LIGATION  2010   UPPER GI ENDOSCOPY     Family History  Family History  Problem Relation Age of Onset   Drug abuse Father    Early death Father    Hypertension Mother    Diabetes Mother    Autism Daughter    ADD / ADHD Daughter    Learning disabilities Daughter    Thyroid  disease Sister    Colon cancer Maternal Grandmother    Cancer Maternal Grandmother    Prostate cancer Maternal Grandfather    Cancer Maternal Grandfather    Stroke Maternal Grandfather    Breast cancer Maternal Aunt    Hypertension Maternal Aunt    Diabetes Other        both sides  of the fam   Hypertension Other        both sides of the fam   Alcohol abuse Maternal Uncle    Cancer Maternal Aunt    Depression Maternal Aunt    Diabetes Maternal Aunt    Diabetes Paternal Uncle    Hypertension Paternal Uncle    Social History  reports that she quit smoking about 5 years ago. Her smoking use included cigars. She started smoking about 7 years ago. She has never used smokeless tobacco. She reports that she does not drink alcohol and does not use drugs. Allergies Allergies[1] Home medications Prior to Admission medications  Medication Sig Start Date End Date Taking? Authorizing Provider  cetirizine (ZYRTEC) 10 MG tablet Take 10 mg by mouth daily.    [provider]  cinacalcet (SENSIPAR) 90 MG tablet Take 120 mg by mouth daily. Takes two 60 mg tabs daily    [provider]  docusate sodium (COLACE) 100 MG capsule  Take 300 mg by mouth daily.    [provider]  famotidine  (PEPCID ) 20 MG tablet Take 1 tablet (20 mg total) by mouth daily as directed for heartburn. Patient taking differently: Take 20 mg by mouth at bedtime. 01/13/23     famotidine  (PEPCID ) 20 MG tablet Take 1 tablet (20 mg total) by mouth daily as directed for heartburn. Patient taking differently: Take 20 mg by mouth at bedtime. 02/14/24     linaclotide  (LINZESS ) 145 MCG CAPS capsule Take 1 capsule (145 mcg total) by mouth daily as needed. 02/14/24     norethindrone (AYGESTIN) 5 MG tablet Take 1 tablet by mouth daily. 11/23/19   [provider]  norethindrone (AYGESTIN) 5 MG tablet Take 5 mg by mouth. 01/05/20   [provider]  oxyCODONE  (ROXICODONE ) 5 MG immediate release tablet Take 1 tablet (5 mg total) by mouth every 4 (four) hours as needed for severe pain (pain score 7-10) or breakthrough pain. 04/25/24   Genelle Standing, MD  sevelamer carbonate (RENVELA) 800 MG tablet Take 800 mg by mouth. Take 5 tablets three times daily with meal and 2 tablets two times with snack    [provider]  SUPER B COMPLEX/C PO  03/17/21   [provider]  triamcinolone  cream (KENALOG ) 0.1 % Apply 1 application topically 2 (two) times daily. 02/25/21   Johnny Garnette LABOR, MD  Zinc  50 MG TABS  02/04/21   [provider]     Vitals:   07/08/24 1434 07/08/24 1438 07/08/24 1440 07/08/24 1451  BP:   (!) 149/59   Pulse: 100  (!) 101   Resp:   18   Temp:   98.5 F (36.9 C)   SpO2:  100% 100%   Weight:    131 kg  Height:    5' 8 (1.727 m)   Exam Gen alert, no distress, obese, pleasant Sclera anicteric, throat clear  No jvd or bruits Chest clear bilat to bases RRR no MRG Abd soft ntnd no mass or ascites +bs Ext no LE or UE edema, no other edema Neuro is alert, Ox 3 , nf    RUA AVF+bruit   Home bp meds: None    OP HD: MWF [facility]  4h   AVF  Heparin  6000+ 6000 (per pt) Missed HD Friday  Today's labs -> K+  5.4, bun 57, creat 16, CO2 19, alb 3.7, Hb 11.6 Flu A+ on PCR CXR - looks negative, no edema   Assessment/ Plan: SOB/ flu+PNA/  wheezing: per EMS pt wheezing but SpO2 sats were 100%. SpO2 here is also 100% on RA. CXR shows no edema nor obvious PNA, possibly basilar process on the L.  ESRD: on HD MWF. Missed HD Friday. K+ up slightly. Plan HD tonight.  Volume: UF 2.5- 3 L as tolerated. Not on O2 here, CXR w/o edema.  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        Myer Fret  MD [facility] 07/08/2024, 5:54 PM  Recent Labs  Lab 07/08/24 1458 07/08/24 1634  HGB  --  11.6*  ALBUMIN 3.7  --   CALCIUM 7.8*  --   CREATININE 16.58* 17.20*  K 5.1 5.4*   Inpatient medications:  oseltamivir   30 mg Oral Once          [1]  Allergies Allergen Reactions   Azithromycin Hives   Benadryl [Diphenhydramine Hcl] Hives   Clindamycin /Lincomycin Itching    Redness   Doxycycline Hyclate    Vancomycin Swelling   Adhesive [Tape] Rash

## 2024-07-08 NOTE — ED Triage Notes (Addendum)
 Patient bib [facility] from home with complaints of shob for the past 4 days She also has a sore throat and has had diarrhea for a week with bright red blood. It has been progressivley getting worse. EMS reports wheezing but oxygen was 100%. EMS reports a fever of 101. Patient feels like this was similar to pneumonia in the past.

## 2024-07-08 NOTE — Plan of Care (Signed)

## 2024-07-08 NOTE — ED Provider Notes (Signed)
 [place] [facility] AT [facility] Provider Note   CSN: [id number] Arrival date & time: 07/08/24  1430     Patient presents with: Shortness of Breath and Cough   Cindy Luna is a 43 y.o. female.   HPI    Patient presents because of shortness of breath.  Has been having a cough, chills.  Endorses some chest tightness midsternal in nature.  No obvious pleuritic chest pain or hemoptysis.  No history of DVT or PE.  No unilateral leg swelling.  Patient states that she has been having diarrhea.  Patient states she has had some minimal bright red blood in her stool that she states sometimes happens in the setting of her internal hemorrhoids.  Patient states her biggest complaint today is her shortness of breath with cough.  No sick contacts that she is aware of.  Chills.  No documented fevers that she is aware of.  No exertional chest pain.  Denies any ACS history.  No history of DVT or PE. Patient states that she is on dialysis Monday Wednesday Friday.  Did not go to dialysis on Friday because of illness.  Previous medical history reviewed : Patient follows up with pulmonology in the setting of sleep apnea.  Lupus nephritis with end-stage renal disease.  GERD, hepatic steatosis, hypertension,   Prior to Admission medications  Medication Sig Start Date End Date Taking? Authorizing Provider  cetirizine (ZYRTEC) 10 MG tablet Take 10 mg by mouth daily.    [provider]  cinacalcet (SENSIPAR) 90 MG tablet Take 120 mg by mouth daily. Takes two 60 mg tabs daily    [provider]  docusate sodium (COLACE) 100 MG capsule Take 300 mg by mouth daily.    [provider]  famotidine  (PEPCID ) 20 MG tablet Take 1 tablet (20 mg total) by mouth daily as directed for heartburn. Patient taking differently: Take 20 mg by mouth at bedtime. 01/13/23     famotidine  (PEPCID ) 20 MG tablet Take 1 tablet (20 mg total) by mouth daily as directed for  heartburn. Patient taking differently: Take 20 mg by mouth at bedtime. 02/14/24     linaclotide  (LINZESS ) 145 MCG CAPS capsule Take 1 capsule (145 mcg total) by mouth daily as needed. 02/14/24     norethindrone (AYGESTIN) 5 MG tablet Take 1 tablet by mouth daily. 11/23/19   [provider]  norethindrone (AYGESTIN) 5 MG tablet Take 5 mg by mouth. 01/05/20   [provider]  oxyCODONE  (ROXICODONE ) 5 MG immediate release tablet Take 1 tablet (5 mg total) by mouth every 4 (four) hours as needed for severe pain (pain score 7-10) or breakthrough pain. 04/25/24   Genelle Standing, MD  sevelamer carbonate (RENVELA) 800 MG tablet Take 800 mg by mouth. Take 5 tablets three times daily with meal and 2 tablets two times with snack    [provider]  SUPER B COMPLEX/C PO  03/17/21   [provider]  triamcinolone  cream (KENALOG ) 0.1 % Apply 1 application topically 2 (two) times daily. 02/25/21   Johnny Garnette LABOR, MD  Zinc  50 MG TABS  02/04/21   [provider]    Allergies: Azithromycin, Benadryl [diphenhydramine hcl], Clindamycin /lincomycin, Doxycycline hyclate, Vancomycin, and Adhesive [tape]    Review of Systems  Constitutional:  Negative for chills and fever.  HENT:  Negative for ear pain and sore throat.   Eyes:  Negative for pain and visual disturbance.  Respiratory:  Negative for cough and shortness  of breath.   Cardiovascular:  Negative for chest pain and palpitations.  Gastrointestinal:  Negative for abdominal pain and vomiting.  Genitourinary:  Negative for dysuria and hematuria.  Musculoskeletal:  Negative for arthralgias and back pain.  Skin:  Negative for color change and rash.  Neurological:  Negative for seizures and syncope.  All other systems reviewed and are negative.   Updated Vital Signs BP (!) 138/52 (BP Location: Left Leg)   Pulse (!) 116   Temp 100.2 F (37.9 C) (Oral)   Resp (!) 21   Ht 5' 8 (1.727 m)   Wt 131 kg   SpO2 100%   BMI  43.91 kg/m   Physical Exam Vitals and nursing note reviewed.  Constitutional:      General: She is not in acute distress.    Appearance: She is well-developed.  HENT:     Head: Normocephalic and atraumatic.  Eyes:     Conjunctiva/sclera: Conjunctivae normal.  Cardiovascular:     Rate and Rhythm: Normal rate and regular rhythm.     Heart sounds: No murmur heard. Pulmonary:     Effort: Pulmonary effort is normal. No respiratory distress.     Breath sounds: Wheezing present.  Abdominal:     Palpations: Abdomen is soft.     Tenderness: There is no abdominal tenderness.  Musculoskeletal:        General: No swelling.     Cervical back: Neck supple.  Skin:    General: Skin is warm and dry.     Capillary Refill: Capillary refill takes less than 2 seconds.  Neurological:     Mental Status: She is alert.  Psychiatric:        Mood and Affect: Mood normal.     (all labs ordered are listed, but only abnormal results are displayed) Labs Reviewed  RESP PANEL BY RT-PCR (RSV, FLU A&B, COVID)  RVPGX2 - Abnormal; Notable for the following components:      Result Value   Influenza A by PCR POSITIVE (*)    All other components within normal limits  COMPREHENSIVE METABOLIC PANEL WITH GFR - Abnormal; Notable for the following components:   CO2 19 (*)    BUN 53 (*)    Creatinine, Ser 16.58 (*)    Calcium 7.8 (*)    AST 10 (*)    Alkaline Phosphatase 138 (*)    Total Bilirubin 1.3 (*)    GFR, Estimated 2 (*)    Anion gap 17 (*)    All other components within normal limits  CBC WITH DIFFERENTIAL/PLATELET - Abnormal; Notable for the following components:   RBC 3.58 (*)    Hemoglobin 11.0 (*)    HCT 33.6 (*)    All other components within normal limits  I-STAT CHEM 8, ED - Abnormal; Notable for the following components:   Potassium 5.4 (*)    BUN 57 (*)    Creatinine, Ser 17.20 (*)    Calcium, Ion 0.91 (*)    Hemoglobin 11.6 (*)    HCT 34.0 (*)    All other components within normal  limits  HCG, SERUM, QUALITATIVE  LIPASE, BLOOD  CBC WITH DIFFERENTIAL/PLATELET  CBC  COMPREHENSIVE METABOLIC PANEL WITH GFR  CBC  HEPATITIS B SURFACE ANTIBODY, QUANTITATIVE  HEPATITIS B SURFACE ANTIGEN  HIV ANTIBODY (ROUTINE TESTING W REFLEX)  TROPONIN I (HIGH SENSITIVITY)  TROPONIN I (HIGH SENSITIVITY)    EKG: None  Radiology: DG Chest Portable 1 View Result Date: 07/08/2024 CLINICAL DATA:  SOB  EXAM: PORTABLE CHEST 1 VIEW COMPARISON:  October 01, 2017, January 23, 2024 FINDINGS: Evaluation is limited by positioning. The cardiomediastinal silhouette is unchanged in contour. Similar perihilar vascular prominence. LEFT-sided stents. No pleural effusion. No pneumothorax. Increased bibasilar reticulonodular opacities. IMPRESSION: Increased bibasilar reticulonodular opacities. Differential considerations include atelectasis, edema or infection. Electronically Signed   By: Corean Salter M.D.   On: 07/08/2024 15:46     Procedures   Medications Ordered in the ED  heparin  injection 5,000 Units (has no administration in time range)  acetaminophen  (TYLENOL ) tablet 650 mg (650 mg Oral Given 07/08/24 2017)    Or  acetaminophen  (TYLENOL ) suppository 650 mg ( Rectal See Alternative 07/08/24 2017)  ondansetron  (ZOFRAN ) tablet 4 mg (has no administration in time range)    Or  ondansetron  (ZOFRAN ) injection 4 mg (has no administration in time range)  albuterol  (PROVENTIL ) (2.5 MG/3ML) 0.083% nebulizer solution 2.5 mg (2.5 mg Nebulization Given 07/08/24 1949)  Chlorhexidine  Gluconate Cloth 2 % PADS 6 each (has no administration in time range)  ipratropium-albuterol  (DUONEB) 0.5-2.5 (3) MG/3ML nebulizer solution 3 mL (has no administration in time range)  oseltamivir  (TAMIFLU ) capsule 30 mg (has no administration in time range)  phenol (CHLORASEPTIC) mouth spray 1 spray (has no administration in time range)  ipratropium-albuterol  (DUONEB) 0.5-2.5 (3) MG/3ML nebulizer solution 3 mL (3 mLs  Nebulization Given 07/08/24 1632)  oseltamivir  (TAMIFLU ) capsule 30 mg (30 mg Oral Given 07/08/24 1825)                                    Medical Decision Making Amount and/or Complexity of Data Reviewed Labs: ordered. Radiology: ordered.  Risk Prescription drug management. Decision regarding hospitalization.     HPI:     Patient presents because of shortness of breath.  Has been having a cough, chills.  Endorses some chest tightness midsternal in nature.  No obvious pleuritic chest pain or hemoptysis.  No history of DVT or PE.  No unilateral leg swelling.  Patient states that she has been having diarrhea.  Patient states she has had some minimal bright red blood in her stool that she states sometimes happens in the setting of her internal hemorrhoids.  Patient states her biggest complaint today is her shortness of breath with cough.  No sick contacts that she is aware of.  Chills.  No documented fevers that she is aware of.  No exertional chest pain.  Denies any ACS history.  No history of DVT or PE.  Patient states that she is on dialysis Monday Wednesday Friday.  Did not go to dialysis on Friday because of illness.  Previous medical history reviewed : Patient follows up with pulmonology in the setting of sleep apnea.  Lupus nephritis with end-stage renal disease.  GERD, hepatic steatosis, hypertension,    MDM:   Upon exam, patient ANO x 3 with GCS 15.  O2 sat 100% on room air.  Tachycardia up to 100.    Differential diagnosis includes viral upper story infection versus pneumonia versus bronchitis versus PE versus ACS.  Obtain basic laboratory workup.  Missed dialysis so need to obtain labs to make sure she is not hyperkalemic.  Looking at EKG does not show any kind of QRS prolongation or peaked T waves this point time.  Follow.   Obtain COVID RSV and flu testing.  Chest x-ray to rule out any pneumonia.  Endorsing some bright red blood in  her stool that she states is  consistent for her internal hemorrhoids.  Will obtain CBC to make sure there is no significant anemia.  Soft and benign abdomen this time.  No rebound guarding or tenderness  Reevaluation:   Chest x-ray shows either some infiltrates versus pulmonary edema.  Patient tested positive for influenza.  I think given that she missed her dialysis, I feel like some of the pathology that was seen in her chest x-ray is likely because of some small effusions.  I will cover her with Tamiflu  30 mg given that she is on dialysis and lupus.  Do not think she needs coverage for an community-acquired pneumonia at this time but if her symptoms change or she worsens then maybe we will consider starting her on commune acquired pneumonia coverage.  Considered D-dimer but I think patient's presentation is more consistent for viral illness given that she is having systemic symptoms such as cough, chills, nausea, vomiting, diarrhea.  I did reach out to nephrology.  They will see the patient for dialysis.  No significant electrolyte disarray that would require emergent dialysis at this time.  Patient will be admitted in the setting of missed dialysis as well as increased work of breathing from influenza.  When signing this note.  I realized that patient CBC was still in process.  Called down the lab.  Sounds like there was an error in regards to the laboratory running her CBC.  Subsequently he had his redraw CBC.  Nursing staff obtained the CBC.  No significant anemia.  Interventions: duoneb   EKG Interpreted by Me: sinus    Cardiac Tele Interpreted by Me: sinus    I have independently interpreted the CXR   images and agree with the radiologist finding   Social Determinant of Health: Denies current tobacco abuse    Disposition and Follow Up: admit       Final diagnoses:  Influenza  Acute pulmonary edema (HCC)  Dyspnea on exertion    ED Discharge Orders     None          Simon Lavonia SAILOR,  MD 07/08/24 2034

## 2024-07-08 NOTE — ED Triage Notes (Signed)
 Patient is on dialysis but missed treatment yesterday due to feeling bad.

## 2024-07-08 NOTE — H&P (Addendum)
 History and Physical    [name] FMW:[id number] DOB: August 10, 1980 DOA: 07/08/2024  PCP: Cindy Clotilda SAUNDERS, MD  Patient coming from: Home  I have personally briefly reviewed patient's old medical records in [facility]  Chief Complaint: Chest tightness  HPI: Cindy Luna is a 43 y.o. female with medical history significant of lupus, hyperlipidemia, GERD, obesity, OSA on CPAP, ESRD on dialysis (MWF) presented here with chest tightness, shortness of breath, fever, headache, body ache, diarrhea since Thursday.  She started feeling bad on Thursday.  She missed her dialysis yesterday since she was feeling bad.  No recent sick contact.  Reports that she was short of breath earlier this morning when she called EMS however now her breathing have improved.  Patient reports that she had been compliant with her dialysis appointments otherwise.  She denies chest pain, blurry vision, abdominal pain, nausea, vomiting or melena.  She has sleep apnea and have been compliant with CPAP however last night she was unable to keep her CPAP on due to the difficulty breathing.  Lives with her daughter at home.  She denies history of tobacco abuse, alcohol abuse, illicit drug use.  ED Course: Upon arrival to ED: Patient afebrile, with temperature of 98.5, pulse of 100, RR: 18, BP 149/59, maintaining oxygen saturation on room air.  Workup shows NA 136, K: 5.1, bicarb 19, BUN 53, creatinine 16.58, GFR 2, anion gap 17, total bili: 1.3, ALP: 138, CBC shows H&H of 11.0/33.6.  Troponin negative, lipase: WNL, respiratory panel positive for flu A.  Chest x-ray shows increased bibasilar reticulonodular opacities, differential considerations include atelectasis, edema or infection. Patient was given Tamiflu  in the ER and DuoNebs.  EDP consulted nephrology.  [facility] hospitalist consulted for admission.   Review of Systems: As per HPI otherwise negative.    Past Medical History:  Diagnosis Date   Allergy    Anemia     Anxiety    Depression    DUB (dysfunctional uterine bleeding) 06/24/2016   End stage renal disease (HCC)    Dialysis Mon-Wed-Fri   Fatty liver    GERD (gastroesophageal reflux disease)    HLD (hyperlipidemia)    HTN (hypertension) 2007   with pregnancy but resolved after delivery   Lupus    Osteoarthritis    shoulders   Ovarian cyst 09/01/2016   Peripheral vascular disease    Pneumonia    x 1   Sleep apnea     Past Surgical History:  Procedure Laterality Date   AV FISTULA PLACEMENT  2016 right, 2009 left   left and right are both working    BICEPT TENODESIS Right 04/25/2024   Procedure: TENODESIS, BICEPS;  Surgeon: Genelle Standing, MD;  Location: [facility] OR;  Service: Orthopedics;  Laterality: Right;   CESAREAN SECTION     x 1   COLONOSCOPY     OTHER SURGICAL HISTORY Left    AV fistula stents, arm   POSTERIOR LUMBAR FUSION 2 WITH HARDWARE REMOVAL Right 04/25/2024   Procedure: ARTHROSCOPY, SHOULDER WITH DEBRIDEMENT;  Surgeon: Genelle Standing, MD;  Location: [facility] OR;  Service: Orthopedics;  Laterality: Right;  RIGHT SHOULDER ARTHROSCOPY WITH DISTAL CLAVICLE RESECTION, ACROMIOPLASTY, BICEPS TENODESIS   SHOULDER INJECTION Left 04/25/2024   Procedure: INJECTION, SHOULDER;  Surgeon: Genelle Standing, MD;  Location: [facility] OR;  Service: Orthopedics;  Laterality: Left;  LEFT GLENOHUMERAL INJECTION   TUBAL LIGATION  2010   UPPER GI ENDOSCOPY       reports that she quit smoking about  5 years ago. Her smoking use included cigars. She started smoking about 7 years ago. She has never used smokeless tobacco. She reports that she does not drink alcohol and does not use drugs.  Allergies[1]  Family History  Problem Relation Age of Onset   Drug abuse Father    Early death Father    Hypertension Mother    Diabetes Mother    Autism Daughter    ADD / ADHD Daughter    Learning disabilities Daughter    Thyroid  disease Sister    Colon cancer Maternal Grandmother    Cancer Maternal Grandmother     Prostate cancer Maternal Grandfather    Cancer Maternal Grandfather    Stroke Maternal Grandfather    Breast cancer Maternal Aunt    Hypertension Maternal Aunt    Diabetes Other        both sides of the fam   Hypertension Other        both sides of the fam   Alcohol abuse Maternal Uncle    Cancer Maternal Aunt    Depression Maternal Aunt    Diabetes Maternal Aunt    Diabetes Paternal Uncle    Hypertension Paternal Uncle     Prior to Admission medications  Medication Sig Start Date End Date Taking? Authorizing Provider  cetirizine (ZYRTEC) 10 MG tablet Take 10 mg by mouth daily.    [provider]  cinacalcet (SENSIPAR) 90 MG tablet Take 120 mg by mouth daily. Takes two 60 mg tabs daily    [provider]  docusate sodium (COLACE) 100 MG capsule Take 300 mg by mouth daily.    [provider]  famotidine  (PEPCID ) 20 MG tablet Take 1 tablet (20 mg total) by mouth daily as directed for heartburn. Patient taking differently: Take 20 mg by mouth at bedtime. 01/13/23     famotidine  (PEPCID ) 20 MG tablet Take 1 tablet (20 mg total) by mouth daily as directed for heartburn. Patient taking differently: Take 20 mg by mouth at bedtime. 02/14/24     linaclotide  (LINZESS ) 145 MCG CAPS capsule Take 1 capsule (145 mcg total) by mouth daily as needed. 02/14/24     norethindrone (AYGESTIN) 5 MG tablet Take 1 tablet by mouth daily. 11/23/19   [provider]  norethindrone (AYGESTIN) 5 MG tablet Take 5 mg by mouth. 01/05/20   [provider]  oxyCODONE  (ROXICODONE ) 5 MG immediate release tablet Take 1 tablet (5 mg total) by mouth every 4 (four) hours as needed for severe pain (pain score 7-10) or breakthrough pain. 04/25/24   Genelle Standing, MD  sevelamer carbonate (RENVELA) 800 MG tablet Take 800 mg by mouth. Take 5 tablets three times daily with meal and 2 tablets two times with snack    [provider]  SUPER B COMPLEX/C PO  03/17/21   [provider]  triamcinolone  cream (KENALOG ) 0.1 % Apply 1 application topically 2 (two) times daily. 02/25/21   Johnny Garnette LABOR, MD  Zinc  50 MG TABS  02/04/21   [provider]    Physical Exam: Vitals:   07/08/24 1438 07/08/24 1440 07/08/24 1451 07/08/24 1745  BP:  (!) 149/59  (!) 138/50  Pulse:  (!) 101  (!) 107  Resp:  18  19  Temp:  98.5 F (36.9 C)    SpO2: 100% 100%  100%  Weight:   131 kg   Height:   5' 8 (1.727 m)     Constitutional: NAD, calm, comfortable, on  room air, sitting on the chair, appears sick Eyes: PERRL, lids and conjunctivae normal ENMT: Mucous membranes are moist. Posterior pharynx clear of any exudate or lesions.Normal dentition.  Neck: normal, supple, no masses, no thyromegaly Respiratory: Bilateral rhonchi positive Cardiovascular: Regular rate and rhythm, no murmurs / rubs / gallops. No extremity edema. 2+ pedal pulses. No carotid bruits.  Abdomen: no tenderness, no masses palpated. No hepatosplenomegaly. Bowel sounds positive.  Musculoskeletal: no clubbing / cyanosis. No joint deformity upper and lower extremities. Good ROM, no contractures. Normal muscle tone.  Skin: no rashes, lesions, ulcers. No induration Neurologic: CN 2-12 grossly intact. Sensation intact, DTR normal. Strength 5/5 in all 4.  Psychiatric: Normal judgment and insight. Alert and oriented x 3. Normal mood.    Labs on Admission: I have personally reviewed following labs and imaging studies  CBC: Recent Labs  Lab 07/08/24 1634  HGB 11.6*  HCT 34.0*   Basic Metabolic Panel: Recent Labs  Lab 07/08/24 1458 07/08/24 1634  NA 136 137  K 5.1 5.4*  CL 100 102  CO2 19*  --   GLUCOSE 92 91  BUN 53* 57*  CREATININE 16.58* 17.20*  CALCIUM 7.8*  --    GFR: Estimated Creatinine Clearance: 6.1 mL/min (A) (by C-G formula based on SCr of 17.2 mg/dL (H)). Liver Function Tests: Recent Labs  Lab 07/08/24 1458  AST 10*  ALT 16  ALKPHOS 138*  BILITOT 1.3*  PROT 7.7   ALBUMIN 3.7   Recent Labs  Lab 07/08/24 1458  LIPASE 48   No results for input(s): AMMONIA in the last 168 hours. Coagulation Profile: No results for input(s): INR, PROTIME in the last 168 hours. Cardiac Enzymes: No results for input(s): CKTOTAL, CKMB, CKMBINDEX, TROPONINI in the last 168 hours. BNP (last 3 results) Recent Labs    01/23/24 1655  PROBNP 8,472.0*   HbA1C: No results for input(s): HGBA1C in the last 72 hours. CBG: No results for input(s): GLUCAP in the last 168 hours. Lipid Profile: No results for input(s): CHOL, HDL, LDLCALC, TRIG, CHOLHDL, LDLDIRECT in the last 72 hours. Thyroid  Function Tests: No results for input(s): TSH, T4TOTAL, FREET4, T3FREE, THYROIDAB in the last 72 hours. Anemia Panel: No results for input(s): VITAMINB12, FOLATE, FERRITIN, TIBC, IRON, RETICCTPCT in the last 72 hours. Urine analysis:    Component Value Date/Time   COLORURINE YELLOW 02/01/2015 1554   APPEARANCEUR HAZY (A) 02/01/2015 1554   LABSPEC 1.010 02/01/2015 1554   PHURINE 8.5 (H) 02/01/2015 1554   GLUCOSEU NEGATIVE 02/01/2015 1554   HGBUR NEGATIVE 02/01/2015 1554   BILIRUBINUR NEGATIVE 02/01/2015 1554   KETONESUR NEGATIVE 02/01/2015 1554   PROTEINUR 100 (A) 02/01/2015 1554   UROBILINOGEN 0.2 02/01/2015 1554   NITRITE NEGATIVE 02/01/2015 1554   LEUKOCYTESUR TRACE (A) 02/01/2015 1554    Radiological Exams on Admission: DG Chest Portable 1 View Result Date: 07/08/2024 CLINICAL DATA:  SOB EXAM: PORTABLE CHEST 1 VIEW COMPARISON:  October 01, 2017, January 23, 2024 FINDINGS: Evaluation is limited by positioning. The cardiomediastinal silhouette is unchanged in contour. Similar perihilar vascular prominence. LEFT-sided stents. No pleural effusion. No pneumothorax. Increased bibasilar reticulonodular opacities. IMPRESSION: Increased bibasilar reticulonodular opacities. Differential considerations include atelectasis, edema or  infection. Electronically Signed   By: Corean Salter M.D.   On: 07/08/2024 15:46    EKG: Independently reviewed.  Sinus tachycardia, no acute ST-T wave changes noted.  Assessment/Plan  Influenza A: - Patient presented with shortness of breath and flulike symptoms.  -Currently maintaining oxygen saturation on room  air. - Will admit patient on the floor under observation - Given Tamiflu  30 mg in the ER will cont. on days of dialysis (due to ESRD) - DuoNebs as needed for shortness of breath and wheezing - On continuous pulse ox.  ESRD on dialysis (MWF) Anion gap metabolic acidosis - She missed dialysis on Friday since she was sick. - EDP consulted nephrology-plan is getting dialysis tonight  Elevated blood-pressure reading without diagnosis of hypertension: - Likely due to missed dialysis.  Continue to monitor - Hydralazine  as needed.  Lupus: - Followed rheumatology outpatient.  Currently not on any treatment  OSA: On CPAP at home  Anemia of chronic disease: - In the setting of ESRD.  H&H is stable.  Continue to monitor.  Morbid obesity with BMI of 43: - Diet modification exercise recommended  DVT prophylaxis: Heparin  Code Status: Full code Family Communication: None present at bedside.  Plan of care discussed with patient in length and he verbalized understanding and agreed with it. Disposition Plan: Home Consults called: Nephrology admission status: Observation   Velna JONELLE Skeeter MD [facility]  If 7PM-7AM, please contact night-coverage [email or web address]  07/08/2024, 6:29 PM        [1]  Allergies Allergen Reactions   Azithromycin Hives   Benadryl [Diphenhydramine Hcl] Hives   Clindamycin /Lincomycin Itching    Redness   Doxycycline Hyclate    Vancomycin Swelling   Adhesive [Tape] Rash

## 2024-07-09 DIAGNOSIS — J111 Influenza due to unidentified influenza virus with other respiratory manifestations: Secondary | ICD-10-CM | POA: Diagnosis not present

## 2024-07-09 DIAGNOSIS — J810 Acute pulmonary edema: Secondary | ICD-10-CM

## 2024-07-09 DIAGNOSIS — J101 Influenza due to other identified influenza virus with other respiratory manifestations: Secondary | ICD-10-CM | POA: Diagnosis not present

## 2024-07-09 MED ORDER — OSELTAMIVIR PHOSPHATE 30 MG PO CAPS: 30.0000 mg | ORAL_CAPSULE | ORAL | 0 refills | Status: AC

## 2024-07-09 MED ORDER — ONDANSETRON HCL 4 MG PO TABS: 4.0000 mg | ORAL_TABLET | Freq: Four times a day (QID) | ORAL | 0 refills | Status: AC | PRN

## 2024-07-09 MED ORDER — ALBUTEROL SULFATE HFA 108 (90 BASE) MCG/ACT IN AERS: 2.0000 | INHALATION_SPRAY | Freq: Four times a day (QID) | RESPIRATORY_TRACT | 1 refills | Status: AC | PRN

## 2024-07-09 MED ORDER — ZINC 220 (50 ZN) MG PO CAPS: 1.0000 | ORAL_CAPSULE | Freq: Every day | ORAL | 0 refills | Status: AC

## 2024-07-09 MED ORDER — MENTHOL 3 MG MT LOZG
1.0000 | LOZENGE | OROMUCOSAL | Status: DC | PRN
  Administered 2024-07-09: 3 mg via ORAL
  Filled 2024-07-09: qty 9

## 2024-07-09 MED ORDER — OSELTAMIVIR PHOSPHATE 30 MG PO CAPS: 30.0000 mg | ORAL_CAPSULE | ORAL | 0 refills | Status: DC

## 2024-07-09 MED ORDER — PHENOL 1.4 % MT LIQD: 1.0000 | OROMUCOSAL | Status: AC | PRN

## 2024-07-09 MED ORDER — VITAMIN C 1000 MG PO TABS: 1000.0000 mg | ORAL_TABLET | Freq: Every day | ORAL | 0 refills | Status: AC

## 2024-07-09 NOTE — Discharge Planning (Signed)
 [facility] Patient Discharge Orders- [facility]: [facility]  Patient's name: QUETZALI HEINLE Admit/DC Dates: 07/08/2024 - 07/09/2024  Discharge Diagnoses: Flu A    Aranesp: Given: no    Last Hgb: 11.0 PRBC's Given: no  ESA dose for discharge: same dose IV Iron dose at discharge: same dose  Heparin  change: no  EDW Change: no New EDW:   [place] Change: no  Access intervention/Change: no Details:  Hectorol/Calcitriol change: no  Discharge Labs: Calcium 7.8 Phosphorus -- Albumin -- K+ 5.4  IV Antibiotics: no Details:  On Coumadin?: no Last INR: Next INR: Managed By:   OTHER/APPTS/LAB ORDERS:    D/C Meds to be reconciled by nurse after every discharge.  Completed By: Lucie Collet, PA-C 07/09/2024, 4:18 PM  [facility] Pager: [phone]    Reviewed by: MD:______ RN_______

## 2024-07-09 NOTE — Progress Notes (Signed)
 Pt refused AM labs per phlebotomist. Dr. Alfornia notified.

## 2024-07-09 NOTE — Discharge Summary (Signed)
 Physician Discharge Summary   Patient: Cindy Luna MRN: [id number] DOB: 1980/11/07  Admit date:     07/08/2024  Discharge date: 07/09/2024  Discharge Physician: Garnette Pelt   PCP: Mercer Clotilda SAUNDERS, MD   Recommendations at discharge:    Follow up with PCP in 1-2 weeks Follow up with scheduled HD session MWF  Discharge Diagnoses: Principal Problem:   Influenza A  Resolved Problems:   * No resolved hospital problems. *  Hospital Course: 43 y.o. female with medical history significant of lupus, hyperlipidemia, GERD, obesity, OSA on CPAP, ESRD on dialysis (MWF) presented here with chest tightness, shortness of breath, fever, headache, body ache, diarrhea since Thursday.   She started feeling bad on Thursday.  She missed her dialysis yesterday since she was feeling bad.  No recent sick contact.  Reports that she was short of breath earlier this morning when she called EMS however now her breathing have improved.  Patient reports that she had been compliant with her dialysis appointments otherwise.  She denies chest pain, blurry vision, abdominal pain, nausea, vomiting or melena.   She has sleep apnea and have been compliant with CPAP however last night she was unable to keep her CPAP on due to the difficulty breathing.  respiratory panel positive for flu A   Assessment and Plan: Influenza A: - Patient presented with shortness of breath and flulike symptoms.  -Currently maintaining oxygen saturation on room air. - Given Tamiflu  30 mg in the ER will cont. on days of dialysis (due to ESRD) - DuoNebs as needed for shortness of breath and wheezing -remained on room air   ESRD on dialysis (MWF) Anion gap metabolic acidosis - She missed dialysis on Friday since she was sick. - EDP consulted nephrology. Pt declined HD at time of presentation -Pt later declined blood draws and instead requested following up with her scheduled HD session MWF. Discussed with nephrology, ok to d/c today    Elevated blood-pressure reading without diagnosis of hypertension: - Likely due to missed dialysis.  Continue to monitor - Hydralazine  as needed.   Lupus: - Followed rheumatology outpatient.  Currently not on any treatment   OSA: On CPAP at home   Anemia of chronic disease: - In the setting of ESRD.  H&H is stable.     Morbid obesity with BMI of 43: - Diet modification exercise recommended          Consultants: Nephrology Procedures performed:   Disposition: Home Diet recommendation:  Renal diet DISCHARGE MEDICATION: Allergies as of 07/09/2024       Reactions   Azithromycin Hives   Benadryl [diphenhydramine Hcl] Hives   Clindamycin /lincomycin Itching   Redness   Doxycycline Hyclate    Vancomycin Swelling   Adhesive [tape] Rash        Medication List     STOP taking these medications    SUPER B COMPLEX/C PO   triamcinolone  cream 0.1 % Commonly known as: KENALOG    Zinc  50 MG Tabs       TAKE these medications    albuterol  108 (90 Base) MCG/ACT inhaler Commonly known as: VENTOLIN  HFA Inhale 2 puffs into the lungs every 6 (six) hours as needed for wheezing or shortness of breath.   cetirizine 10 MG tablet Commonly known as: ZYRTEC Take 10 mg by mouth daily.   cinacalcet 60 MG tablet Commonly known as: SENSIPAR Take 120 mg by mouth daily. What changed: Another medication with the same name was removed. Continue taking this medication,  and follow the directions you see here.   docusate sodium 100 MG capsule Commonly known as: COLACE Take 300 mg by mouth daily.   famotidine  20 MG tablet Commonly known as: PEPCID  Take 1 tablet (20 mg total) by mouth daily as directed for heartburn. What changed:  when to take this Another medication with the same name was removed. Continue taking this medication, and follow the directions you see here.   Linzess  145 MCG Caps capsule Generic drug: linaclotide  Take 1 capsule (145 mcg total) by mouth daily as  needed.   norethindrone 5 MG tablet Commonly known as: AYGESTIN Take 5 mg by mouth. What changed: Another medication with the same name was removed. Continue taking this medication, and follow the directions you see here.   ondansetron  4 MG tablet Commonly known as: ZOFRAN  Take 1 tablet (4 mg total) by mouth every 6 (six) hours as needed for nausea.   oseltamivir  30 MG capsule Commonly known as: TAMIFLU  Take 1 capsule (30 mg total) by mouth every Monday, Wednesday, and Friday with hemodialysis for 5 days. Start taking on: July 10, 2024   oxyCODONE  5 MG immediate release tablet Commonly known as: Roxicodone  Take 1 tablet (5 mg total) by mouth every 4 (four) hours as needed for severe pain (pain score 7-10) or breakthrough pain.   phenol 1.4 % Liqd Commonly known as: CHLORASEPTIC Use as directed 1 spray in the mouth or throat as needed for throat irritation / pain.   sevelamer carbonate 800 MG tablet Commonly known as: RENVELA Take 800 mg by mouth. Take 5 tablets three times daily with meal and 2 tablets two times with snack   vitamin C  1000 MG tablet Take 1 tablet (1,000 mg total) by mouth daily for 5 days.   Zinc  220 (50 Zn) MG Caps Take 1 capsule (220 mg total) by mouth daily for 5 days.        Follow-up Information     Mercer Clotilda SAUNDERS, MD Follow up in 2 week(s).   Specialty: Family Medicine Why: Hospital follow up Contact information: [address] [place] [facility] [place] [address] [phone]         follow up with scheduled HD session Follow up.                 Discharge Exam: Filed Weights   07/08/24 1451  Weight: 131 kg   General exam: Awake, laying in bed, in nad Respiratory system: Normal respiratory effort, no wheezing Cardiovascular system: regular rate, s1, s2 Gastrointestinal system: Soft, nondistended, positive BS Central nervous system: CN2-12 grossly intact, strength intact Extremities: Perfused, no clubbing Skin: Normal skin  turgor, no notable skin lesions seen Psychiatry: Mood normal // no visual hallucinations   Condition at discharge: fair  The results of significant diagnostics from this hospitalization (including imaging, microbiology, ancillary and laboratory) are listed below for reference.   Imaging Studies: DG Chest Portable 1 View Result Date: 07/08/2024 CLINICAL DATA:  SOB EXAM: PORTABLE CHEST 1 VIEW COMPARISON:  October 01, 2017, January 23, 2024 FINDINGS: Evaluation is limited by positioning. The cardiomediastinal silhouette is unchanged in contour. Similar perihilar vascular prominence. LEFT-sided stents. No pleural effusion. No pneumothorax. Increased bibasilar reticulonodular opacities. IMPRESSION: Increased bibasilar reticulonodular opacities. Differential considerations include atelectasis, edema or infection. Electronically Signed   By: Corean Salter M.D.   On: 07/08/2024 15:46    Microbiology: Results for orders placed or performed during the hospital encounter of 07/08/24  Resp panel by RT-PCR (RSV, Flu A&B, Covid)  Anterior Nasal Swab     Status: Abnormal   Collection Time: 07/08/24  2:58 PM   Specimen: Anterior Nasal Swab  Result Value Ref Range Status   SARS Coronavirus 2 by RT PCR NEGATIVE NEGATIVE Final   Influenza A by PCR POSITIVE (A) NEGATIVE Final   Influenza B by PCR NEGATIVE NEGATIVE Final    Comment: (NOTE) The Xpert Xpress SARS-CoV-2/FLU/RSV plus assay is intended as an aid in the diagnosis of influenza from Nasopharyngeal swab specimens and should not be used as a sole basis for treatment. Nasal washings and aspirates are unacceptable for Xpert Xpress SARS-CoV-2/FLU/RSV testing.  Fact Sheet for Patients: bloggercourse.com  Fact Sheet for Healthcare Providers: seriousbroker.it  This test is not yet approved or cleared by the [place]  FDA and has been authorized for detection and/or diagnosis of SARS-CoV-2 by FDA  under an Emergency Use Authorization (EUA). This EUA will remain in effect (meaning this test can be used) for the duration of the COVID-19 declaration under Section 564(b)(1) of the Act, 21 U.S.C. section 360bbb-3(b)(1), unless the authorization is terminated or revoked.     Resp Syncytial Virus by PCR NEGATIVE NEGATIVE Final    Comment: (NOTE) Fact Sheet for Patients: bloggercourse.com  Fact Sheet for Healthcare Providers: seriousbroker.it  This test is not yet approved or cleared by the [place]  FDA and has been authorized for detection and/or diagnosis of SARS-CoV-2 by FDA under an Emergency Use Authorization (EUA). This EUA will remain in effect (meaning this test can be used) for the duration of the COVID-19 declaration under Section 564(b)(1) of the Act, 21 U.S.C. section 360bbb-3(b)(1), unless the authorization is terminated or revoked.  Performed at [facility], [address] [place], [place] [address]     Labs: CBC: Recent Labs  Lab 07/08/24 1634 07/08/24 2004  WBC  --  5.6  NEUTROABS  --  3.7  HGB 11.6* 11.0*  HCT 34.0* 33.6*  MCV  --  93.9  PLT  --  165   Basic Metabolic Panel: Recent Labs  Lab 07/08/24 1458 07/08/24 1634  NA 136 137  K 5.1 5.4*  CL 100 102  CO2 19*  --   GLUCOSE 92 91  BUN 53* 57*  CREATININE 16.58* 17.20*  CALCIUM 7.8*  --    Liver Function Tests: Recent Labs  Lab 07/08/24 1458  AST 10*  ALT 16  ALKPHOS 138*  BILITOT 1.3*  PROT 7.7  ALBUMIN 3.7   CBG: No results for input(s): GLUCAP in the last 168 hours.  Discharge time spent: less than 30 minutes.  Signed: Garnette Pelt, MD [facility] 07/09/2024

## 2024-07-09 NOTE — Progress Notes (Addendum)
 [facility] Progress Note   Subjective:   Seen in room. Reports still not feeling well. Throat is sore and mild shortness of breath. Denies CP, dizziness, nausea.   Objective Vitals:   07/08/24 2116 07/09/24 0052 07/09/24 0401 07/09/24 0407  BP: (!) 125/59 (!) 146/63 (!) 160/101 132/61  Pulse: (!) 102 97 (!) 103 (!) 105  Resp: 20 20 20    Temp: 98.5 F (36.9 C) 98.4 F (36.9 C) 99.1 F (37.3 C)   TempSrc:      SpO2: 98% 98% 99%   Weight:      Height:       Physical Exam General: Alert female in NAD Heart: RRR, no murmur Lungs: CTA bilaterally, respirations unlabored Abdomen: Soft, non-distended, +BS Extremities: No edema b/l lower extremities Dialysis Access:  RUE AVF + t/b  Additional Objective Labs: Basic Metabolic Panel: Recent Labs  Lab 07/08/24 1458 07/08/24 1634  NA 136 137  K 5.1 5.4*  CL 100 102  CO2 19*  --   GLUCOSE 92 91  BUN 53* 57*  CREATININE 16.58* 17.20*  CALCIUM 7.8*  --    Liver Function Tests: Recent Labs  Lab 07/08/24 1458  AST 10*  ALT 16  ALKPHOS 138*  BILITOT 1.3*  PROT 7.7  ALBUMIN 3.7   Recent Labs  Lab 07/08/24 1458  LIPASE 48   CBC: Recent Labs  Lab 07/08/24 1634 07/08/24 2004  WBC  --  5.6  NEUTROABS  --  3.7  HGB 11.6* 11.0*  HCT 34.0* 33.6*  MCV  --  93.9  PLT  --  165   Blood Culture    Component Value Date/Time   SDES URINE, RANDOM 02/01/2015 1554   SPECREQUEST NONE 02/01/2015 1554   CULT  02/01/2015 1554    >=100,000 COLONIES/mL GROUP B STREP(S.AGALACTIAE)ISOLATED 20,000 COLONIES/mL CITROBACTER KOSERI    REPTSTATUS 02/03/2015 FINAL 02/01/2015 1554     Studies/Results: DG Chest Portable 1 View Result Date: 07/08/2024 CLINICAL DATA:  SOB EXAM: PORTABLE CHEST 1 VIEW COMPARISON:  October 01, 2017, January 23, 2024 FINDINGS: Evaluation is limited by positioning. The cardiomediastinal silhouette is unchanged in contour. Similar perihilar vascular prominence. LEFT-sided stents. No pleural  effusion. No pneumothorax. Increased bibasilar reticulonodular opacities. IMPRESSION: Increased bibasilar reticulonodular opacities. Differential considerations include atelectasis, edema or infection. Electronically Signed   By: Corean Salter M.D.   On: 07/08/2024 15:46   Medications:   Chlorhexidine  Gluconate Cloth  6 each Topical Q0600   heparin   5,000 Units Subcutaneous Q8H   [START ON 07/10/2024] oseltamivir   30 mg Oral Q M,W,F-HD    Dialysis Orders: MWF [facility]  4h   AVF  Heparin  6000+ 6000 (per pt) Missed HD Friday   Today's labs -> K+ 5.4, bun 57, creat 16, CO2 19, alb 3.7, Hb 11.6 Flu A+ on PCR CXR - looks negative, no edema  Assessment/Plan: SOB/ flu+PNA/ wheezing: per EMS pt wheezing but SpO2 sats were 100%. SpO2 here is also 100% on RA. CXR shows no edema nor obvious PNA, possibly basilar process on the L.  ESRD: on HD MWF. Missed HD Friday. Plan was for HD yesterday but rolled over to today due to high census. She does not want to do dialysis today, stable from a fluid standpoint. Encouraged her to allow labs. Will plan for HD tomorrow unless anything acute on RFP.  Volume: UF 2.5- 3 L as tolerated. Not on O2 here, CXR w/o edema.  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  Lucie Collet, PA-C 07/09/2024, 8:34 AM  [facility] Pager: [phone]  Pt seen, examined and agree w A/P as above.  Myer Fret MD  [facility] 07/09/2024, 4:17 PM

## 2024-07-09 NOTE — Hospital Course (Addendum)
 43 y.o. female with medical history significant of lupus, hyperlipidemia, GERD, obesity, OSA on CPAP, ESRD on dialysis (MWF) presented here with chest tightness, shortness of breath, fever, headache, body ache, diarrhea since Thursday.   She started feeling bad on Thursday.  She missed her dialysis yesterday since she was feeling bad.  No recent sick contact.  Reports that she was short of breath earlier this morning when she called EMS however now her breathing have improved.  Patient reports that she had been compliant with her dialysis appointments otherwise.  She denies chest pain, blurry vision, abdominal pain, nausea, vomiting or melena.   She has sleep apnea and have been compliant with CPAP however last night she was unable to keep her CPAP on due to the difficulty breathing.  respiratory panel positive for flu A

## 2024-07-10 ENCOUNTER — Telehealth: Payer: Self-pay

## 2024-07-10 NOTE — Transitions of Care (Post Inpatient/ED Visit) (Unsigned)
° °  07/10/2024  Name: Cindy Luna MRN: [id number] DOB: 01/12/1981  Today's TOC FU Call Status: Today's TOC FU Call Status:: Unsuccessful Call (1st Attempt) Unsuccessful Call (1st Attempt) Date: 07/10/24  Attempted to reach the patient regarding the most recent Inpatient/ED visit.  Follow Up Plan: Additional outreach attempts will be made to reach the patient to complete the Transitions of Care (Post Inpatient/ED visit) call.   Signature Julian Lemmings, LPN [facility] Nurse Health Advisor Direct Dial [phone]

## 2024-07-11 ENCOUNTER — Encounter (HOSPITAL_BASED_OUTPATIENT_CLINIC_OR_DEPARTMENT_OTHER): Admitting: Physical Therapy

## 2024-07-11 LAB — HEPATITIS B SURFACE ANTIBODY, QUANTITATIVE: Hep B S AB Quant (Post): 39.2 m[IU]/mL

## 2024-07-12 NOTE — Transitions of Care (Post Inpatient/ED Visit) (Unsigned)
° °  07/12/2024  Name: Cindy Luna MRN: [id number] DOB: Jun 03, 1981  Today's TOC FU Call Status: Today's TOC FU Call Status:: Unsuccessful Call (2nd Attempt) Unsuccessful Call (1st Attempt) Date: 07/10/24 Unsuccessful Call (2nd Attempt) Date: 07/12/24  Attempted to reach the patient regarding the most recent Inpatient/ED visit.  Follow Up Plan: Additional outreach attempts will be made to reach the patient to complete the Transitions of Care (Post Inpatient/ED visit) call.   Signature Julian Lemmings, LPN [facility] Nurse Health Advisor Direct Dial [phone]

## 2024-07-13 ENCOUNTER — Encounter (HOSPITAL_BASED_OUTPATIENT_CLINIC_OR_DEPARTMENT_OTHER)

## 2024-07-14 NOTE — Transitions of Care (Post Inpatient/ED Visit) (Signed)
" ° °  07/14/2024  Name: Cindy Luna MRN: [id number] DOB: 08/08/1980  Today's TOC FU Call Status: Today's TOC FU Call Status:: Unsuccessful Call (3rd Attempt) Unsuccessful Call (1st Attempt) Date: 07/10/24 Unsuccessful Call (2nd Attempt) Date: 07/12/24 Unsuccessful Call (3rd Attempt) Date: 07/14/24  Attempted to reach the patient regarding the most recent Inpatient/ED visit.  Follow Up Plan: No further outreach attempts will be made at this time. We have been unable to contact the patient.  Signature Julian Lemmings, LPN [facility] Nurse Health Advisor Direct Dial [phone]  "

## 2024-07-17 ENCOUNTER — Encounter (HOSPITAL_BASED_OUTPATIENT_CLINIC_OR_DEPARTMENT_OTHER): Admitting: Physical Therapy

## 2024-07-18 ENCOUNTER — Encounter (HOSPITAL_BASED_OUTPATIENT_CLINIC_OR_DEPARTMENT_OTHER): Admitting: Physical Therapy

## 2024-07-19 ENCOUNTER — Encounter (HOSPITAL_BASED_OUTPATIENT_CLINIC_OR_DEPARTMENT_OTHER): Admitting: Physical Therapy

## 2024-08-01 ENCOUNTER — Encounter (HOSPITAL_BASED_OUTPATIENT_CLINIC_OR_DEPARTMENT_OTHER): Admitting: Physical Therapy

## 2024-08-01 ENCOUNTER — Encounter (HOSPITAL_COMMUNITY): Payer: Self-pay | Admitting: Orthopaedic Surgery

## 2024-08-03 ENCOUNTER — Encounter (HOSPITAL_BASED_OUTPATIENT_CLINIC_OR_DEPARTMENT_OTHER): Payer: Self-pay | Admitting: Physical Therapy

## 2024-08-03 ENCOUNTER — Ambulatory Visit (HOSPITAL_BASED_OUTPATIENT_CLINIC_OR_DEPARTMENT_OTHER): Attending: Orthopaedic Surgery | Admitting: Physical Therapy

## 2024-08-03 DIAGNOSIS — M25511 Pain in right shoulder: Secondary | ICD-10-CM | POA: Insufficient documentation

## 2024-08-03 DIAGNOSIS — M25611 Stiffness of right shoulder, not elsewhere classified: Secondary | ICD-10-CM | POA: Insufficient documentation

## 2024-08-03 DIAGNOSIS — M6281 Muscle weakness (generalized): Secondary | ICD-10-CM | POA: Insufficient documentation

## 2024-08-03 DIAGNOSIS — M7581 Other shoulder lesions, right shoulder: Secondary | ICD-10-CM | POA: Diagnosis present

## 2024-08-03 NOTE — Therapy (Signed)
 " OUTPATIENT PHYSICAL THERAPY THERAPY    Patient Name: Cindy Luna MRN: [id number] DOB:01/28/1981, 44 y.o., female Today's Date: 08/03/2024  END OF SESSION:  PT End of Session - 08/03/24 1348     Visit Number 15    Number of Visits 20    Date for Recertification  08/26/24    Authorization Type [employer] Dual Complete    Progress Note Due on Visit 20    PT Start Time 1347    PT Stop Time 1426    PT Time Calculation (min) 39 min    Activity Tolerance Patient tolerated treatment well    Behavior During Therapy [facility] for tasks assessed/performed                    Past Medical History:  Diagnosis Date   Allergy    Anemia    Anxiety    Depression    DUB (dysfunctional uterine bleeding) 06/24/2016   End stage renal disease (HCC)    Dialysis Mon-Wed-Fri   Fatty liver    GERD (gastroesophageal reflux disease)    HLD (hyperlipidemia)    HTN (hypertension) 2007   with pregnancy but resolved after delivery   Lupus    Osteoarthritis    shoulders   Ovarian cyst 09/01/2016   Peripheral vascular disease    Pneumonia    x 1   Sleep apnea    Past Surgical History:  Procedure Laterality Date   AV FISTULA PLACEMENT  2016 right, 2009 left   left and right are both working    BICEPT TENODESIS Right 04/25/2024   Procedure: TENODESIS, BICEPS;  Surgeon: Genelle Standing, MD;  Location: [facility] OR;  Service: Orthopedics;  Laterality: Right;   CESAREAN SECTION     x 1   COLONOSCOPY     OTHER SURGICAL HISTORY Left    AV fistula stents, arm   SHOULDER INJECTION Left 04/25/2024   Procedure: INJECTION, SHOULDER;  Surgeon: Genelle Standing, MD;  Location: [facility] OR;  Service: Orthopedics;  Laterality: Left;  LEFT GLENOHUMERAL INJECTION   TUBAL LIGATION  2010   UPPER GI ENDOSCOPY     Patient Active Problem List   Diagnosis Date Noted   Influenza A 07/08/2024   Biceps tendinitis of right upper extremity 04/25/2024   Arthrosis of right acromioclavicular joint 04/25/2024   Tendinitis of right  rotator cuff 04/25/2024   Rotator cuff tendonitis, left 04/25/2024   Unilateral primary osteoarthritis, right hip 01/27/2024   DM (diabetes mellitus) (HCC)    Chronic constipation 11/16/2019   Eczema 11/16/2019   Environmental and seasonal allergies 07/17/2019   History of depression 07/17/2019   Patient is Jehovah's Witness 07/13/2019   Chest tightness 01/04/2019   Dysmenorrhea 05/03/2018   Class 3 severe obesity with serious comorbidity and body mass index (BMI) of 45.0 to 49.9 in adult (HCC) 04/18/2018   Lymphadenopathy of head and neck 09/29/2017   OSA (obstructive sleep apnea) 12/10/2014   ESRD (end stage renal disease) on dialysis (HCC) 11/19/2014   Lupus (HCC) 11/19/2014     REFERRING PROVIDER: Standing LITTIE Genelle MD   REFERRING DIAG:  M75.81 (ICD-10-CM) - Tendinitis of right rotator cuff     Arthroscopic extensive debridement - 29823 Subdeltoid Bursa, Supraspinatus Tendon, Anterior Labrum, and Superior Labrum Arthroscopic distal clavicle excision - 70175 Arthroscopic subacromial decompression - 70173 Arthroscopic biceps tenodesis - 70171 Rationale for Evaluation and Treatment: Rehabilitation  THERAPY DIAG:  Stiffness of right shoulder, not elsewhere classified  Muscle weakness (generalized)  Acute  pain of right shoulder  ONSET DATE: DOS 04/25/24   SUBJECTIVE:                                                                                                                                                                                           SUBJECTIVE STATEMENT:   I have been out for about a month because I got the flu. I am taking muscle relaxers to ease the muscle cramps- I only take it at night. No cramping  during the day but it wakes me up at night.  I was doing so good before I got sick, I could raise my arm through flexion without issue. Now it hurts and the whole back of my shoulder blade hurts. I have gotten more comfortable sleeping on that side and I  think that is what's making it so sore. Now my left shoulder is giving me problems. I have been using the distilled water as a weight for rows. Doing neck stretches and shoulder blade stretches.    PERTINENT HISTORY:  Dialysis, end-stage renal disease lupus   PAIN:  Are you having pain? No and Yes: NPRS scale: 0/10 at rest, no greater than a 4/10 pain with movement Pain location: anterior right shoulder Pain description: tight Aggravating factors: spasm Relieving factors: rest   PRECAUTIONS:  Dialysis- shoulder has to be still for 4 hours 3 days/week   RED FLAGS: None   WEIGHT BEARING RESTRICTIONS:  No  FALLS:  Has patient fallen in last 6 months? No   OCCUPATION:  Not working  PLOF:  Independent  PATIENT GOALS:  Have my mobility back  OBJECTIVE:  Note: Objective measures were completed at Evaluation unless otherwise noted.   PATIENT SURVEYS:  UEFS  Extreme difficulty/unable (0), Quite a bit of difficulty (1), Moderate difficulty (2), Little difficulty (3), No difficulty (4) Survey date:  10/3 EVAL 11/4 11/18 08/03/24  Any of your usual work, household or school activities 0 1 1 3   2. Your usual hobbies, recreational/sport activities 0 1 3 4    3. Lifting a bag of groceries to waist level 0 1 1 3    4. Lifting a bag of groceries above your head 0 0 0 1  5. Grooming your hair 0 2 2 3   6. Pushing up on your hands (I.e. from bathtub or chair) 0 1 1 3   7. Preparing food (I.e. peeling/cutting) 0 2 1 4   8. Driving  0 1 1 3   9. Vacuuming, sweeping, or raking 0 1 2 4   10. Dressing  1 1 4 4   11. Doing up buttons 3 4 1 4   12.  Using tools/appliances 0 1 1 4   13. Opening doors 0 1 4 4   14. Cleaning  0 1 1 3   15. Tying or lacing shoes 1 4 4 4   16. Sleeping  2 2 1 2   17. Laundering clothes (I.e. washing, ironing, folding) 0 2 3 4   18. Opening a jar 1 1 3 3   19. Throwing a ball 0 1 1 3   20. Carrying a small suitcase with your affected limb.  0 0 0 3  Score total:  8/80  28/80 34/80 66/80      POSTURE:  Eval: Rounded shoulder in sling as expected post op  HAND DOMINANCE:  Right   Body Part #1 Shoulder  PALPATION: EVAL: no s/s of infection  UPPER EXTREMITY ROM:  Passive ROM Right eval A/AAROM 11/4 Right R Passive 11/18 Rt AROM/AA 08/03/24  Shoulder flexion 50 in pendulum 65/ 140 on finger ladder 153 96/140  Shoulder extension      Shoulder abduction  50 143 80/140  Shoulder adduction      Shoulder extension      Shoulder internal rotation   51   Shoulder external rotation   75   Elbow flexion WFL     Elbow extension -10 0 -5   Wrist  WFL        (Blank rows = not tested)   UPPER EXTREMITY MMT:  MMT Right 11/18 Left 11/18 Rt 08/03/24  Shoulder flexion 3 4+ 3-/5  Shoulder extension     Shoulder abduction 3- 4- 3-/5  Shoulder adduction     Shoulder extension     Shoulder internal rotation 4+ 5 5  Shoulder external rotation 4+ 5 5  Middle trapezius     Lower trapezius     Elbow flexion 4+ 4+ 5  Elbow extension 4 4+ 5  Wrist flexion     Wrist extension     Wrist ulnar deviation     Wrist radial deviation     Wrist pronation     Wrist supination     Grip strength      (Blank rows = not tested)     TREATMENT DATE:   08/03/24 Objective testing, functional discussion Review of HEP STM rhomboids  07/04/24 Pulleys each flexion and abduction Wall slide flexion with lift off at top x10 Standing press out x10 0# Theraband row GTB x30 Chicken wing in standing attempts x10 (some discomfort) Supine active flexion 1# x10 PROM R shoulder all planes S/l ER 1# 2x10 S/l chicken wing x20 Bent over row 3# 2x10 Abduction isometric 5 2x10 Standing bil ER 2x10     06/27/24 Pulleys each flexion and extension Standing wand flexion (modified) ~10 reps Finger ladder with lift off at top x10 Standing press out x10 with 1#, x10 0# Theraband row GTB x30 Chicken wing in standing attempts x5 (unable) Supine active flexion  1# x10 (difficult) PROM R shoulder all planes S/l ER 1# 2x10 (0# last 5 reps) HEP review    06/21/24 Pulleys in flexion and scaption Pt received R shoulder PROM in flexion, scaption, ER, and IR per pt and tissue tolerance.  Supine flexion AROM/AAROM x 10 reps, AROM x 10 reps Attempted wand flexion in a reclined position Supine flexion AROM with head elevated 2x10 S/L ER 2x10 S/L shoulder abduction 2x10 Shoulder submax isometric with 5 sec hold   PATIENT EDUCATION:  Education details: Anatomy of condition, POC, HEP, exercise form/rationale Person educated: Patient Education method: Explanation, Demonstration, Tactile cues,  Verbal cues, and Handouts Education comprehension: verbalized understanding, returned demonstration, verbal cues required, tactile cues required, and needs further education   HOME EXERCISE PROGRAM: Access Code: [email or web address] URL: [email or web address] Date: 04/28/2024 Prepared by: Harlene Cordon    ASSESSMENT:  CLINICAL IMPRESSION:  Pt arrived to PT with increased pain after being sick for an extended period of time. Was able to demo full AROM with less discomfort following manual therapy. Encouraged her to take a step back to HEP as prescribed today until her next appt, try using back massager, and try to reduce sleeping on Rt side. As long as pain stays down, will be prepared to progress as tolerated at next visit.   PERSONAL FACTORS: Patient is currently on dialysis 3 days/week are also affecting patient's functional outcome.   REHAB POTENTIAL: Good  CLINICAL DECISION MAKING: Stable/uncomplicated  EVALUATION COMPLEXITY: Low   GOALS: Goals reviewed with patient? Yes  SHORT TERM GOALS: Target date: 05/13/24  Patient will demonstrate full active range of motion of elbow without discomfort and without assistance Baseline: Goal status: MET  2.  Demonstrate full passive range of motion flexion as well as active assisted range of  motion on pulleys without increase in pain Baseline:  Goal status: MET  3.  Pain to stay below 5 out of 10 with daily activities Baseline:  Goal status: MET  4.  Independent in self-care as mom will be leaving Baseline:  Goal status: met- can perform self care but has 72 yo daughter who is autistic and requires care    LONG TERM GOALS: Target date: POC date  Full active range of motion of shoulder without compensatory patterns and without impingement Baseline:  Goal status: ongoing 1/8, limited in flexion and abd by pain  2.  UEFS to improve by MDC x 3 Baseline:  Goal status: IN PROGRESS 11/18  3.  Gross shoulder strength to 85% of the opposite upper extremity Baseline:  Goal status: ongoing, not appropriate to test due to inability to move Rt UE against gravity through ROM  4.  Independent and long-term home exercise program for continued strength and stability Baseline:  Goal status: ongoing 1/8- requires further training    PLAN:  PT FREQUENCY: 1-2x/week  PT DURATION: Plan of care date  PLANNED INTERVENTIONS: 97164- PT Re-evaluation, 97750- Physical Performance Testing, 97110-Therapeutic exercises, 97530- Therapeutic activity, 97112- Neuromuscular re-education, 97535- Self Care, 02859- Manual therapy, [id number]- Aquatic Therapy, [phone] (1-2 muscles), 20561 (3+ muscles)- Dry Needling, Patient/Family education, Taping, Joint mobilization, Spinal mobilization, Scar mobilization, and Cryotherapy.  PLAN FOR NEXT SESSION: continue with SCM and clavicular mobs PRN, AROM against gravity.  Cont per protocol.  Adalis Gatti C. Ramere Downs PT, DPT 08/03/2024 2:26 PM      "

## 2024-08-10 ENCOUNTER — Ambulatory Visit (HOSPITAL_BASED_OUTPATIENT_CLINIC_OR_DEPARTMENT_OTHER)

## 2024-08-10 ENCOUNTER — Encounter (HOSPITAL_BASED_OUTPATIENT_CLINIC_OR_DEPARTMENT_OTHER): Payer: Self-pay

## 2024-08-10 ENCOUNTER — Ambulatory Visit: Admitting: Podiatry

## 2024-08-10 DIAGNOSIS — M6281 Muscle weakness (generalized): Secondary | ICD-10-CM

## 2024-08-10 DIAGNOSIS — M25611 Stiffness of right shoulder, not elsewhere classified: Secondary | ICD-10-CM

## 2024-08-10 DIAGNOSIS — M25511 Pain in right shoulder: Secondary | ICD-10-CM

## 2024-08-10 NOTE — Therapy (Signed)
 " OUTPATIENT PHYSICAL THERAPY THERAPY    Patient Name: Cindy Luna MRN: [id number] DOB:19-May-1981, 44 y.o., female Today's Date: 08/10/2024  END OF SESSION:  PT End of Session - 08/10/24 1349     Visit Number 16    Number of Visits 20    Date for Recertification  08/26/24    Authorization Type [employer] Dual Complete    Progress Note Due on Visit 20    PT Start Time 1347    PT Stop Time 1430    PT Time Calculation (min) 43 min    Activity Tolerance Patient tolerated treatment well    Behavior During Therapy [facility] for tasks assessed/performed                     Past Medical History:  Diagnosis Date   Allergy    Anemia    Anxiety    Depression    DUB (dysfunctional uterine bleeding) 06/24/2016   End stage renal disease (HCC)    Dialysis Mon-Wed-Fri   Fatty liver    GERD (gastroesophageal reflux disease)    HLD (hyperlipidemia)    HTN (hypertension) 2007   with pregnancy but resolved after delivery   Lupus    Osteoarthritis    shoulders   Ovarian cyst 09/01/2016   Peripheral vascular disease    Pneumonia    x 1   Sleep apnea    Past Surgical History:  Procedure Laterality Date   AV FISTULA PLACEMENT  2016 right, 2009 left   left and right are both working    BICEPT TENODESIS Right 04/25/2024   Procedure: TENODESIS, BICEPS;  Surgeon: Genelle Standing, MD;  Location: [facility] OR;  Service: Orthopedics;  Laterality: Right;   CESAREAN SECTION     x 1   COLONOSCOPY     OTHER SURGICAL HISTORY Left    AV fistula stents, arm   SHOULDER INJECTION Left 04/25/2024   Procedure: INJECTION, SHOULDER;  Surgeon: Genelle Standing, MD;  Location: [facility] OR;  Service: Orthopedics;  Laterality: Left;  LEFT GLENOHUMERAL INJECTION   TUBAL LIGATION  2010   UPPER GI ENDOSCOPY     Patient Active Problem List   Diagnosis Date Noted   Influenza A 07/08/2024   Biceps tendinitis of right upper extremity 04/25/2024   Arthrosis of right acromioclavicular joint 04/25/2024   Tendinitis of  right rotator cuff 04/25/2024   Rotator cuff tendonitis, left 04/25/2024   Unilateral primary osteoarthritis, right hip 01/27/2024   DM (diabetes mellitus) (HCC)    Chronic constipation 11/16/2019   Eczema 11/16/2019   Environmental and seasonal allergies 07/17/2019   History of depression 07/17/2019   Patient is Jehovah's Witness 07/13/2019   Chest tightness 01/04/2019   Dysmenorrhea 05/03/2018   Class 3 severe obesity with serious comorbidity and body mass index (BMI) of 45.0 to 49.9 in adult (HCC) 04/18/2018   Lymphadenopathy of head and neck 09/29/2017   OSA (obstructive sleep apnea) 12/10/2014   ESRD (end stage renal disease) on dialysis (HCC) 11/19/2014   Lupus (HCC) 11/19/2014     REFERRING PROVIDER: Standing LITTIE Genelle MD   REFERRING DIAG:  M75.81 (ICD-10-CM) - Tendinitis of right rotator cuff     Arthroscopic extensive debridement - 29823 Subdeltoid Bursa, Supraspinatus Tendon, Anterior Labrum, and Superior Labrum Arthroscopic distal clavicle excision - 70175 Arthroscopic subacromial decompression - 29826 Arthroscopic biceps tenodesis - 70171 Rationale for Evaluation and Treatment: Rehabilitation  THERAPY DIAG:  Muscle weakness (generalized)  Stiffness of right shoulder, not elsewhere classified  Acute pain of right shoulder  ONSET DATE: DOS 04/25/24   SUBJECTIVE:                                                                                                                                                                                           SUBJECTIVE STATEMENT:   Pt reports I feel like all the progress I made was wiped away. Shoulder has bene sore and tight. No pain at rest, only with movement.    Previous: I have been out for about a month because I got the flu. I am taking muscle relaxers to ease the muscle cramps- I only take it at night. No cramping  during the day but it wakes me up at night.  I was doing so good before I got sick, I could raise my  arm through flexion without issue. Now it hurts and the whole back of my shoulder blade hurts. I have gotten more comfortable sleeping on that side and I think that is what's making it so sore. Now my left shoulder is giving me problems. I have been using the distilled water as a weight for rows. Doing neck stretches and shoulder blade stretches.    PERTINENT HISTORY:  Dialysis, end-stage renal disease lupus   PAIN:  Are you having pain? No and Yes: NPRS scale: 0/10 at rest, no greater than a 4/10 pain with movement Pain location: anterior right shoulder Pain description: tight Aggravating factors: spasm Relieving factors: rest   PRECAUTIONS:  Dialysis- shoulder has to be still for 4 hours 3 days/week   RED FLAGS: None   WEIGHT BEARING RESTRICTIONS:  No  FALLS:  Has patient fallen in last 6 months? No   OCCUPATION:  Not working  PLOF:  Independent  PATIENT GOALS:  Have my mobility back  OBJECTIVE:  Note: Objective measures were completed at Evaluation unless otherwise noted.   PATIENT SURVEYS:  UEFS  Extreme difficulty/unable (0), Quite a bit of difficulty (1), Moderate difficulty (2), Little difficulty (3), No difficulty (4) Survey date:  10/3 EVAL 11/4 11/18 08/03/24  Any of your usual work, household or school activities 0 1 1 3   2. Your usual hobbies, recreational/sport activities 0 1 3 4    3. Lifting a bag of groceries to waist level 0 1 1 3    4. Lifting a bag of groceries above your head 0 0 0 1  5. Grooming your hair 0 2 2 3   6. Pushing up on your hands (I.e. from bathtub or chair) 0 1 1 3   7. Preparing food (I.e. peeling/cutting) 0 2 1 4   8. Driving  0 1  1 3  9. Vacuuming, sweeping, or raking 0 1 2 4   10. Dressing  1 1 4 4   11. Doing up buttons 3 4 1 4   12. Using tools/appliances 0 1 1 4   13. Opening doors 0 1 4 4   14. Cleaning  0 1 1 3   15. Tying or lacing shoes 1 4 4 4   16. Sleeping  2 2 1 2   17. Laundering clothes (I.e. washing, ironing, folding) 0  2 3 4   18. Opening a jar 1 1 3 3   19. Throwing a ball 0 1 1 3   20. Carrying a small suitcase with your affected limb.  0 0 0 3  Score total:  8/80 28/80 34/80 66/80      POSTURE:  Eval: Rounded shoulder in sling as expected post op  HAND DOMINANCE:  Right   Body Part #1 Shoulder  PALPATION: EVAL: no s/s of infection  UPPER EXTREMITY ROM:  Passive ROM Right eval A/AAROM 11/4 Right R Passive 11/18 Rt AROM/AA 08/03/24  Shoulder flexion 50 in pendulum 65/ 140 on finger ladder 153 96/140  Shoulder extension      Shoulder abduction  50 143 80/140  Shoulder adduction      Shoulder extension      Shoulder internal rotation   51   Shoulder external rotation   75   Elbow flexion WFL     Elbow extension -10 0 -5   Wrist  WFL        (Blank rows = not tested)   UPPER EXTREMITY MMT:  MMT Right 11/18 Left 11/18 Rt 08/03/24  Shoulder flexion 3 4+ 3-/5  Shoulder extension     Shoulder abduction 3- 4- 3-/5  Shoulder adduction     Shoulder extension     Shoulder internal rotation 4+ 5 5  Shoulder external rotation 4+ 5 5  Middle trapezius     Lower trapezius     Elbow flexion 4+ 4+ 5  Elbow extension 4 4+ 5  Wrist flexion     Wrist extension     Wrist ulnar deviation     Wrist radial deviation     Wrist pronation     Wrist supination     Grip strength      (Blank rows = not tested)     TREATMENT DATE:   08/10/24 Seated scap squeeze x20 Pulleys ea flexion and abduction  STM to rhomboids, pec, UT Shoulder rolls posterior rolls 2x15 Seated scap retraction 2x10 Standing ball rolls for shoulder flexion and abduction (green physioball) 2x10ea Seated self assisted flexion x10 HEP update Review of theracane use   08/03/24 Objective testing, functional discussion Review of HEP STM rhomboids  07/04/24 Pulleys each flexion and abduction Wall slide flexion with lift off at top x10 Standing press out x10 0# Theraband row GTB x30 Chicken wing in standing  attempts x10 (some discomfort) Supine active flexion 1# x10 PROM R shoulder all planes S/l ER 1# 2x10 S/l chicken wing x20 Bent over row 3# 2x10 Abduction isometric 5 2x10 Standing bil ER 2x10     06/27/24 Pulleys each flexion and extension Standing wand flexion (modified) ~10 reps Finger ladder with lift off at top x10 Standing press out x10 with 1#, x10 0# Theraband row GTB x30 Chicken wing in standing attempts x5 (unable) Supine active flexion 1# x10 (difficult) PROM R shoulder all planes S/l ER 1# 2x10 (0# last 5 reps) HEP review    06/21/24 Pulleys in  flexion and scaption Pt received R shoulder PROM in flexion, scaption, ER, and IR per pt and tissue tolerance.  Supine flexion AROM/AAROM x 10 reps, AROM x 10 reps Attempted wand flexion in a reclined position Supine flexion AROM with head elevated 2x10 S/L ER 2x10 S/L shoulder abduction 2x10 Shoulder submax isometric with 5 sec hold   PATIENT EDUCATION:  Education details: Anatomy of condition, POC, HEP, exercise form/rationale Person educated: Patient Education method: Explanation, Demonstration, Tactile cues, Verbal cues, and Handouts Education comprehension: verbalized understanding, returned demonstration, verbal cues required, tactile cues required, and needs further education   HOME EXERCISE PROGRAM: Access Code: [email or web address] URL: [email or web address] Date: 04/28/2024 Prepared by: Harlene Cordon    ASSESSMENT:  CLINICAL IMPRESSION:   Continued with regressed interventions due to increased pain level. Pt demonstrates significant pec tightness and posterior shoulder tightness. Again educated in use of theracane for this. She was able to complete seated self assisted flexion with minimal discomfort compared to actively. Will continue to monitor pain level and progress as tolerated.    PERSONAL FACTORS: Patient is currently on dialysis 3 days/week are also affecting patient's  functional outcome.   REHAB POTENTIAL: Good  CLINICAL DECISION MAKING: Stable/uncomplicated  EVALUATION COMPLEXITY: Low   GOALS: Goals reviewed with patient? Yes  SHORT TERM GOALS: Target date: 05/13/24  Patient will demonstrate full active range of motion of elbow without discomfort and without assistance Baseline: Goal status: MET  2.  Demonstrate full passive range of motion flexion as well as active assisted range of motion on pulleys without increase in pain Baseline:  Goal status: MET  3.  Pain to stay below 5 out of 10 with daily activities Baseline:  Goal status: MET  4.  Independent in self-care as mom will be leaving Baseline:  Goal status: met- can perform self care but has 66 yo daughter who is autistic and requires care    LONG TERM GOALS: Target date: POC date  Full active range of motion of shoulder without compensatory patterns and without impingement Baseline:  Goal status: ongoing 1/8, limited in flexion and abd by pain  2.  UEFS to improve by MDC x 3 Baseline:  Goal status: IN PROGRESS 11/18  3.  Gross shoulder strength to 85% of the opposite upper extremity Baseline:  Goal status: ongoing, not appropriate to test due to inability to move Rt UE against gravity through ROM  4.  Independent and long-term home exercise program for continued strength and stability Baseline:  Goal status: ongoing 1/8- requires further training    PLAN:  PT FREQUENCY: 1-2x/week  PT DURATION: Plan of care date  PLANNED INTERVENTIONS: 97164- PT Re-evaluation, 97750- Physical Performance Testing, 97110-Therapeutic exercises, 97530- Therapeutic activity, 97112- Neuromuscular re-education, 97535- Self Care, 02859- Manual therapy, [id number]- Aquatic Therapy, [phone] (1-2 muscles), 20561 (3+ muscles)- Dry Needling, Patient/Family education, Taping, Joint mobilization, Spinal mobilization, Scar mobilization, and Cryotherapy.  PLAN FOR NEXT SESSION: continue with SCM and  clavicular mobs PRN, AROM against gravity.  Cont per protocol.  Asberry Rodes, PTA  08/10/24 3:56 PM      "

## 2024-08-17 ENCOUNTER — Ambulatory Visit (HOSPITAL_BASED_OUTPATIENT_CLINIC_OR_DEPARTMENT_OTHER)

## 2024-08-17 ENCOUNTER — Encounter (HOSPITAL_BASED_OUTPATIENT_CLINIC_OR_DEPARTMENT_OTHER): Payer: Self-pay

## 2024-08-17 DIAGNOSIS — M25511 Pain in right shoulder: Secondary | ICD-10-CM

## 2024-08-17 DIAGNOSIS — M6281 Muscle weakness (generalized): Secondary | ICD-10-CM

## 2024-08-17 DIAGNOSIS — M25611 Stiffness of right shoulder, not elsewhere classified: Secondary | ICD-10-CM | POA: Diagnosis not present

## 2024-08-17 NOTE — Therapy (Signed)
 " OUTPATIENT PHYSICAL THERAPY THERAPY    Patient Name: Cindy Luna MRN: [id number] DOB:08/13/1980, 44 y.o., female Today's Date: 08/17/2024  END OF SESSION:  PT End of Session - 08/17/24 1355     Visit Number 17    Number of Visits 20    Date for Recertification  08/26/24    Authorization Type [employer] Dual Complete    Progress Note Due on Visit 20    PT Start Time 1352    PT Stop Time 1440    PT Time Calculation (min) 48 min    Activity Tolerance Patient tolerated treatment well    Behavior During Therapy [facility] for tasks assessed/performed                      Past Medical History:  Diagnosis Date   Allergy    Anemia    Anxiety    Depression    DUB (dysfunctional uterine bleeding) 06/24/2016   End stage renal disease (HCC)    Dialysis Mon-Wed-Fri   Fatty liver    GERD (gastroesophageal reflux disease)    HLD (hyperlipidemia)    HTN (hypertension) 2007   with pregnancy but resolved after delivery   Lupus    Osteoarthritis    shoulders   Ovarian cyst 09/01/2016   Peripheral vascular disease    Pneumonia    x 1   Sleep apnea    Past Surgical History:  Procedure Laterality Date   AV FISTULA PLACEMENT  2016 right, 2009 left   left and right are both working    BICEPT TENODESIS Right 04/25/2024   Procedure: TENODESIS, BICEPS;  Surgeon: Genelle Standing, MD;  Location: [facility] OR;  Service: Orthopedics;  Laterality: Right;   CESAREAN SECTION     x 1   COLONOSCOPY     OTHER SURGICAL HISTORY Left    AV fistula stents, arm   SHOULDER INJECTION Left 04/25/2024   Procedure: INJECTION, SHOULDER;  Surgeon: Genelle Standing, MD;  Location: [facility] OR;  Service: Orthopedics;  Laterality: Left;  LEFT GLENOHUMERAL INJECTION   TUBAL LIGATION  2010   UPPER GI ENDOSCOPY     Patient Active Problem List   Diagnosis Date Noted   Influenza A 07/08/2024   Biceps tendinitis of right upper extremity 04/25/2024   Arthrosis of right acromioclavicular joint 04/25/2024   Tendinitis of  right rotator cuff 04/25/2024   Rotator cuff tendonitis, left 04/25/2024   Unilateral primary osteoarthritis, right hip 01/27/2024   DM (diabetes mellitus) (HCC)    Chronic constipation 11/16/2019   Eczema 11/16/2019   Environmental and seasonal allergies 07/17/2019   History of depression 07/17/2019   Patient is Jehovah's Witness 07/13/2019   Chest tightness 01/04/2019   Dysmenorrhea 05/03/2018   Class 3 severe obesity with serious comorbidity and body mass index (BMI) of 45.0 to 49.9 in adult (HCC) 04/18/2018   Lymphadenopathy of head and neck 09/29/2017   OSA (obstructive sleep apnea) 12/10/2014   ESRD (end stage renal disease) on dialysis (HCC) 11/19/2014   Lupus (HCC) 11/19/2014     REFERRING PROVIDER: Standing LITTIE Genelle MD   REFERRING DIAG:  M75.81 (ICD-10-CM) - Tendinitis of right rotator cuff     Arthroscopic extensive debridement - 29823 Subdeltoid Bursa, Supraspinatus Tendon, Anterior Labrum, and Superior Labrum Arthroscopic distal clavicle excision - 70175 Arthroscopic subacromial decompression - 70173 Arthroscopic biceps tenodesis - 70171 Rationale for Evaluation and Treatment: Rehabilitation  THERAPY DIAG:  Muscle weakness (generalized)  Acute pain of right shoulder  Stiffness  of right shoulder, not elsewhere classified  ONSET DATE: DOS 04/25/24   SUBJECTIVE:                                                                                                                                                                                           SUBJECTIVE STATEMENT:   Pt reports I feel like all the progress I made was wiped away. Shoulder has bene sore and tight. No pain at rest, only with movement.    Previous: I have been out for about a month because I got the flu. I am taking muscle relaxers to ease the muscle cramps- I only take it at night. No cramping  during the day but it wakes me up at night.  I was doing so good before I got sick, I could raise my  arm through flexion without issue. Now it hurts and the whole back of my shoulder blade hurts. I have gotten more comfortable sleeping on that side and I think that is what's making it so sore. Now my left shoulder is giving me problems. I have been using the distilled water as a weight for rows. Doing neck stretches and shoulder blade stretches.    PERTINENT HISTORY:  Dialysis, end-stage renal disease lupus   PAIN:  Are you having pain? No and Yes: NPRS scale: 0/10 at rest, no greater than a 4/10 pain with movement Pain location: anterior right shoulder Pain description: tight Aggravating factors: spasm Relieving factors: rest   PRECAUTIONS:  Dialysis- shoulder has to be still for 4 hours 3 days/week   RED FLAGS: None   WEIGHT BEARING RESTRICTIONS:  No  FALLS:  Has patient fallen in last 6 months? No   OCCUPATION:  Not working  PLOF:  Independent  PATIENT GOALS:  Have my mobility back  OBJECTIVE:  Note: Objective measures were completed at Evaluation unless otherwise noted.   PATIENT SURVEYS:  UEFS  Extreme difficulty/unable (0), Quite a bit of difficulty (1), Moderate difficulty (2), Little difficulty (3), No difficulty (4) Survey date:  10/3 EVAL 11/4 11/18 08/03/24  Any of your usual work, household or school activities 0 1 1 3   2. Your usual hobbies, recreational/sport activities 0 1 3 4    3. Lifting a bag of groceries to waist level 0 1 1 3    4. Lifting a bag of groceries above your head 0 0 0 1  5. Grooming your hair 0 2 2 3   6. Pushing up on your hands (I.e. from bathtub or chair) 0 1 1 3   7. Preparing food (I.e. peeling/cutting) 0 2 1 4   8. Driving  0  1 1 3   9. Vacuuming, sweeping, or raking 0 1 2 4   10. Dressing  1 1 4 4   11. Doing up buttons 3 4 1 4   12. Using tools/appliances 0 1 1 4   13. Opening doors 0 1 4 4   14. Cleaning  0 1 1 3   15. Tying or lacing shoes 1 4 4 4   16. Sleeping  2 2 1 2   17. Laundering clothes (I.e. washing, ironing, folding) 0  2 3 4   18. Opening a jar 1 1 3 3   19. Throwing a ball 0 1 1 3   20. Carrying a small suitcase with your affected limb.  0 0 0 3  Score total:  8/80 28/80 34/80 66/80      POSTURE:  Eval: Rounded shoulder in sling as expected post op  HAND DOMINANCE:  Right   Body Part #1 Shoulder  PALPATION: EVAL: no s/s of infection  UPPER EXTREMITY ROM:  Passive ROM Right eval A/AAROM 11/4 Right R Passive 11/18 Rt AROM/AA 08/03/24  Shoulder flexion 50 in pendulum 65/ 140 on finger ladder 153 96/140  Shoulder extension      Shoulder abduction  50 143 80/140  Shoulder adduction      Shoulder extension      Shoulder internal rotation   51   Shoulder external rotation   75   Elbow flexion WFL     Elbow extension -10 0 -5   Wrist  WFL        (Blank rows = not tested)   UPPER EXTREMITY MMT:  MMT Right 11/18 Left 11/18 Rt 08/03/24  Shoulder flexion 3 4+ 3-/5  Shoulder extension     Shoulder abduction 3- 4- 3-/5  Shoulder adduction     Shoulder extension     Shoulder internal rotation 4+ 5 5  Shoulder external rotation 4+ 5 5  Middle trapezius     Lower trapezius     Elbow flexion 4+ 4+ 5  Elbow extension 4 4+ 5  Wrist flexion     Wrist extension     Wrist ulnar deviation     Wrist radial deviation     Wrist pronation     Wrist supination     Grip strength      (Blank rows = not tested)     TREATMENT DATE:    08/17/24 Reviewed theracane techniques Ball rolls at plinth flex/abd x10ea Bent over row 8# 2x10 Bil ER review Bent over H abd x10 Bent over extension 2x10 IR behind back Trialed post capsule stretch but denied stretch Seated cane flexion x10 Active R shoulder flexion x10 Active shoulder abduction x8 Seated scap squeeze x20 Pulleys ea flexion and abduction  STM to rhomboids, pec, UT Shoulder rolls posterior rolls 2x15     08/10/24 Seated scap squeeze x20 Pulleys ea flexion and abduction  STM to rhomboids, pec, UT Shoulder rolls  posterior rolls 2x15 Seated scap retraction 2x10 Standing ball rolls for shoulder flexion and abduction (green physioball) 2x10ea Seated self assisted flexion x10 HEP update Review of theracane use   08/03/24 Objective testing, functional discussion Review of HEP STM rhomboids  07/04/24 Pulleys each flexion and abduction Wall slide flexion with lift off at top x10 Standing press out x10 0# Theraband row GTB x30 Chicken wing in standing attempts x10 (some discomfort) Supine active flexion 1# x10 PROM R shoulder all planes S/l ER 1# 2x10 S/l chicken wing x20 Bent over row 3# 2x10 Abduction isometric  5 2x10 Standing bil ER 2x10     06/27/24 Pulleys each flexion and extension Standing wand flexion (modified) ~10 reps Finger ladder with lift off at top x10 Standing press out x10 with 1#, x10 0# Theraband row GTB x30 Chicken wing in standing attempts x5 (unable) Supine active flexion 1# x10 (difficult) PROM R shoulder all planes S/l ER 1# 2x10 (0# last 5 reps) HEP review    06/21/24 Pulleys in flexion and scaption Pt received R shoulder PROM in flexion, scaption, ER, and IR per pt and tissue tolerance.  Supine flexion AROM/AAROM x 10 reps, AROM x 10 reps Attempted wand flexion in a reclined position Supine flexion AROM with head elevated 2x10 S/L ER 2x10 S/L shoulder abduction 2x10 Shoulder submax isometric with 5 sec hold   PATIENT EDUCATION:  Education details: Anatomy of condition, POC, HEP, exercise form/rationale Person educated: Patient Education method: Explanation, Demonstration, Tactile cues, Verbal cues, and Handouts Education comprehension: verbalized understanding, returned demonstration, verbal cues required, tactile cues required, and needs further education   HOME EXERCISE PROGRAM: Access Code: [email or web address] URL: [email or web address] Date: 04/28/2024 Prepared by: Harlene Cordon    ASSESSMENT:  CLINICAL  IMPRESSION:  Pt brought her theracane with her to go over proper usage. Pt felt better after learning different techniques for release tight areas in axilla and scapular areas. She noted improved mobility in shoulder following this. Pt able to complete active shoulder elevation in seated position. She is better able to complete seated flexion with palm down vs neutral. Pt educated in ways to progress active felxion and abduction at home. Will monitor her pain level and progress functional strength as tolerated.  PERSONAL FACTORS: Patient is currently on dialysis 3 days/week are also affecting patient's functional outcome.   REHAB POTENTIAL: Good  CLINICAL DECISION MAKING: Stable/uncomplicated  EVALUATION COMPLEXITY: Low   GOALS: Goals reviewed with patient? Yes  SHORT TERM GOALS: Target date: 05/13/24  Patient will demonstrate full active range of motion of elbow without discomfort and without assistance Baseline: Goal status: MET  2.  Demonstrate full passive range of motion flexion as well as active assisted range of motion on pulleys without increase in pain Baseline:  Goal status: MET  3.  Pain to stay below 5 out of 10 with daily activities Baseline:  Goal status: MET  4.  Independent in self-care as mom will be leaving Baseline:  Goal status: met- can perform self care but has 37 yo daughter who is autistic and requires care    LONG TERM GOALS: Target date: POC date  Full active range of motion of shoulder without compensatory patterns and without impingement Baseline:  Goal status: ongoing 1/8, limited in flexion and abd by pain  2.  UEFS to improve by MDC x 3 Baseline:  Goal status: IN PROGRESS 11/18  3.  Gross shoulder strength to 85% of the opposite upper extremity Baseline:  Goal status: ongoing, not appropriate to test due to inability to move Rt UE against gravity through ROM  4.  Independent and long-term home exercise program for continued strength and  stability Baseline:  Goal status: ongoing 1/8- requires further training    PLAN:  PT FREQUENCY: 1-2x/week  PT DURATION: Plan of care date  PLANNED INTERVENTIONS: 97164- PT Re-evaluation, 97750- Physical Performance Testing, 97110-Therapeutic exercises, 97530- Therapeutic activity, 97112- Neuromuscular re-education, 97535- Self Care, [id number]- Manual therapy, [id number]- Aquatic Therapy, [phone] (1-2 muscles), [id number] (3+ muscles)- Dry Needling, Patient/Family education, Taping, Joint mobilization, Spinal mobilization, Scar mobilization, and Cryotherapy.  PLAN FOR NEXT SESSION: continue with SCM and clavicular mobs PRN, AROM against gravity.  Cont per protocol.  Asberry Rodes, PTA  08/17/24 3:04 PM      "

## 2024-08-21 ENCOUNTER — Ambulatory Visit: Admitting: Podiatry

## 2024-08-22 ENCOUNTER — Ambulatory Visit (HOSPITAL_BASED_OUTPATIENT_CLINIC_OR_DEPARTMENT_OTHER)

## 2024-08-22 ENCOUNTER — Encounter (HOSPITAL_BASED_OUTPATIENT_CLINIC_OR_DEPARTMENT_OTHER): Payer: Self-pay

## 2024-08-22 ENCOUNTER — Encounter (HOSPITAL_BASED_OUTPATIENT_CLINIC_OR_DEPARTMENT_OTHER): Admitting: Physical Therapy

## 2024-08-22 DIAGNOSIS — M6281 Muscle weakness (generalized): Secondary | ICD-10-CM

## 2024-08-22 DIAGNOSIS — M25611 Stiffness of right shoulder, not elsewhere classified: Secondary | ICD-10-CM | POA: Diagnosis not present

## 2024-08-22 DIAGNOSIS — M25511 Pain in right shoulder: Secondary | ICD-10-CM

## 2024-08-22 NOTE — Therapy (Signed)
 " OUTPATIENT PHYSICAL THERAPY THERAPY    Patient Name: Cindy Luna MRN: [id number] DOB:03-Nov-1980, 44 y.o., female Today's Date: 08/22/2024  END OF SESSION:  PT End of Session - 08/22/24 1459     Visit Number 18    Number of Visits 20    Date for Recertification  08/26/24    Authorization Type [employer] Dual Complete    Progress Note Due on Visit 20    PT Start Time 1304    PT Stop Time 1354    PT Time Calculation (min) 50 min    Activity Tolerance Patient tolerated treatment well    Behavior During Therapy [facility] for tasks assessed/performed                       Past Medical History:  Diagnosis Date   Allergy    Anemia    Anxiety    Depression    DUB (dysfunctional uterine bleeding) 06/24/2016   End stage renal disease (HCC)    Dialysis Mon-Wed-Fri   Fatty liver    GERD (gastroesophageal reflux disease)    HLD (hyperlipidemia)    HTN (hypertension) 2007   with pregnancy but resolved after delivery   Lupus    Osteoarthritis    shoulders   Ovarian cyst 09/01/2016   Peripheral vascular disease    Pneumonia    x 1   Sleep apnea    Past Surgical History:  Procedure Laterality Date   AV FISTULA PLACEMENT  2016 right, 2009 left   left and right are both working    BICEPT TENODESIS Right 04/25/2024   Procedure: TENODESIS, BICEPS;  Surgeon: Genelle Standing, MD;  Location: [facility] OR;  Service: Orthopedics;  Laterality: Right;   CESAREAN SECTION     x 1   COLONOSCOPY     OTHER SURGICAL HISTORY Left    AV fistula stents, arm   SHOULDER INJECTION Left 04/25/2024   Procedure: INJECTION, SHOULDER;  Surgeon: Genelle Standing, MD;  Location: [facility] OR;  Service: Orthopedics;  Laterality: Left;  LEFT GLENOHUMERAL INJECTION   TUBAL LIGATION  2010   UPPER GI ENDOSCOPY     Patient Active Problem List   Diagnosis Date Noted   Influenza A 07/08/2024   Biceps tendinitis of right upper extremity 04/25/2024   Arthrosis of right acromioclavicular joint 04/25/2024   Tendinitis  of right rotator cuff 04/25/2024   Rotator cuff tendonitis, left 04/25/2024   Unilateral primary osteoarthritis, right hip 01/27/2024   DM (diabetes mellitus) (HCC)    Chronic constipation 11/16/2019   Eczema 11/16/2019   Environmental and seasonal allergies 07/17/2019   History of depression 07/17/2019   Patient is Jehovah's Witness 07/13/2019   Chest tightness 01/04/2019   Dysmenorrhea 05/03/2018   Class 3 severe obesity with serious comorbidity and body mass index (BMI) of 45.0 to 49.9 in adult (HCC) 04/18/2018   Lymphadenopathy of head and neck 09/29/2017   OSA (obstructive sleep apnea) 12/10/2014   ESRD (end stage renal disease) on dialysis (HCC) 11/19/2014   Lupus (HCC) 11/19/2014     REFERRING PROVIDER: Standing LITTIE Genelle MD   REFERRING DIAG:  M75.81 (ICD-10-CM) - Tendinitis of right rotator cuff     Arthroscopic extensive debridement - 29823 Subdeltoid Bursa, Supraspinatus Tendon, Anterior Labrum, and Superior Labrum Arthroscopic distal clavicle excision - 70175 Arthroscopic subacromial decompression - 70173 Arthroscopic biceps tenodesis - 70171 Rationale for Evaluation and Treatment: Rehabilitation  THERAPY DIAG:  Acute pain of right shoulder  Stiffness of right shoulder,  not elsewhere classified  Muscle weakness (generalized)  ONSET DATE: DOS 04/25/24   SUBJECTIVE:                                                                                                                                                                                           SUBJECTIVE STATEMENT:   Pt reports she has been using theracane and massager at home which is helping. I'm able to raise my arm now!    Previous: I have been out for about a month because I got the flu. I am taking muscle relaxers to ease the muscle cramps- I only take it at night. No cramping  during the day but it wakes me up at night.  I was doing so good before I got sick, I could raise my arm through flexion  without issue. Now it hurts and the whole back of my shoulder blade hurts. I have gotten more comfortable sleeping on that side and I think that is what's making it so sore. Now my left shoulder is giving me problems. I have been using the distilled water as a weight for rows. Doing neck stretches and shoulder blade stretches.    PERTINENT HISTORY:  Dialysis, end-stage renal disease lupus   PAIN:  Are you having pain? No and Yes: NPRS scale: 0/10 at rest, no greater than a 4/10 pain with movement Pain location: anterior right shoulder Pain description: tight Aggravating factors: spasm Relieving factors: rest   PRECAUTIONS:  Dialysis- shoulder has to be still for 4 hours 3 days/week   RED FLAGS: None   WEIGHT BEARING RESTRICTIONS:  No  FALLS:  Has patient fallen in last 6 months? No   OCCUPATION:  Not working  PLOF:  Independent  PATIENT GOALS:  Have my mobility back  OBJECTIVE:  Note: Objective measures were completed at Evaluation unless otherwise noted.   PATIENT SURVEYS:  UEFS  Extreme difficulty/unable (0), Quite a bit of difficulty (1), Moderate difficulty (2), Little difficulty (3), No difficulty (4) Survey date:  10/3 EVAL 11/4 11/18 08/03/24  Any of your usual work, household or school activities 0 1 1 3   2. Your usual hobbies, recreational/sport activities 0 1 3 4    3. Lifting a bag of groceries to waist level 0 1 1 3    4. Lifting a bag of groceries above your head 0 0 0 1  5. Grooming your hair 0 2 2 3   6. Pushing up on your hands (I.e. from bathtub or chair) 0 1 1 3   7. Preparing food (I.e. peeling/cutting) 0 2 1 4   8. Driving  0 1 1 3  9. Vacuuming, sweeping, or raking 0 1 2 4   10. Dressing  1 1 4 4   11. Doing up buttons 3 4 1 4   12. Using tools/appliances 0 1 1 4   13. Opening doors 0 1 4 4   14. Cleaning  0 1 1 3   15. Tying or lacing shoes 1 4 4 4   16. Sleeping  2 2 1 2   17. Laundering clothes (I.e. washing, ironing, folding) 0 2 3 4   18. Opening  a jar 1 1 3 3   19. Throwing a ball 0 1 1 3   20. Carrying a small suitcase with your affected limb.  0 0 0 3  Score total:  8/80 28/80 34/80 66/80      POSTURE:  Eval: Rounded shoulder in sling as expected post op  HAND DOMINANCE:  Right   Body Part #1 Shoulder  PALPATION: EVAL: no s/s of infection  UPPER EXTREMITY ROM:  Passive ROM Right eval A/AAROM 11/4 Right R Passive 11/18 Rt AROM/AA 08/03/24  Shoulder flexion 50 in pendulum 65/ 140 on finger ladder 153 96/140  Shoulder extension      Shoulder abduction  50 143 80/140  Shoulder adduction      Shoulder extension      Shoulder internal rotation   51   Shoulder external rotation   75   Elbow flexion WFL     Elbow extension -10 0 -5   Wrist  WFL        (Blank rows = not tested)   UPPER EXTREMITY MMT:  MMT Right 11/18 Left 11/18 Rt 08/03/24  Shoulder flexion 3 4+ 3-/5  Shoulder extension     Shoulder abduction 3- 4- 3-/5  Shoulder adduction     Shoulder extension     Shoulder internal rotation 4+ 5 5  Shoulder external rotation 4+ 5 5  Middle trapezius     Lower trapezius     Elbow flexion 4+ 4+ 5  Elbow extension 4 4+ 5  Wrist flexion     Wrist extension     Wrist ulnar deviation     Wrist radial deviation     Wrist pronation     Wrist supination     Grip strength      (Blank rows = not tested)     TREATMENT DATE:   08/22/24  Seated posterior shoulder rolls x20 Seated scap retraction x20 Seated biceps stretch x3 Seated active flexion 2x10 Seated active abduction (partial range) x8 (challenging) Seated chicken wing 2x10 Pulleys abduction  STM to R UT, LS, pec in seated position Theraband row GTB 2x20 HEP update and review   08/17/24 Reviewed theracane techniques Ball rolls at plinth flex/abd x10ea Bent over row 8# 2x10 Bil ER review Bent over H abd x10 Bent over extension 2x10 IR behind back Trialed post capsule stretch but denied stretch Seated cane flexion x10 Active R  shoulder flexion x10 Active shoulder abduction x8 Seated scap squeeze x20 Pulleys ea flexion and abduction  STM to rhomboids, pec, UT Shoulder rolls posterior rolls 2x15     08/10/24 Seated scap squeeze x20 Pulleys ea flexion and abduction  STM to rhomboids, pec, UT Shoulder rolls posterior rolls 2x15 Seated scap retraction 2x10 Standing ball rolls for shoulder flexion and abduction (green physioball) 2x10ea Seated self assisted flexion x10 HEP update Review of theracane use   08/03/24 Objective testing, functional discussion Review of HEP STM rhomboids  07/04/24 Pulleys each flexion and abduction Wall slide flexion  with lift off at top x10 Standing press out x10 0# Theraband row GTB x30 Chicken wing in standing attempts x10 (some discomfort) Supine active flexion 1# x10 PROM R shoulder all planes S/l ER 1# 2x10 S/l chicken wing x20 Bent over row 3# 2x10 Abduction isometric 5 2x10 Standing bil ER 2x10     06/27/24 Pulleys each flexion and extension Standing wand flexion (modified) ~10 reps Finger ladder with lift off at top x10 Standing press out x10 with 1#, x10 0# Theraband row GTB x30 Chicken wing in standing attempts x5 (unable) Supine active flexion 1# x10 (difficult) PROM R shoulder all planes S/l ER 1# 2x10 (0# last 5 reps) HEP review    06/21/24 Pulleys in flexion and scaption Pt received R shoulder PROM in flexion, scaption, ER, and IR per pt and tissue tolerance.  Supine flexion AROM/AAROM x 10 reps, AROM x 10 reps Attempted wand flexion in a reclined position Supine flexion AROM with head elevated 2x10 S/L ER 2x10 S/L shoulder abduction 2x10 Shoulder submax isometric with 5 sec hold   PATIENT EDUCATION:  Education details: Anatomy of condition, POC, HEP, exercise form/rationale Person educated: Patient Education method: Explanation, Demonstration, Tactile cues, Verbal cues, and Handouts Education comprehension:  verbalized understanding, returned demonstration, verbal cues required, tactile cues required, and needs further education   HOME EXERCISE PROGRAM: Access Code: [email or web address] URL: [email or web address] Date: 04/28/2024 Prepared by: Harlene Cordon    ASSESSMENT:  CLINICAL IMPRESSION:  Pt able to demonstrate near full seated UE flexion without requiring assistance form L UE. She demonstrates fatigue with increased reps, but denied increase in discomfort. Pt more challenged by seated abduction, more due to weakness vs pain. Pt able to achieve full ROM in abduction when completing pulleys. She was instructed in light progressions for HEP to improve strength and endurance. Reviewed postural awareness and NMC with pt. Will continue to progress as tolerated.   PERSONAL FACTORS: Patient is currently on dialysis 3 days/week are also affecting patient's functional outcome.   REHAB POTENTIAL: Good  CLINICAL DECISION MAKING: Stable/uncomplicated  EVALUATION COMPLEXITY: Low   GOALS: Goals reviewed with patient? Yes  SHORT TERM GOALS: Target date: 05/13/24  Patient will demonstrate full active range of motion of elbow without discomfort and without assistance Baseline: Goal status: MET  2.  Demonstrate full passive range of motion flexion as well as active assisted range of motion on pulleys without increase in pain Baseline:  Goal status: MET  3.  Pain to stay below 5 out of 10 with daily activities Baseline:  Goal status: MET  4.  Independent in self-care as mom will be leaving Baseline:  Goal status: met- can perform self care but has 74 yo daughter who is autistic and requires care    LONG TERM GOALS: Target date: POC date  Full active range of motion of shoulder without compensatory patterns and without impingement Baseline:  Goal status: ongoing 1/8, limited in flexion and abd by pain  2.  UEFS to improve by MDC x 3 Baseline:  Goal status: IN PROGRESS  11/18  3.  Gross shoulder strength to 85% of the opposite upper extremity Baseline:  Goal status: ongoing, not appropriate to test due to inability to move Rt UE against gravity through ROM  4.  Independent and long-term home exercise program for continued strength and stability Baseline:  Goal status: ongoing 1/8- requires further training    PLAN:  PT FREQUENCY: 1-2x/week  PT DURATION: Plan of care date  PLANNED INTERVENTIONS:  02835- PT Re-evaluation, 97750- Physical Performance Testing, 97110-Therapeutic exercises, 97530- Therapeutic activity, [id number]- Neuromuscular re-education, 97535- Self Care, 02859- Manual therapy, [phone]- Aquatic Therapy, [phone] (1-2 muscles), 20561 (3+ muscles)- Dry Needling, Patient/Family education, Taping, Joint mobilization, Spinal mobilization, Scar mobilization, and Cryotherapy.  PLAN FOR NEXT SESSION: continue with SCM and clavicular mobs PRN, AROM against gravity.  Cont per protocol.  Asberry Rodes, PTA  08/22/24 3:10 PM      "

## 2024-08-24 ENCOUNTER — Encounter (HOSPITAL_BASED_OUTPATIENT_CLINIC_OR_DEPARTMENT_OTHER): Payer: Self-pay | Admitting: Physical Therapy

## 2024-08-24 ENCOUNTER — Ambulatory Visit (HOSPITAL_BASED_OUTPATIENT_CLINIC_OR_DEPARTMENT_OTHER): Admitting: Orthopaedic Surgery

## 2024-08-24 ENCOUNTER — Ambulatory Visit: Admitting: Podiatry

## 2024-08-24 ENCOUNTER — Encounter (HOSPITAL_BASED_OUTPATIENT_CLINIC_OR_DEPARTMENT_OTHER): Admitting: Physical Therapy

## 2024-08-24 ENCOUNTER — Ambulatory Visit (HOSPITAL_BASED_OUTPATIENT_CLINIC_OR_DEPARTMENT_OTHER): Admitting: Physical Therapy

## 2024-08-24 DIAGNOSIS — M25611 Stiffness of right shoulder, not elsewhere classified: Secondary | ICD-10-CM | POA: Diagnosis not present

## 2024-08-24 DIAGNOSIS — M25511 Pain in right shoulder: Secondary | ICD-10-CM

## 2024-08-24 DIAGNOSIS — M7581 Other shoulder lesions, right shoulder: Secondary | ICD-10-CM | POA: Diagnosis not present

## 2024-08-24 DIAGNOSIS — M6281 Muscle weakness (generalized): Secondary | ICD-10-CM

## 2024-08-24 NOTE — Therapy (Signed)
 " OUTPATIENT PHYSICAL THERAPY THERAPY    Patient Name: Cindy Luna MRN: [id number] DOB:[birth year], 44 y.o., female Today's Date: 08/24/2024  END OF SESSION:  PT End of Session - 08/24/24 1303     Visit Number 19    Number of Visits 20    Date for Recertification  08/26/24    Authorization Type [employer] Dual Complete    Progress Note Due on Visit 20    PT Start Time 1300    PT Stop Time 1347    PT Time Calculation (min) 47 min    Activity Tolerance Patient tolerated treatment well    Behavior During Therapy [facility] for tasks assessed/performed                       Past Medical History:  Diagnosis Date   Allergy    Anemia    Anxiety    Depression    DUB (dysfunctional uterine bleeding) 06/24/2016   End stage renal disease (HCC)    Dialysis Mon-Wed-Fri   Fatty liver    GERD (gastroesophageal reflux disease)    HLD (hyperlipidemia)    HTN (hypertension) 2007   with pregnancy but resolved after delivery   Lupus    Osteoarthritis    shoulders   Ovarian cyst 09/01/2016   Peripheral vascular disease    Pneumonia    x 1   Sleep apnea    Past Surgical History:  Procedure Laterality Date   AV FISTULA PLACEMENT  2016 right, 2009 left   left and right are both working    BICEPT TENODESIS Right 04/25/2024   Procedure: TENODESIS, BICEPS;  Surgeon: Genelle Standing, MD;  Location: [facility] OR;  Service: Orthopedics;  Laterality: Right;   CESAREAN SECTION     x 1   COLONOSCOPY     OTHER SURGICAL HISTORY Left    AV fistula stents, arm   SHOULDER INJECTION Left 04/25/2024   Procedure: INJECTION, SHOULDER;  Surgeon: Genelle Standing, MD;  Location: [facility] OR;  Service: Orthopedics;  Laterality: Left;  LEFT GLENOHUMERAL INJECTION   TUBAL LIGATION  2010   UPPER GI ENDOSCOPY     Patient Active Problem List   Diagnosis Date Noted   Influenza A 07/08/2024   Biceps tendinitis of right upper extremity 04/25/2024   Arthrosis of right acromioclavicular joint 04/25/2024   Tendinitis  of right rotator cuff 04/25/2024   Rotator cuff tendonitis, left 04/25/2024   Unilateral primary osteoarthritis, right hip 01/27/2024   DM (diabetes mellitus) (HCC)    Chronic constipation 11/16/2019   Eczema 11/16/2019   Environmental and seasonal allergies 07/17/2019   History of depression 07/17/2019   Patient is Jehovah's Witness 07/13/2019   Chest tightness 01/04/2019   Dysmenorrhea 05/03/2018   Class 3 severe obesity with serious comorbidity and body mass index (BMI) of 45.0 to 49.9 in adult (HCC) 04/18/2018   Lymphadenopathy of head and neck 09/29/2017   OSA (obstructive sleep apnea) 12/10/2014   ESRD (end stage renal disease) on dialysis (HCC) 11/19/2014   Lupus (HCC) 11/19/2014     REFERRING PROVIDER: Standing LITTIE Genelle MD   REFERRING DIAG:  M75.81 (ICD-10-CM) - Tendinitis of right rotator cuff     Arthroscopic extensive debridement - 29823 Subdeltoid Bursa, Supraspinatus Tendon, Anterior Labrum, and Superior Labrum Arthroscopic distal clavicle excision - 70175 Arthroscopic subacromial decompression - 70173 Arthroscopic biceps tenodesis - 70171 Rationale for Evaluation and Treatment: Rehabilitation  THERAPY DIAG:  Acute pain of right shoulder  Stiffness of right shoulder,  not elsewhere classified  Muscle weakness (generalized)  ONSET DATE: DOS 04/25/24   SUBJECTIVE:                                                                                                                                                                                           SUBJECTIVE STATEMENT:  I feel like I just dont have strength to bring arm out to the side. Rubs proximal biceps when demonstrating difficulty reaching up and grabbing something to bring it back down.  I go through the cramping during the day and only take muscle relaxers at night.  I cannot sleep on Rt shoulder because it gets so stiff.  Something on the back of my shoulder blade, there is a sharp pain when I sleep.   Cannot reach behind neck or low back.   Previous: I have been out for about a month because I got the flu. I am taking muscle relaxers to ease the muscle cramps- I only take it at night. No cramping  during the day but it wakes me up at night.  I was doing so good before I got sick, I could raise my arm through flexion without issue. Now it hurts and the whole back of my shoulder blade hurts. I have gotten more comfortable sleeping on that side and I think that is what's making it so sore. Now my left shoulder is giving me problems. I have been using the distilled water as a weight for rows. Doing neck stretches and shoulder blade stretches.    PERTINENT HISTORY:  Dialysis, end-stage renal disease lupus   PAIN:  Are you having pain? No and Yes: NPRS scale: 0/10 at rest, no greater than a 4/10 pain with movement Pain location: anterior right shoulder Pain description: tight Aggravating factors: spasm Relieving factors: rest   PRECAUTIONS:  Dialysis- shoulder has to be still for 4 hours 3 days/week   RED FLAGS: None   WEIGHT BEARING RESTRICTIONS:  No  FALLS:  Has patient fallen in last 6 months? No   OCCUPATION:  Not working  PLOF:  Independent  PATIENT GOALS:  Have my mobility back  OBJECTIVE:  Note: Objective measures were completed at Evaluation unless otherwise noted.   PATIENT SURVEYS:  UEFS  Extreme difficulty/unable (0), Quite a bit of difficulty (1), Moderate difficulty (2), Little difficulty (3), No difficulty (4) Survey date:  10/3 EVAL 11/4 11/18 08/03/24  Any of your usual work, household or school activities 0 1 1 3   2. Your usual hobbies, recreational/sport activities 0 1 3 4    3. Lifting a bag of groceries to waist level 0 1  1 3   4. Lifting a bag of groceries above your head 0 0 0 1  5. Grooming your hair 0 2 2 3   6. Pushing up on your hands (I.e. from bathtub or chair) 0 1 1 3   7. Preparing food (I.e. peeling/cutting) 0 2 1 4   8. Driving  0 1 1 3    9. Vacuuming, sweeping, or raking 0 1 2 4   10. Dressing  1 1 4 4   11. Doing up buttons 3 4 1 4   12. Using tools/appliances 0 1 1 4   13. Opening doors 0 1 4 4   14. Cleaning  0 1 1 3   15. Tying or lacing shoes 1 4 4 4   16. Sleeping  2 2 1 2   17. Laundering clothes (I.e. washing, ironing, folding) 0 2 3 4   18. Opening a jar 1 1 3 3   19. Throwing a ball 0 1 1 3   20. Carrying a small suitcase with your affected limb.  0 0 0 3  Score total:  8/80 28/80 34/80 66/80      POSTURE:  Eval: Rounded shoulder in sling as expected post op  HAND DOMINANCE:  Right   Body Part #1 Shoulder  PALPATION: EVAL: no s/s of infection  UPPER EXTREMITY ROM:  Passive ROM Right eval A/AAROM 11/4 Right R Passive 11/18 Rt AROM/AA 08/03/24  Shoulder flexion 50 in pendulum 65/ 140 on finger ladder 153 96/140  Shoulder extension      Shoulder abduction  50 143 80/140  Shoulder adduction      Shoulder extension      Shoulder internal rotation   51   Shoulder external rotation   75   Elbow flexion WFL     Elbow extension -10 0 -5   Wrist  WFL        (Blank rows = not tested)   UPPER EXTREMITY MMT:  MMT Right 11/18 Left 11/18 Rt 08/03/24 Rt/Lt 1/29 (lb)  Shoulder flexion 3 4+ 3-/5 8.8/18.1  Shoulder extension    31.0/25.5  Shoulder abduction 3- 4- 3-/5 5.6/18.5  Shoulder adduction      Shoulder extension      Shoulder internal rotation 4+ 5 5 17.2/17.6  Shoulder external rotation 4+ 5 5 14.8/12.4  Middle trapezius      Lower trapezius      Elbow flexion 4+ 4+ 5   Elbow extension 4 4+ 5    (Blank rows = not tested)     TREATMENT DATE:  08/24/24 Bent over IYT, added 2lb Abd lift from wall at 90 deg Standing posture- shift weight to heels and post pelvic tilt Standing abd full range with chair in front to reduce anterior pelvic shift- was able to independently lift against gravity to 120 deg  Transverse abdominis activation in seated   08/22/24  Seated posterior shoulder rolls  x20 Seated scap retraction x20 Seated biceps stretch x3 Seated active flexion 2x10 Seated active abduction (partial range) x8 (challenging) Seated chicken wing 2x10 Pulleys abduction  STM to R UT, LS, pec in seated position Theraband row GTB 2x20 HEP update and review   08/17/24 Reviewed theracane techniques Ball rolls at plinth flex/abd x10ea Bent over row 8# 2x10 Bil ER review Bent over H abd x10 Bent over extension 2x10 IR behind back Trialed post capsule stretch but denied stretch Seated cane flexion x10 Active R shoulder flexion x10 Active shoulder abduction x8 Seated scap squeeze x20 Pulleys ea flexion and abduction  STM  to rhomboids, pec, UT Shoulder rolls posterior rolls 2x15   PATIENT EDUCATION:  Education details: Anatomy of condition, POC, HEP, exercise form/rationale Person educated: Patient Education method: Explanation, Demonstration, Tactile cues, Verbal cues, and Handouts Education comprehension: verbalized understanding, returned demonstration, verbal cues required, tactile cues required, and needs further education   HOME EXERCISE PROGRAM: Access Code: [email or web address] URL: [email or web address] Date: 04/28/2024 Prepared by: Harlene Cordon    ASSESSMENT:  CLINICAL IMPRESSION:  Was able to successfully abduct UE to 120 deg today with focus on core and postural alignment. Will benefit from further strengthening and stability in proper posture.   PERSONAL FACTORS: Patient is currently on dialysis 3 days/week are also affecting patient's functional outcome.   REHAB POTENTIAL: Good  CLINICAL DECISION MAKING: Stable/uncomplicated  EVALUATION COMPLEXITY: Low   GOALS: Goals reviewed with patient? Yes  SHORT TERM GOALS: Target date: 05/13/24  Patient will demonstrate full active range of motion of elbow without discomfort and without assistance Baseline: Goal status: MET  2.  Demonstrate full passive range of motion  flexion as well as active assisted range of motion on pulleys without increase in pain Baseline:  Goal status: MET  3.  Pain to stay below 5 out of 10 with daily activities Baseline:  Goal status: MET  4.  Independent in self-care as mom will be leaving Baseline:  Goal status: met- can perform self care but has 14 yo daughter who is autistic and requires care    LONG TERM GOALS: Target date: POC date  Full active range of motion of shoulder without compensatory patterns and without impingement Baseline:  Goal status: ongoing 1/8, limited in flexion and abd by weakness  2.  UEFS to improve by MDC x 3 Baseline:  Goal status: ongoing, see obj  3.  Gross shoulder strength to 85% of the opposite upper extremity Baseline:  Goal status: ongoing, see obj  4.  Independent and long-term home exercise program for continued strength and stability Baseline:  Goal status: ongoing 1/8- requires further training    PLAN:  PT FREQUENCY: 1-2x/week  PT DURATION: Plan of care date  PLANNED INTERVENTIONS: 97164- PT Re-evaluation, 97750- Physical Performance Testing, 97110-Therapeutic exercises, 97530- Therapeutic activity, 97112- Neuromuscular re-education, 97535- Self Care, 02859- Manual therapy, [id number]- Aquatic Therapy, [phone] (1-2 muscles), 20561 (3+ muscles)- Dry Needling, Patient/Family education, Taping, Joint mobilization, Spinal mobilization, Scar mobilization, and Cryotherapy.  PLAN FOR NEXT SESSION: continue with SCM and clavicular mobs PRN, AROM against gravity.  Cont per protocol.  Elisah Parmer C. Antonino Nienhuis PT, DPT 08/24/24 1:51 PM       "

## 2024-08-24 NOTE — Progress Notes (Signed)
 "                                Post Operative Evaluation    Procedure/Date of Surgery: Right shoulder arthroscopy with biceps tenodesis 9/30  Interval History:   Presents 16 weeks status post the above procedure.  Overall she is doing extremely well.  Overhead range of motion is improving nicely.  She did ask to delay therapy as a result of having an upper respiratory illness   PMH/PSH/Family History/Social History/Meds/Allergies:    Past Medical History:  Diagnosis Date   Allergy    Anemia    Anxiety    Depression    DUB (dysfunctional uterine bleeding) 06/24/2016   End stage renal disease (HCC)    Dialysis Mon-Wed-Fri   Fatty liver    GERD (gastroesophageal reflux disease)    HLD (hyperlipidemia)    HTN (hypertension) 2007   with pregnancy but resolved after delivery   Lupus    Osteoarthritis    shoulders   Ovarian cyst 09/01/2016   Peripheral vascular disease    Pneumonia    x 1   Sleep apnea    Past Surgical History:  Procedure Laterality Date   AV FISTULA PLACEMENT  2016 right, 2009 left   left and right are both working    BICEPT TENODESIS Right 04/25/2024   Procedure: TENODESIS, BICEPS;  Surgeon: Genelle Standing, MD;  Location: [facility] OR;  Service: Orthopedics;  Laterality: Right;   CESAREAN SECTION     x 1   COLONOSCOPY     OTHER SURGICAL HISTORY Left    AV fistula stents, arm   SHOULDER INJECTION Left 04/25/2024   Procedure: INJECTION, SHOULDER;  Surgeon: Genelle Standing, MD;  Location: [facility] OR;  Service: Orthopedics;  Laterality: Left;  LEFT GLENOHUMERAL INJECTION   TUBAL LIGATION  2010   UPPER GI ENDOSCOPY     Social History   Socioeconomic History   Marital status: Single    Spouse name: Not on file   Number of children: 1   Years of education: Not on file   Highest education level: Bachelor's degree (e.g., BA, AB, BS)  Occupational History   Not on file  Tobacco Use   Smoking status: Former    Types: Cigars    Start date: 2018    Quit date:  07/2018    Years since quitting: 6.0   Smokeless tobacco: Never  Vaping Use   Vaping status: Never Used  Substance and Sexual Activity   Alcohol use: No   Drug use: No   Sexual activity: Not Currently    Partners: Male    Birth control/protection: Surgical    Comment: tubal ligation  Other Topics Concern   Not on file  Social History Narrative   ** Merged History Encounter **       Level of education: college    Employment: unemployed    Transportation: car    Exercise: no   Housing situation: apt   Relationships (safe): yes   Contact for message (voicemail): [phone]   Social Drivers of Health   Tobacco Use: Medium Risk (08/22/2024)   Patient History    Smoking Tobacco Use: Former    Smokeless Tobacco Use: Never    Passive Exposure: Not on Actuary Strain: Low Risk (01/18/2024)   Overall Financial Resource Strain (CARDIA)    Difficulty of Paying Living Expenses: Not very hard  Food Insecurity: No  Food Insecurity (07/08/2024)   Epic    Worried About Programme Researcher, Broadcasting/film/video in the Last Year: Never true    Ran Out of Food in the Last Year: Never true  Transportation Needs: No Transportation Needs (07/08/2024)   Epic    Lack of Transportation (Medical): No    Lack of Transportation (Non-Medical): No  Physical Activity: Inactive (01/18/2024)   Exercise Vital Sign    Days of Exercise per Week: 0 days    Minutes of Exercise per Session: Not on file  Stress: No Stress Concern Present (01/18/2024)   [employer] of Occupational Health - Occupational Stress Questionnaire    Feeling of Stress: Only a little  Social Connections: Unknown (07/07/2024)   Social Connection and Isolation Panel    Frequency of Communication with Friends and Family: Not on file    Frequency of Social Gatherings with Friends and Family: Not on file    Attends Religious Services: More than 4 times per year    Active Member of Clubs or Organizations: Yes    Attends Tax Inspector Meetings: More than 4 times per year    Marital Status: Not on file  Depression (PHQ2-9): Low Risk (02/24/2024)   Depression (PHQ2-9)    PHQ-2 Score: 2  Alcohol Screen: Low Risk (08/12/2023)   Alcohol Screen    Last Alcohol Screening Score (AUDIT): 0  Housing: Low Risk (07/08/2024)   Epic    Unable to Pay for Housing in the Last Year: No    Number of Times Moved in the Last Year: 0    Homeless in the Last Year: No  Utilities: Not At Risk (07/08/2024)   Epic    Threatened with loss of utilities: No  Health Literacy: Adequate Health Literacy (08/12/2023)   B1300 Health Literacy    Frequency of need for help with medical instructions: Never   Family History  Problem Relation Age of Onset   Drug abuse Father    Early death Father    Hypertension Mother    Diabetes Mother    Autism Daughter    ADD / ADHD Daughter    Learning disabilities Daughter    Thyroid  disease Sister    Colon cancer Maternal Grandmother    Cancer Maternal Grandmother    Prostate cancer Maternal Grandfather    Cancer Maternal Grandfather    Stroke Maternal Grandfather    Breast cancer Maternal Aunt    Hypertension Maternal Aunt    Diabetes Other        both sides of the fam   Hypertension Other        both sides of the fam   Alcohol abuse Maternal Uncle    Cancer Maternal Aunt    Depression Maternal Aunt    Diabetes Maternal Aunt    Diabetes Paternal Uncle    Hypertension Paternal Uncle    Allergies  Allergen Reactions   Azithromycin Hives   Benadryl [Diphenhydramine Hcl] Hives   Clindamycin /Lincomycin Itching    Redness   Doxycycline Hyclate    Vancomycin Swelling   Adhesive [Tape] Rash   Current Outpatient Medications  Medication Sig Dispense Refill   albuterol  (VENTOLIN  HFA) 108 (90 Base) MCG/ACT inhaler Inhale 2 puffs into the lungs every 6 (six) hours as needed for wheezing or shortness of breath. 8 g 1   cetirizine (ZYRTEC) 10 MG tablet Take 10 mg by mouth daily.      cinacalcet (SENSIPAR) 60 MG tablet Take 120 mg by mouth daily.  docusate sodium (COLACE) 100 MG capsule Take 300 mg by mouth daily.     famotidine  (PEPCID ) 20 MG tablet Take 1 tablet (20 mg total) by mouth daily as directed for heartburn. (Patient taking differently: Take 20 mg by mouth at bedtime.) 90 tablet 3   linaclotide  (LINZESS ) 145 MCG CAPS capsule Take 1 capsule (145 mcg total) by mouth daily as needed. 90 capsule 2   norethindrone (AYGESTIN) 5 MG tablet Take 5 mg by mouth.     ondansetron  (ZOFRAN ) 4 MG tablet Take 1 tablet (4 mg total) by mouth every 6 (six) hours as needed for nausea. 20 tablet 0   oseltamivir  (TAMIFLU ) 30 MG capsule Take 1 capsule (30 mg total) by mouth every Monday, Wednesday, and Friday with hemodialysis. 3 capsule 0   oxyCODONE  (ROXICODONE ) 5 MG immediate release tablet Take 1 tablet (5 mg total) by mouth every 4 (four) hours as needed for severe pain (pain score 7-10) or breakthrough pain. 20 tablet 0   phenol (CHLORASEPTIC) 1.4 % LIQD Use as directed 1 spray in the mouth or throat as needed for throat irritation / pain.     sevelamer carbonate (RENVELA) 800 MG tablet Take 800 mg by mouth. Take 5 tablets three times daily with meal and 2 tablets two times with snack     No current facility-administered medications for this visit.   No results found.  Review of Systems:   A ROS was performed including pertinent positives and negatives as documented in the HPI.   Musculoskeletal Exam:    There were no vitals taken for this visit.  Right shoulder incisions are well-appearing without erythema or drainage.  Active forward elevation is to approximately 145 degrees although passively is to 90 in the spine position with external rotation at side to 30 degrees distal neurosensory seems intact  Imaging:      I personally reviewed and interpreted the radiographs.   Assessment:   4 months status post right shoulder arthroscopy with biceps tenodesis doing well.   At this time she will continue to progress with physical therapy and I will see her back in 8 weeks for reassessment  Plan :    - Return to clinic 8 weeks for reassessment      I personally saw and evaluated the patient, and participated in the management and treatment plan.  Elspeth Parker, MD Attending Physician, Orthopedic Surgery  This document was dictated using Dragon voice recognition software. A reasonable attempt at proof reading has been made to minimize errors. "

## 2024-08-31 ENCOUNTER — Encounter (HOSPITAL_BASED_OUTPATIENT_CLINIC_OR_DEPARTMENT_OTHER): Payer: Self-pay | Admitting: Physical Therapy

## 2024-08-31 ENCOUNTER — Ambulatory Visit (HOSPITAL_BASED_OUTPATIENT_CLINIC_OR_DEPARTMENT_OTHER): Attending: Orthopaedic Surgery | Admitting: Physical Therapy

## 2024-08-31 DIAGNOSIS — M25611 Stiffness of right shoulder, not elsewhere classified: Secondary | ICD-10-CM

## 2024-08-31 DIAGNOSIS — M25511 Pain in right shoulder: Secondary | ICD-10-CM

## 2024-08-31 DIAGNOSIS — M6281 Muscle weakness (generalized): Secondary | ICD-10-CM

## 2024-08-31 NOTE — Therapy (Signed)
 " OUTPATIENT PHYSICAL THERAPY TX AND RECERT    Patient Name: Cindy Luna MRN: [id number] DOB:1981/01/17, 44 y.o., female Today's Date: 08/31/2024   Progress Note Reporting Period 06/14/24 to 08/31/24  See note below for Objective Data and Assessment of Progress/Goals.         END OF SESSION:  PT End of Session - 08/31/24 1045     Visit Number 20    Number of Visits 36    Date for Recertification  10/26/24    Authorization Type [employer] Dual Complete    Progress Note Due on Visit 30    PT Start Time 1020    PT Stop Time 1058    PT Time Calculation (min) 38 min    Activity Tolerance Patient tolerated treatment well    Behavior During Therapy [facility] for tasks assessed/performed                        Past Medical History:  Diagnosis Date   Allergy    Anemia    Anxiety    Depression    DUB (dysfunctional uterine bleeding) 06/24/2016   End stage renal disease (HCC)    Dialysis Mon-Wed-Fri   Fatty liver    GERD (gastroesophageal reflux disease)    HLD (hyperlipidemia)    HTN (hypertension) 2007   with pregnancy but resolved after delivery   Lupus    Osteoarthritis    shoulders   Ovarian cyst 09/01/2016   Peripheral vascular disease    Pneumonia    x 1   Sleep apnea    Past Surgical History:  Procedure Laterality Date   AV FISTULA PLACEMENT  2016 right, 2009 left   left and right are both working    BICEPT TENODESIS Right 04/25/2024   Procedure: TENODESIS, BICEPS;  Surgeon: Genelle Standing, MD;  Location: [facility] OR;  Service: Orthopedics;  Laterality: Right;   CESAREAN SECTION     x 1   COLONOSCOPY     OTHER SURGICAL HISTORY Left    AV fistula stents, arm   SHOULDER INJECTION Left 04/25/2024   Procedure: INJECTION, SHOULDER;  Surgeon: Genelle Standing, MD;  Location: [facility] OR;  Service: Orthopedics;  Laterality: Left;  LEFT GLENOHUMERAL INJECTION   TUBAL LIGATION  2010   UPPER GI ENDOSCOPY     Patient Active Problem List   Diagnosis Date Noted    Influenza A 07/08/2024   Biceps tendinitis of right upper extremity 04/25/2024   Arthrosis of right acromioclavicular joint 04/25/2024   Tendinitis of right rotator cuff 04/25/2024   Rotator cuff tendonitis, left 04/25/2024   Unilateral primary osteoarthritis, right hip 01/27/2024   DM (diabetes mellitus) (HCC)    Chronic constipation 11/16/2019   Eczema 11/16/2019   Environmental and seasonal allergies 07/17/2019   History of depression 07/17/2019   Patient is Jehovah's Witness 07/13/2019   Chest tightness 01/04/2019   Dysmenorrhea 05/03/2018   Class 3 severe obesity with serious comorbidity and body mass index (BMI) of 45.0 to 49.9 in adult (HCC) 04/18/2018   Lymphadenopathy of head and neck 09/29/2017   OSA (obstructive sleep apnea) 12/10/2014   ESRD (end stage renal disease) on dialysis (HCC) 11/19/2014   Lupus (HCC) 11/19/2014     REFERRING PROVIDER: Standing LITTIE Genelle MD   REFERRING DIAG:  M75.81 (ICD-10-CM) - Tendinitis of right rotator cuff     Arthroscopic extensive debridement - 29823 Subdeltoid Bursa, Supraspinatus Tendon, Anterior Labrum, and Superior Labrum Arthroscopic distal clavicle excision - 70175  Arthroscopic subacromial decompression - 70173 Arthroscopic biceps tenodesis - 70171 Rationale for Evaluation and Treatment: Rehabilitation  THERAPY DIAG:  Acute pain of right shoulder  Stiffness of right shoulder, not elsewhere classified  Muscle weakness (generalized)  ONSET DATE: DOS 04/25/24   SUBJECTIVE:                                                                                                                                                                                           SUBJECTIVE STATEMENT:  Shoulder is feeling stiff, cold weather isn't really helping me much at all. Trying to keep it in motion as much as possible, also using theracane and massager. Hard to reach [facility] or get things out of cabinets, its hard to reach behind my back as well.  Would say I'm at 67% with biggest concerns being strength and dressing/ADLs.   Previous: I have been out for about a month because I got the flu. I am taking muscle relaxers to ease the muscle cramps- I only take it at night. No cramping  during the day but it wakes me up at night.  I was doing so good before I got sick, I could raise my arm through flexion without issue. Now it hurts and the whole back of my shoulder blade hurts. I have gotten more comfortable sleeping on that side and I think that is what's making it so sore. Now my left shoulder is giving me problems. I have been using the distilled water as a weight for rows. Doing neck stretches and shoulder blade stretches.    PERTINENT HISTORY:  Dialysis, end-stage renal disease lupus   PAIN:  Are you having pain? No and Yes: NPRS scale: 0/10 at rest, at worst in past week 3-4/10 but I don't let it get higher than that  Pain location: under arm and across top/back of R shoulder  Pain description: tight Aggravating factors: spasm Relieving factors: rest   PRECAUTIONS:  Dialysis- shoulder has to be still for 4 hours 3 days/week   RED FLAGS: None   WEIGHT BEARING RESTRICTIONS:  No  FALLS:  Has patient fallen in last 6 months? No   OCCUPATION:  Not working  PLOF:  Independent  PATIENT GOALS:  Have my mobility back  OBJECTIVE:  Note: Objective measures were completed at Evaluation unless otherwise noted.   PATIENT SURVEYS:  UEFS  Extreme difficulty/unable (0), Quite a bit of difficulty (1), Moderate difficulty (2), Little difficulty (3), No difficulty (4) Survey date:  10/3 EVAL 11/4 11/18 08/03/24 08/31/24  Any of your usual work, household or school activities 0 1 1 3 3   2. Your  usual hobbies, recreational/sport activities 0 1 3 4 4    3. Lifting a bag of groceries to waist level 0 1 1 3 4    4. Lifting a bag of groceries above your head 0 0 0 1 0  5. Grooming your hair 0 2 2 3 3   6. Pushing up on your hands (I.e.  from bathtub or chair) 0 1 1 3 1   7. Preparing food (I.e. peeling/cutting) 0 2 1 4 4   8. Driving  0 1 1 3 3   9. Vacuuming, sweeping, or raking 0 1 2 4 4   10. Dressing  1 1 4 4 3   11. Doing up buttons 3 4 1 4 4   12. Using tools/appliances 0 1 1 4 4   13. Opening doors 0 1 4 4 4   14. Cleaning  0 1 1 3 3   15. Tying or lacing shoes 1 4 4 4 4   16. Sleeping  2 2 1 2 1   17. Laundering clothes (I.e. washing, ironing, folding) 0 2 3 4 3   18. Opening a jar 1 1 3 3 3   19. Throwing a ball 0 1 1 3 4   20. Carrying a small suitcase with your affected limb.  0 0 0 3 3  Score total:  8/80 28/80 34/80 66/80  62     POSTURE:  Eval: Rounded shoulder in sling as expected post op  HAND DOMINANCE:  Right   Body Part #1 Shoulder  PALPATION: EVAL: no s/s of infection  UPPER EXTREMITY ROM:  Passive ROM Right eval A/AAROM 11/4 Right R Passive 11/18 Rt AROM/AA 08/03/24 R 08/31/24 supine   Shoulder flexion 50 in pendulum 65/ 140 on finger ladder 153 96/140 AROM/AA 150 (with a little help to get initially started)  Shoulder extension       Shoulder abduction  50 143 80/140 AROM 124*  Shoulder adduction       Shoulder extension       Shoulder internal rotation   51  ~70* AROM  Shoulder external rotation   75  ~80* AROM  Elbow flexion WFL      Elbow extension -10 0 -5    Wrist  WFL         (Blank rows = not tested)   UPPER EXTREMITY MMT:  MMT Right 11/18 Left 11/18 Rt 08/03/24 Rt/Lt 1/29 (lb) R 08/31/24 L 08/31/24  Shoulder flexion 3 4+ 3-/5 8.8/18.1 4- 4+  Shoulder extension    31.0/25.5    Shoulder abduction 3- 4- 3-/5 5.6/18.5 3- 4+  Shoulder adduction        Shoulder extension        Shoulder internal rotation 4+ 5 5 17.2/17.6    Shoulder external rotation 4+ 5 5 14.8/12.4    Middle trapezius        Lower trapezius        Elbow flexion 4+ 4+ 5     Elbow extension 4 4+ 5      (Blank rows = not tested)     TREATMENT DATE:    08/31/24  UEFS, ROM, MMT goals for recert  Education  on progress with PT, POC moving forward- assured her its unlikely a muscle didn't heal improperly as she making forward progress, and everyone heals at a different pace from surgeries   Wall ladder flexion + UE lift off and eccentric lower x12 0# Seated 1# waTe flexion + TA set x10  Bent I 2# x10  Bent T 2# x10 Harrel GRADE  2# x10      PATIENT EDUCATION:  Education details: Anatomy of condition, POC, HEP, exercise form/rationale Person educated: Patient Education method: Explanation, Demonstration, Tactile cues, Verbal cues, and Handouts Education comprehension: verbalized understanding, returned demonstration, verbal cues required, tactile cues required, and needs further education   HOME EXERCISE PROGRAM: Access Code: [email or web address] URL: [email or web address] Date: 04/28/2024 Prepared by: Harlene Cordon    ASSESSMENT:  CLINICAL IMPRESSION:  Arrives today doing well, we updated UEFS and objectives for re-cert this visit. She is making steady forward progress in terms of ROM, strength, and function however does continue to demonstrate considerable measurable deficits in these areas. Having issues with functional tasks such as dressing and OH reaching due to these factors- will extend POC and continue working towards LTGs yet unmet.   PERSONAL FACTORS: Patient is currently on dialysis 3 days/week are also affecting patient's functional outcome.   REHAB POTENTIAL: Good  CLINICAL DECISION MAKING: Stable/uncomplicated  EVALUATION COMPLEXITY: Low   GOALS: Goals reviewed with patient? Yes  SHORT TERM GOALS: Target date: 05/13/24  Patient will demonstrate full active range of motion of elbow without discomfort and without assistance Baseline: Goal status: MET  2.  Demonstrate full passive range of motion flexion as well as active assisted range of motion on pulleys without increase in pain Baseline:  Goal status: MET  3.  Pain to stay below 5 out of 10 with daily  activities Baseline:  Goal status: MET  4.  Independent in self-care as mom will be leaving Baseline:  Goal status: met- can perform self care but has 37 yo daughter who is autistic and requires care    LONG TERM GOALS: Target date: 10/26/2024    Full active range of motion of shoulder without compensatory patterns and without impingement Baseline:  Goal status: ongoing 08/31/24  2.  UEFS to improve by MDC x 3 Baseline:  Goal status: ongoing 08/31/24  3.  Gross shoulder strength to 85% of the opposite upper extremity Baseline:  Goal status: ongoing 08/31/24  4.  Independent and long-term home exercise program for continued strength and stability Baseline:  Goal status: ongoing 08/31/24    PLAN:  PT FREQUENCY: 1-2x/week  PT DURATION: 8 weeks   PLANNED INTERVENTIONS: 97164- PT Re-evaluation, 97750- Physical Performance Testing, 97110-Therapeutic exercises, 97530- Therapeutic activity, 97112- Neuromuscular re-education, 97535- Self Care, 02859- Manual therapy, [phone]- Aquatic Therapy, [phone] (1-2 muscles), [id number] (3+ muscles)- Dry Needling, Patient/Family education, Taping, Joint mobilization, Spinal mobilization, Scar mobilization, and Cryotherapy.  PLAN FOR NEXT SESSION: continue with SCM and clavicular mobs PRN, AROM against gravity.  Cont per protocol. Strengthening as able/tolerated   Josette Rough, PT, DPT 08/31/24 11:00 AM      "

## 2024-09-01 ENCOUNTER — Encounter (HOSPITAL_COMMUNITY): Payer: Self-pay | Admitting: Vascular Surgery

## 2024-09-04 ENCOUNTER — Encounter (HOSPITAL_BASED_OUTPATIENT_CLINIC_OR_DEPARTMENT_OTHER): Payer: Self-pay | Admitting: Physical Therapy

## 2024-09-21 ENCOUNTER — Ambulatory Visit: Admitting: Podiatry

## 2024-09-21 ENCOUNTER — Encounter (HOSPITAL_BASED_OUTPATIENT_CLINIC_OR_DEPARTMENT_OTHER): Admitting: Physical Therapy

## 2024-09-26 ENCOUNTER — Encounter (HOSPITAL_BASED_OUTPATIENT_CLINIC_OR_DEPARTMENT_OTHER)

## 2024-09-28 ENCOUNTER — Encounter (HOSPITAL_BASED_OUTPATIENT_CLINIC_OR_DEPARTMENT_OTHER)

## 2024-10-03 ENCOUNTER — Encounter (HOSPITAL_BASED_OUTPATIENT_CLINIC_OR_DEPARTMENT_OTHER): Admitting: Physical Therapy

## 2024-10-05 ENCOUNTER — Encounter (HOSPITAL_BASED_OUTPATIENT_CLINIC_OR_DEPARTMENT_OTHER)

## 2024-10-05 ENCOUNTER — Ambulatory Visit (HOSPITAL_BASED_OUTPATIENT_CLINIC_OR_DEPARTMENT_OTHER): Admitting: Orthopaedic Surgery

## 2024-10-10 ENCOUNTER — Encounter (HOSPITAL_BASED_OUTPATIENT_CLINIC_OR_DEPARTMENT_OTHER): Admitting: Physical Therapy

## 2024-10-12 ENCOUNTER — Encounter (HOSPITAL_BASED_OUTPATIENT_CLINIC_OR_DEPARTMENT_OTHER): Admitting: Physical Therapy

## 2024-10-17 ENCOUNTER — Encounter (HOSPITAL_BASED_OUTPATIENT_CLINIC_OR_DEPARTMENT_OTHER)

## 2024-10-19 ENCOUNTER — Encounter (HOSPITAL_BASED_OUTPATIENT_CLINIC_OR_DEPARTMENT_OTHER): Admitting: Physical Therapy

## 2024-10-24 ENCOUNTER — Encounter (HOSPITAL_BASED_OUTPATIENT_CLINIC_OR_DEPARTMENT_OTHER): Admitting: Physical Therapy

## 2024-10-26 ENCOUNTER — Encounter (HOSPITAL_BASED_OUTPATIENT_CLINIC_OR_DEPARTMENT_OTHER): Admitting: Physical Therapy

## 2024-11-02 ENCOUNTER — Ambulatory Visit (HOSPITAL_BASED_OUTPATIENT_CLINIC_OR_DEPARTMENT_OTHER): Admitting: Orthopaedic Surgery
